# Patient Record
Sex: Female | Born: 1970
Health system: Southern US, Community
[De-identification: ages and names within clinical notes are randomized; demographics above are authoritative.]

## PROBLEM LIST (undated history)

## (undated) DIAGNOSIS — C801 Malignant (primary) neoplasm, unspecified: Secondary | ICD-10-CM

## (undated) DIAGNOSIS — T7840XA Allergy, unspecified, initial encounter: Secondary | ICD-10-CM

## (undated) HISTORY — PX: TUBAL LIGATION: SHX77

## (undated) HISTORY — DX: Allergy, unspecified, initial encounter: T78.40XA

---

## 1999-03-11 ENCOUNTER — Ambulatory Visit (HOSPITAL_COMMUNITY): Admission: RE | Admit: 1999-03-11 | Discharge: 1999-03-11 | Payer: Self-pay | Admitting: *Deleted

## 1999-03-11 ENCOUNTER — Encounter: Payer: Self-pay | Admitting: Family Medicine

## 1999-07-15 ENCOUNTER — Emergency Department (HOSPITAL_COMMUNITY): Admission: EM | Admit: 1999-07-15 | Discharge: 1999-07-15 | Payer: Self-pay | Admitting: *Deleted

## 1999-07-15 ENCOUNTER — Encounter: Payer: Self-pay | Admitting: Emergency Medicine

## 1999-10-01 ENCOUNTER — Emergency Department (HOSPITAL_COMMUNITY): Admission: EM | Admit: 1999-10-01 | Discharge: 1999-10-01 | Payer: Self-pay | Admitting: Emergency Medicine

## 2003-06-04 ENCOUNTER — Other Ambulatory Visit: Admission: RE | Admit: 2003-06-04 | Discharge: 2003-06-04 | Payer: Self-pay | Admitting: Obstetrics and Gynecology

## 2005-08-25 ENCOUNTER — Other Ambulatory Visit: Admission: RE | Admit: 2005-08-25 | Discharge: 2005-08-25 | Payer: Self-pay | Admitting: Obstetrics and Gynecology

## 2005-09-16 ENCOUNTER — Ambulatory Visit (HOSPITAL_COMMUNITY): Admission: RE | Admit: 2005-09-16 | Discharge: 2005-09-16 | Payer: Self-pay | Admitting: Obstetrics and Gynecology

## 2005-09-16 HISTORY — PX: TUBAL LIGATION: SHX77

## 2006-05-04 ENCOUNTER — Emergency Department (HOSPITAL_COMMUNITY): Admission: EM | Admit: 2006-05-04 | Discharge: 2006-05-04 | Payer: Self-pay | Admitting: Emergency Medicine

## 2006-05-05 ENCOUNTER — Emergency Department (HOSPITAL_COMMUNITY): Admission: EM | Admit: 2006-05-05 | Discharge: 2006-05-05 | Payer: Self-pay | Admitting: Emergency Medicine

## 2007-02-15 ENCOUNTER — Ambulatory Visit (HOSPITAL_COMMUNITY): Admission: RE | Admit: 2007-02-15 | Discharge: 2007-02-17 | Payer: Self-pay | Admitting: General Surgery

## 2007-02-15 HISTORY — PX: INCISION AND DRAINAGE ABSCESS: SHX5864

## 2008-08-23 ENCOUNTER — Emergency Department (HOSPITAL_COMMUNITY): Admission: EM | Admit: 2008-08-23 | Discharge: 2008-08-23 | Payer: Self-pay | Admitting: Emergency Medicine

## 2009-04-22 ENCOUNTER — Emergency Department (HOSPITAL_COMMUNITY): Admission: EM | Admit: 2009-04-22 | Discharge: 2009-04-22 | Payer: Self-pay | Admitting: Emergency Medicine

## 2009-10-30 ENCOUNTER — Emergency Department (HOSPITAL_COMMUNITY): Admission: EM | Admit: 2009-10-30 | Discharge: 2009-10-30 | Payer: Self-pay | Admitting: Family Medicine

## 2010-01-07 ENCOUNTER — Emergency Department (HOSPITAL_COMMUNITY): Admission: EM | Admit: 2010-01-07 | Discharge: 2010-01-07 | Payer: Self-pay | Admitting: Family Medicine

## 2010-02-18 ENCOUNTER — Emergency Department (HOSPITAL_COMMUNITY): Admission: EM | Admit: 2010-02-18 | Discharge: 2010-02-18 | Payer: Self-pay | Admitting: Emergency Medicine

## 2010-04-08 ENCOUNTER — Emergency Department (HOSPITAL_COMMUNITY): Admission: EM | Admit: 2010-04-08 | Discharge: 2010-04-08 | Payer: Self-pay | Admitting: Family Medicine

## 2010-12-13 LAB — POCT RAPID STREP A (OFFICE): Streptococcus, Group A Screen (Direct): POSITIVE — AB

## 2010-12-15 LAB — POCT RAPID STREP A (OFFICE): Streptococcus, Group A Screen (Direct): POSITIVE — AB

## 2011-02-08 NOTE — Op Note (Signed)
[name], Linda Wilson              ACCOUNT NO.:  [id number]   MEDICAL RECORD NO.:  [id number]          PATIENT TYPE:  AMB   LOCATION:  SDS                          FACILITY:  [facility]   PHYSICIAN:  Ollen Gross. Vernell Morgans, [name] DATE OF BIRTH:  1971/01/15   DATE OF PROCEDURE:  02/15/2007  DATE OF DISCHARGE:                               OPERATIVE REPORT   PREOPERATIVE DIAGNOSIS:  Pubic abscess.   POSTOPERATIVE DIAGNOSIS:  Pubic abscess.   PROCEDURE:  Incision and drainage of pubic abscess.   SURGEON:  Ollen Gross. Vernell Morgans, [name]   ANESTHESIA:  General via LMA.   PROCEDURE:  After informed consent was obtained, the patient was brought  to the operating room, placed in the supine position on the operating  room table.  After induction of general anesthesia, the patient's pubic  area was prepped with Betadine and draped in the usual sterile manner.  The patient already had an opening where attempts were made to drain  this in the clinic.  This opening was probed with a Kelly clamp and the  cavity extended medially and laterally for several centimeters.  The  cavity was completely opened sharply with a 15 blade knife and Bovie  electrocautery.  Hemostasis was achieved using the Bovie electrocautery.  The wound was then irrigated with saline.  No other pockets were able to  be appreciated.  The wound was then packed with moistened Kerlix gauze  and sterile dressings were applied.  The patient tolerated the procedure  well.  At the end of the case all needle, sponge and instrument counts  were correct.  The patient was then awakened and taken to the recovery  room in stable condition.      Ollen Gross. Vernell Morgans, [name]  Electronically Signed     PST/MEDQ  D:  02/15/2007  T:  02/15/2007  Job:  [id number]

## 2011-02-11 NOTE — H&P (Signed)
Linda Wilson, Linda Wilson              ACCOUNT NO.:  [id number]   MEDICAL RECORD NO.:  [id number]          PATIENT TYPE:  AMB   LOCATION:  SDC                           FACILITY:  [facility]   PHYSICIAN:  [name], M.D.DATE OF BIRTH:  01-13-1971   DATE OF ADMISSION:  DATE OF DISCHARGE:                                HISTORY & PHYSICAL   DATE OF ADMISSION:  September 16, 2005   CHIEF COMPLAINT:  Desires surgical sterility.   HISTORY OF PRESENT ILLNESS:  This is a 40 year old female gravida 5 para 1-0-  4-1 whom I saw for an annual exam on November 30. She was having regular  light menses on Loestrin 24 oral contraceptives. At that time her exam was  normal. She expressed the desire for permanent sterility. All options were  discussed with her and she wishes to proceed with laparoscopic tubal  fulguration.   PAST OBSTETRICAL HISTORY:  Elective termination x4 and one cesarean section  at term, 8 pounds 15 ounces, for malposition.   PAST MEDICAL HISTORY:  She denies.   SURGICAL HISTORY:  Cesarean section.   ALLERGIES:  None.   CURRENT MEDICATIONS:  Loestrin 24.   GYNECOLOGICAL HISTORY:  No history of abnormal Pap smears. 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   SOCIAL HISTORY:  She is not married and denies significant alcohol, tobacco,  or drug use.   FAMILY HISTORY:  Maternal grandmother with breast cancer.   PHYSICAL EXAMINATION:  VITAL SIGNS:  She is 5 feet 8 inches, 242 pounds.  Blood pressure is 140/84, pulse is 88.  GENERAL:  This is a slightly obese female in no acute distress.  NECK:  Supple without lymphadenopathy or thyromegaly.  LUNGS:  Clear to auscultation.  HEART:  Regular rate and rhythm without murmur.  ABDOMEN:  Soft, nontender, nondistended, without palpable masses and she  does have a well-healed transverse scar.  EXTREMITIES:  Have no edema and are nontender.  PELVIC:  External genitalia has no lesions. On speculum  exam, the cervix and  vagina are normal. Pap smear was performed which has returned normal. On  bimanual exam she has an anteverted uterus which is possibly slightly  enlarged and nontender and no adnexal masses.   ASSESSMENT:  Desires surgical sterility. All nonsurgical and surgical  options of contraception have been discussed with the patient. She  understands that this is a permanent procedure with a failure rate of  approximately 1:150. She understands risks of surgery.   The plan is to admit the patient on the day of surgery for laparoscopy with  bilateral tubal fulguration.      [name], [name]  Electronically Signed     TDM/MEDQ  D:  09/15/2005  T:  09/15/2005  Job:  [id number]

## 2011-02-11 NOTE — Op Note (Signed)
Linda Wilson, UMBAUGH              ACCOUNT NO.:  [id number]   MEDICAL RECORD NO.:  [id number]          PATIENT TYPE:  AMB   LOCATION:  SDC                           FACILITY:  [facility]   PHYSICIAN:  [name], M.D.DATE OF BIRTH:  December 04, 1970   DATE OF PROCEDURE:  09/16/2005  DATE OF DISCHARGE:                                 OPERATIVE REPORT   PREOPERATIVE DIAGNOSIS:  Desires surgical sterility.   POSTOPERATIVE DIAGNOSIS:  Desires surgical sterility.   PROCEDURES:  Laparoscopic bilateral tubal fulguration.   SURGEON:  [name], [name]   ANESTHESIA:  General endotracheal tube.   ESTIMATED BLOOD LOSS:  Minimal.   COMPLICATIONS:  None.   FINDINGS:  Normal anatomy.   PROCEDURE IN DETAIL:  The patient was taken to the operating room and placed  in the dorsal supine position. General anesthesia was induced and she was  placed in mobile stirrups. Abdomen was then prepped and draped in the usual  sterile fashion, bladder drained with a Red Rubber catheter, Hulka tenaculum  applied to the cervix for uterine manipulation. Infraumbilical skin was then  infiltrated with 0.25% Marcaine and a 1.5 cm horizontal incision was made.  The Veress needle was inserted into the perineal cavity and placement  confirmed by the water drop test and an opening pressure 6 mmHg. CO2 gas was  insufflated to a pressure of 12 mmHg and the Veress needle was removed. The  size 11 disposable trocar was then introduced and placement confirmed by  laparoscope. Inspection revealed normal bowel and omentum, normal liver  edge, normal uterus, tubes, and ovaries with a couple of small fibroids on  the surface of the uterus. Tubes and ovaries were also normal. Both  fallopian tubes were identified and traced to their fimbriated ends. Three  segments of the middle portion of each tube were fulgurated with bipolar  cautery until the ampere meter read zero in all spots. This was done with  good hemostasis and  good results and bowel was carefully avoided. The  laparoscope was then removed and all gas allowed to deflate from the  abdomen. The umbilical trocar was then removed. Skin incision was closed  with interrupted subcuticular sutures of 4-0 Vicryl followed by Dermabond.  The Hulka tenaculum was then removed from the cervix. The patient was  awakened in the operating room, tolerated the procedure well, and was taken  to recovery in stable condition. Counts were correct.      [name], [name]  Electronically Signed    TDM/MEDQ  D:  09/16/2005  T:  09/17/2005  Job:  [id number]

## 2012-07-19 ENCOUNTER — Other Ambulatory Visit: Payer: Self-pay | Admitting: Family Medicine

## 2012-07-19 DIAGNOSIS — Z1231 Encounter for screening mammogram for malignant neoplasm of breast: Secondary | ICD-10-CM

## 2012-07-31 ENCOUNTER — Ambulatory Visit
Admission: RE | Admit: 2012-07-31 | Discharge: 2012-07-31 | Disposition: A | Discharge status: Home / Self Care | Payer: 59 | Source: Ambulatory Visit | Attending: Family Medicine | Admitting: Family Medicine

## 2012-07-31 DIAGNOSIS — Z1231 Encounter for screening mammogram for malignant neoplasm of breast: Secondary | ICD-10-CM

## 2012-08-02 ENCOUNTER — Other Ambulatory Visit: Payer: Self-pay | Admitting: Family Medicine

## 2012-08-02 DIAGNOSIS — R928 Other abnormal and inconclusive findings on diagnostic imaging of breast: Secondary | ICD-10-CM

## 2012-08-09 ENCOUNTER — Ambulatory Visit
Admission: RE | Admit: 2012-08-09 | Discharge: 2012-08-09 | Disposition: A | Discharge status: Home / Self Care | Payer: 59 | Source: Ambulatory Visit | Attending: Family Medicine | Admitting: Family Medicine

## 2012-08-09 DIAGNOSIS — R928 Other abnormal and inconclusive findings on diagnostic imaging of breast: Secondary | ICD-10-CM

## 2013-03-07 ENCOUNTER — Emergency Department (HOSPITAL_COMMUNITY)
Admission: EM | Admit: 2013-03-07 | Discharge: 2013-03-07 | Disposition: A | Discharge status: Home / Self Care | Payer: 59 | Source: Home / Self Care | Attending: Emergency Medicine | Admitting: Emergency Medicine

## 2013-03-07 ENCOUNTER — Emergency Department (INDEPENDENT_AMBULATORY_CARE_PROVIDER_SITE_OTHER): Payer: 59

## 2013-03-07 ENCOUNTER — Encounter (HOSPITAL_COMMUNITY): Payer: Self-pay | Admitting: Emergency Medicine

## 2013-03-07 DIAGNOSIS — M79609 Pain in unspecified limb: Secondary | ICD-10-CM

## 2013-03-07 DIAGNOSIS — M79672 Pain in left foot: Secondary | ICD-10-CM

## 2013-03-07 LAB — URIC ACID: Uric Acid, Serum: 4.9 mg/dL (ref 2.4–7.0)

## 2013-03-07 LAB — D-DIMER, QUANTITATIVE: D-Dimer, Quant: 0.55 ug/mL-FEU — ABNORMAL HIGH (ref 0.00–0.48)

## 2013-03-07 MED ORDER — DICLOFENAC SODIUM 75 MG PO TBEC: 75.0000 mg | DELAYED_RELEASE_TABLET | Freq: Two times a day (BID) | ORAL | Status: DC

## 2013-03-07 NOTE — ED Notes (Signed)
Waiting lovenox injection

## 2013-03-07 NOTE — ED Provider Notes (Signed)
Chief Complaint:   Chief Complaint  Patient presents with  . Foot Pain    swelling and tenderness anterior side of foot since thursday denies injury.     History of Present Illness:   Linda Wilson is a 42 year old female who has a one-week history of pain in the left foot over the dorsum of the foot. She denies any injury but thinks she may have stubbed her big toe before this began. She denies any pain in the toe itself. It looks a little bit swollen and the pain radiates into the ankle. She can feel a knot in her lower pretibial surface since yesterday. The foot hurts worse when she walks, and feels better if she gets off her feet. She denies any numbness or tingling. There's no pain in the calf or thigh. She has no shortness of breath or chest pain. She denies any history of DVT or pulmonary embolism.  Review of Systems:  Other than noted above, the patient denies any of the following symptoms: Systemic:  No fevers, chills, sweats, or aches.  No fatigue or tiredness. Musculoskeletal:  No joint pain, arthritis, bursitis, swelling, back pain, or neck pain. Neurological:  No muscular weakness, paresthesias, headache, or trouble with speech or coordination.  No dizziness.  PMFSH:  Past medical history, family history, social history, meds, and allergies were reviewed.    Physical Exam:   Vital signs:  BP 157/70  Pulse 84  Temp(Src) 98.3 F (36.8 C) (Oral)  Resp 16  SpO2 100%  LMP 02/16/2013 Gen:  Alert and oriented times 3.  In no distress. Musculoskeletal: There is pain to palpation of the dorsum of foot. No swelling, redness, erythema, or heat. The ankle has a full range of motion with no pain.  There is no calf tenderness and Homans sign is negative. I can feel a small subcutaneous bump in the pretibial surface, just above the ankle. There is no erythema in this area. No palpable cord or distended veins. Otherwise, all joints had a full a ROM with no swelling, bruising or deformity.   No edema, pulses full. Extremities were warm and pink.  Capillary refill was brisk.  Skin:  Clear, warm and dry.  No rash. Neuro:  Alert and oriented times 3.  Muscle strength was normal.  Sensation was intact to light touch.   Results for orders placed during the hospital encounter of 03/07/13  URIC ACID      Result Value Range   Uric Acid, Serum 4.9  2.4 - 7.0 mg/dL  D-DIMER, QUANTITATIVE      Result Value Range   D-Dimer, Quant 0.55 (*) 0.00 - 0.48 ug/mL-FEU   Radiology:  Dg Foot Complete Left  03/07/2013   *RADIOLOGY REPORT*  Clinical Data: Left foot pain.  LEFT FOOT - COMPLETE 3+ VIEW  Comparison: None.  Findings: No acute fracture, dislocation or bony destruction is identified.  No focal bony lesions are seen.  There are some degenerative changes seen involving the tarsal bones along the dorsum of the foot at the level of the navicular and cuneiforms. No soft tissue abnormalities are seen.  IMPRESSION: No acute findings.  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   Original Report Authenticated By: Irish Lack, [name]   I reviewed the images independently and personally and concur with the radiologist's findings.  Course in Urgent Care Center:   Since her d-dimer was mildly elevated, she was given Lovenox 1 mg per kilogram and will be scheduled for tomorrow for a  venous duplex. She's to come here afterwards to get her report.  Assessment:  The encounter diagnosis was Foot pain, left.  I think this is most likely due to the arthritic changes seen on the x-ray of the foot. I don't think she has deep venous thrombophlebitis. If her venous duplex comes back negative, she was given a prescription for Voltaren to take and I told her to elevate the foot, apply ice, rest, and followup with her primary care doctor next week.  Plan:   1.  The following meds were prescribed:   Discharge Medication List as of 03/07/2013  7:06 PM    START taking these medications   Details  diclofenac (VOLTAREN)  75 MG EC tablet Take 1 tablet (75 mg total) by mouth 2 (two) times daily., Starting 03/07/2013, Until Discontinued, Normal       2.  The patient was instructed in symptomatic care, including rest and activity, elevation, application of ice and compression.  Appropriate handouts were given. 3.  The patient was told to return if becoming worse in any way, if no better in 3 or 4 days, and given some red flag symptoms such as shortness of breath or chest pain that would indicate earlier return.   4.  The patient was told to follow up with her primary care physician next week.    Reuben Likes, MD 03/07/13 2120

## 2013-03-07 NOTE — ED Notes (Signed)
Pt c/o anterior swelling and tenderness of the left foot. Denies injury.  Pt has used heat and ice along with elevation with no relief in symptoms.

## 2013-03-08 ENCOUNTER — Ambulatory Visit (HOSPITAL_COMMUNITY)
Admission: RE | Admit: 2013-03-08 | Discharge: 2013-03-08 | Disposition: A | Discharge status: Home / Self Care | Payer: 59 | Source: Ambulatory Visit | Attending: Emergency Medicine | Admitting: Emergency Medicine

## 2013-03-08 ENCOUNTER — Encounter: Payer: Self-pay | Admitting: General Practice

## 2013-03-08 ENCOUNTER — Ambulatory Visit (INDEPENDENT_AMBULATORY_CARE_PROVIDER_SITE_OTHER): Payer: 59 | Admitting: General Practice

## 2013-03-08 VITALS — BP 148/86 | HR 76 | Temp 96.9°F | Ht 69.0 in | Wt 285.0 lb

## 2013-03-08 DIAGNOSIS — M7989 Other specified soft tissue disorders: Secondary | ICD-10-CM | POA: Insufficient documentation

## 2013-03-08 DIAGNOSIS — R600 Localized edema: Secondary | ICD-10-CM

## 2013-03-08 DIAGNOSIS — R609 Edema, unspecified: Secondary | ICD-10-CM

## 2013-03-08 DIAGNOSIS — M79609 Pain in unspecified limb: Secondary | ICD-10-CM

## 2013-03-08 NOTE — Progress Notes (Signed)
VASCULAR LAB PRELIMINARY  PRELIMINARY  PRELIMINARY  PRELIMINARY  Left lower extremity venous duplex completed.    Preliminary report:  Left:  No evidence of DVT, superficial thrombosis, or Baker's cyst.  There is localized interstitial fluid at the area of swelling on the shin.   Sharena Dibenedetto, RVT 03/08/2013, 8:42 AM

## 2013-03-08 NOTE — Progress Notes (Signed)
  Subjective:    Patient ID: Linda Wilson, female    DOB: 1970/11/15, 42 y.o.   MRN: [id number]  HPI Patient presents today with left lower leg swelling. Reports onset was a few days ago. Went to urgent care lastnight and doppler was suggested. Doppler study performed this am (negative). Left foot xray, negative.  Denies known trauma to this area. Reports hitting her leg in same area one year ago and had leg swelling. She reports pain if she applies pressure, but none otherwise. Reports taking ibuprofen OTC twice.     Review of Systems  Constitutional: Negative for fever and chills.  Respiratory: Negative for chest tightness, shortness of breath and wheezing.   Cardiovascular: Positive for leg swelling. Negative for chest pain and palpitations.  Genitourinary: Negative for difficulty urinating and pelvic pain.  Musculoskeletal: Negative for myalgias.  Skin: Negative for rash.  Neurological: Negative for dizziness, weakness and headaches.       Objective:   Physical Exam  Constitutional: She is oriented to person, place, and time. She appears well-developed and well-nourished.  Cardiovascular: Normal rate, regular rhythm and normal heart sounds.   Pulses:      Dorsalis pedis pulses are 2+ on the right side, and 2+ on the left side.  2 + pitting edema noted to left foot, capillary refill less than 3 seconds  Pulmonary/Chest: Effort normal and breath sounds normal. No respiratory distress. She exhibits no tenderness.  Musculoskeletal: She exhibits edema and tenderness.  Tenderness noted to top of left foot and lower leg upon palpation,   Neurological: She is alert and oriented to person, place, and time.  Skin: Skin is warm and dry.  Psychiatric: She has a normal mood and affect.          Assessment & Plan:  1. Leg edema, left -referral to evaluate lymphedema -elevate left extremity while sitting -RTO if symptoms worsen -call 911 is shortness of breath develops -Patient  verbalized understanding -Coralie Keens, FNP-C

## 2013-03-11 ENCOUNTER — Other Ambulatory Visit: Payer: Self-pay | Admitting: General Practice

## 2013-03-11 DIAGNOSIS — R600 Localized edema: Secondary | ICD-10-CM

## 2013-04-01 ENCOUNTER — Ambulatory Visit: Payer: 59 | Admitting: General Practice

## 2014-06-13 ENCOUNTER — Telehealth: Payer: Self-pay | Admitting: Family Medicine

## 2014-06-13 NOTE — Telephone Encounter (Signed)
appt given for in the am with bill

## 2014-06-14 ENCOUNTER — Ambulatory Visit (INDEPENDENT_AMBULATORY_CARE_PROVIDER_SITE_OTHER): Payer: 59 | Admitting: General Practice

## 2014-06-14 VITALS — BP 129/72 | HR 113 | Temp 97.4°F | Ht 69.0 in | Wt 282.0 lb

## 2014-06-14 DIAGNOSIS — L0501 Pilonidal cyst with abscess: Secondary | ICD-10-CM

## 2014-06-14 MED ORDER — SULFAMETHOXAZOLE-TMP DS 800-160 MG PO TABS
1.0000 | ORAL_TABLET | Freq: Two times a day (BID) | ORAL | Status: DC
Start: 2014-06-14 — End: 2014-08-11

## 2014-06-14 NOTE — Patient Instructions (Addendum)
Pilonidal Cyst A pilonidal cyst occurs when hairs get trapped (ingrown) beneath the skin in the crease between the buttocks over your sacrum (the bone under that crease). Pilonidal cysts are most common in young men with a lot of body hair. When the cyst is ruptured (breaks) or leaking, fluid from the cyst may cause burning and itching. If the cyst becomes infected, it causes a painful swelling filled with pus (abscess). The pus and trapped hairs need to be removed (often by lancing) so that the infection can heal. However, recurrence is common and an operation may be needed to remove the cyst. HOME CARE INSTRUCTIONS   If the cyst was NOT INFECTED:  Keep the area clean and dry. Bathe or shower daily. Wash the area well with a germ-killing soap. Warm tub baths may help prevent infection and help with drainage. Dry the area well with a towel.  Avoid tight clothing to keep area as moisture free as possible.  Keep area between buttocks as free of hair as possible. A depilatory may be used.  If the cyst WAS INFECTED and needed to be drained:  Your caregiver packed the wound with gauze to keep the wound open. This allows the wound to heal from the inside outwards and continue draining.  Return for a wound check in 1 day or as suggested.  If you take tub baths or showers, repack the wound with gauze following them. Sponge baths (at the sink) are a good alternative.  If an antibiotic was ordered to fight the infection, take as directed.  Only take over-the-counter or prescription medicines for pain, discomfort, or fever as directed by your caregiver.  After the drain is removed, use sitz baths for 20 minutes 4 times per day. Clean the wound gently with mild unscented soap, pat dry, and then apply a dry dressing. SEEK MEDICAL CARE IF:   You have increased pain, swelling, redness, drainage, or bleeding from the area.  You have a fever.  You have muscles aches, dizziness, or a general ill  feeling. Document Released: 09/09/2000 Document Revised: 12/05/2011 Document Reviewed: 11/07/2008 Exit[employer] Patient Information 2015 [employer]. This information is not intended to replace advice given to you by your health care provider. Make sure you discuss any questions you have with your health care provider.  

## 2014-06-14 NOTE — Progress Notes (Signed)
   Subjective:    Patient ID: Linda Wilson, female    DOB: 1971-08-14, 43 y.o.   MRN: [id number]  HPI Patient presents today with complaints of raised sore, area on mid upper buttocks. Denies drainage. Warm to touch. Onset Wednesday and gradually worsened. Associated symptoms fever, maximum 102.1 for past 2 days. Ibuprofen taken daily and fever resolved. No improvements with pain, rated at 7 on 1-10 scale. Denies similar symptoms in the past.  Reports last menstrual cycle began 2 weeks ago and was normal.    Review of Systems  Constitutional: Positive for fever and chills.  Respiratory: Negative for chest tightness and shortness of breath.   Cardiovascular: Negative for chest pain and palpitations.  Skin:       Raised, tender area to mid upper buttocks.       Objective:   Physical Exam  Constitutional: She is oriented to person, place, and time. She appears well-developed and well-nourished.  Cardiovascular: Regular rhythm and normal heart sounds.  Tachycardia present.   Pulmonary/Chest: Effort normal and breath sounds normal.  Neurological: She is alert and oriented to person, place, and time.  Skin: Skin is warm.  Mild erythematous, tender area noted to left upper buttocks. Warm to touch. Area measures 1/2 inch x 1/2 inch. Negative drainage, but firm with whitish colored pin point head.   Psychiatric: She has a normal mood and affect.          Assessment & Plan:  1. Pilonidal abscess  - sulfamethoxazole-trimethoprim (BACTRIM DS) 800-160 MG per tablet; Take 1 tablet by mouth 2 (two) times daily.  Dispense: 20 tablet; Refill: 0 -Discussed and provided patient education on pilonidal abscess -RTO on Monday 9/21 for follow up, may seek emergency treatment if symptoms worsen prior to follow up -Patient verbalized understanding Erby Pian, FNP-C

## 2014-06-16 ENCOUNTER — Ambulatory Visit (INDEPENDENT_AMBULATORY_CARE_PROVIDER_SITE_OTHER): Payer: 59 | Admitting: Nurse Practitioner

## 2014-06-16 ENCOUNTER — Encounter: Payer: Self-pay | Admitting: Nurse Practitioner

## 2014-06-16 VITALS — BP 154/97 | HR 116 | Temp 99.7°F | Ht 69.0 in | Wt 283.2 lb

## 2014-06-16 DIAGNOSIS — L0501 Pilonidal cyst with abscess: Secondary | ICD-10-CM

## 2014-06-16 MED ORDER — CEFTRIAXONE SODIUM 1 G IJ SOLR
1.0000 g | INTRAMUSCULAR | Status: AC
  Administered 2014-06-16: 1 g via INTRAMUSCULAR

## 2014-06-16 MED ORDER — HYDROCODONE-ACETAMINOPHEN 5-325 MG PO TABS: 1.0000 | ORAL_TABLET | Freq: Four times a day (QID) | ORAL | Status: DC | PRN

## 2014-06-16 NOTE — Progress Notes (Signed)
   Subjective:    Patient ID: Linda Wilson, female    DOB: [birth year], 43 y.o.   MRN: [id number]  HPI Patient is here today for an abscess follow up. She was seen on Saturday and was prescribe antibiotic. She stated that she believed the abscess ruptured last night. She is still taking her antibiotic medication. She reported pain and discomfort.     Review of Systems  Constitutional: Negative.   HENT: Negative.   Eyes: Negative.   Respiratory: Negative.   Cardiovascular: Negative.   Gastrointestinal: Negative.   Endocrine: Negative.   Genitourinary: Negative.   Musculoskeletal: Negative.   Skin:       pilonidal abscess.   Allergic/Immunologic: Negative.   Hematological: Negative.   Psychiatric/Behavioral: Negative.        Objective:   Physical Exam  Constitutional: She is oriented to person, place, and time. She appears well-developed and well-nourished.  HENT:  Head: Normocephalic.  Eyes: Pupils are equal, round, and reactive to light.  Neck: Normal range of motion.  Cardiovascular: Normal rate.   Pulmonary/Chest: Effort normal.  Abdominal: Soft.  Musculoskeletal: Normal range of motion.  Neurological: She is alert and oriented to person, place, and time.  Skin: Skin is warm and dry.  Psychiatric: She has a normal mood and affect.    BP 154/97  Pulse 116  Temp(Src) 99.7 F (37.6 C) (Oral)  Ht 5\' 9"  (1.753 m)  Wt 283 lb 3.2 oz (128.459 kg)  BMI 41.80 kg/m2       Assessment & Plan:   1. Pilonidal cyst with abscess  Meds ordered this encounter  Medications  . HYDROcodone-acetaminophen (NORCO/VICODIN) 5-325 MG per tablet    Sig: Take 1 tablet by mouth every 6 (six) hours as needed for moderate pain.    Dispense:  30 tablet    Refill:  0    Order Specific Question:  Supervising Provider    Answer:  Chipper Herb [[id number]]  . cefTRIAXone (ROCEPHIN) injection 1 g    Sig:     Order Specific Question:  Antibiotic Indication:    Answer:  Other Indication  (list below)    Order Specific Question:  Other Indication:    Answer:  pilonidal abscess    Orders Placed This Encounter  Procedures  . Anaerobic and Aerobic Culture  Continue Bactrim as rx Labs pending  Continue all meds Follow up  In office PRN if symptoms persist or worsen.    Mary-Margaret Hassell Done, FNP

## 2014-06-16 NOTE — Patient Instructions (Signed)
Pilonidal Cyst A pilonidal cyst occurs when hairs get trapped (ingrown) beneath the skin in the crease between the buttocks over your sacrum (the bone under that crease). Pilonidal cysts are most common in young men with a lot of body hair. When the cyst is ruptured (breaks) or leaking, fluid from the cyst may cause burning and itching. If the cyst becomes infected, it causes a painful swelling filled with pus (abscess). The pus and trapped hairs need to be removed (often by lancing) so that the infection can heal. However, recurrence is common and an operation may be needed to remove the cyst. HOME CARE INSTRUCTIONS   If the cyst was NOT INFECTED:  Keep the area clean and dry. Bathe or shower daily. Wash the area well with a germ-killing soap. Warm tub baths may help prevent infection and help with drainage. Dry the area well with a towel.  Avoid tight clothing to keep area as moisture free as possible.  Keep area between buttocks as free of hair as possible. A depilatory may be used.  If the cyst WAS INFECTED and needed to be drained:  Your caregiver packed the wound with gauze to keep the wound open. This allows the wound to heal from the inside outwards and continue draining.  Return for a wound check in 1 day or as suggested.  If you take tub baths or showers, repack the wound with gauze following them. Sponge baths (at the sink) are a good alternative.  If an antibiotic was ordered to fight the infection, take as directed.  Only take over-the-counter or prescription medicines for pain, discomfort, or fever as directed by your caregiver.  After the drain is removed, use sitz baths for 20 minutes 4 times per day. Clean the wound gently with mild unscented soap, pat dry, and then apply a dry dressing. SEEK MEDICAL CARE IF:   You have increased pain, swelling, redness, drainage, or bleeding from the area.  You have a fever.  You have muscles aches, dizziness, or a general ill  feeling. Document Released: 09/09/2000 Document Revised: 12/05/2011 Document Reviewed: 11/07/2008 Exit[employer] Patient Information 2015 [employer]. This information is not intended to replace advice given to you by your health care provider. Make sure you discuss any questions you have with your health care provider.  

## 2014-06-18 ENCOUNTER — Telehealth: Payer: Self-pay | Admitting: Nurse Practitioner

## 2014-06-18 NOTE — Telephone Encounter (Signed)
Culture results aren't back yet. These usually take several days to result.  She should continue to use warm soaks. Work note authorized by Chevis Pretty, FNP. Patient will f/u as needed.

## 2014-06-20 LAB — ANAEROBIC AND AEROBIC CULTURE

## 2014-06-23 ENCOUNTER — Telehealth: Payer: Self-pay | Admitting: Family Medicine

## 2014-06-23 NOTE — Telephone Encounter (Signed)
Message copied by Waverly Ferrari on Mon Jun 23, 2014 12:44 PM ------      Message from: Chevis Pretty      Created: Fri Jun 20, 2014  3:35 PM       Treated with bactrim and rocephin- should get better ------

## 2014-08-11 ENCOUNTER — Encounter: Payer: Self-pay | Admitting: Family Medicine

## 2014-08-11 ENCOUNTER — Ambulatory Visit (INDEPENDENT_AMBULATORY_CARE_PROVIDER_SITE_OTHER): Payer: 59 | Admitting: Family Medicine

## 2014-08-11 VITALS — BP 185/96 | HR 89 | Temp 98.0°F | Ht 69.0 in | Wt 287.0 lb

## 2014-08-11 DIAGNOSIS — J206 Acute bronchitis due to rhinovirus: Secondary | ICD-10-CM

## 2014-08-11 MED ORDER — LEVALBUTEROL HCL 1.25 MG/3ML IN NEBU
1.2500 mg | INHALATION_SOLUTION | Freq: Once | RESPIRATORY_TRACT | Status: AC
  Administered 2014-08-11: 1.25 mg via RESPIRATORY_TRACT

## 2014-08-11 MED ORDER — AZITHROMYCIN 250 MG PO TABS: ORAL_TABLET | ORAL | Status: DC

## 2014-08-11 MED ORDER — METHYLPREDNISOLONE (PAK) 4 MG PO TABS: ORAL_TABLET | ORAL | Status: DC

## 2014-08-11 MED ORDER — METHYLPREDNISOLONE ACETATE 80 MG/ML IJ SUSP
80.0000 mg | Freq: Once | INTRAMUSCULAR | Status: AC
  Administered 2014-08-11: 80 mg via INTRAMUSCULAR

## 2014-08-11 MED ORDER — HYDROCODONE-HOMATROPINE 5-1.5 MG/5ML PO SYRP: 5.0000 mL | ORAL_SOLUTION | Freq: Three times a day (TID) | ORAL | Status: DC | PRN

## 2014-08-11 NOTE — Addendum Note (Signed)
Addended by: Marin Olp on: 08/11/2014 04:17 PM   Modules accepted: Orders, SmartSet

## 2014-08-11 NOTE — Addendum Note (Signed)
Addended by: Lysbeth Penner on: 08/11/2014 12:50 PM   Modules accepted: Orders, SmartSet

## 2014-08-11 NOTE — Progress Notes (Signed)
   Subjective:    Patient ID: Linda Wilson, female    DOB: [birth year], 43 y.o.   MRN: [id number]  HPI Patient is here with c/o uri sx's and cough for over a week.  She is congested and having difficulty sleeping due to cough.  Review of Systems  Constitutional: Negative for fever.  HENT: Negative for ear pain.   Eyes: Negative for discharge.  Respiratory: Negative for cough.   Cardiovascular: Negative for chest pain.  Gastrointestinal: Negative for abdominal distention.  Endocrine: Negative for polyuria.  Genitourinary: Negative for difficulty urinating.  Musculoskeletal: Negative for gait problem and neck pain.  Skin: Negative for color change and rash.  Neurological: Negative for speech difficulty and headaches.  Psychiatric/Behavioral: Negative for agitation.       Objective:    BP 185/96 mmHg  Pulse 89  Temp(Src) 98 F (36.7 C) (Oral)  Ht 5\' 9"  (1.753 m)  Wt 287 lb (130.182 kg)  BMI 42.36 kg/m2  LMP 07/20/2014   Physical Exam  Constitutional: She is oriented to person, place, and time. She appears well-developed and well-nourished.  HENT:  Head: Normocephalic and atraumatic.  Mouth/Throat: Oropharynx is clear and moist.  Eyes: Pupils are equal, round, and reactive to light.  Neck: Normal range of motion. Neck supple.  Cardiovascular: Normal rate and regular rhythm.   No murmur heard. Pulmonary/Chest: Effort normal and breath sounds normal.  Abdominal: Soft. Bowel sounds are normal. There is no tenderness.  Neurological: She is alert and oriented to person, place, and time.  Skin: Skin is warm and dry.  Psychiatric: She has a normal mood and affect.          Assessment & Plan:     ICD-9-CM ICD-10-CM   1. Acute bronchitis due to Rhinovirus 466.0 J20.6 azithromycin (ZITHROMAX) 250 MG tablet   079.3  HYDROcodone-homatropine (HYCODAN) 5-1.5 MG/5ML syrup     methylPREDNISolone acetate (DEPO-MEDROL) injection 80 mg     Return if symptoms worsen or fail  to improve.  Lysbeth Penner FNP

## 2014-11-18 ENCOUNTER — Emergency Department (HOSPITAL_COMMUNITY)
Admission: EM | Admit: 2014-11-18 | Discharge: 2014-11-18 | Disposition: A | Discharge status: Home / Self Care | Payer: 59 | Source: Home / Self Care

## 2014-11-18 ENCOUNTER — Encounter (HOSPITAL_COMMUNITY): Payer: Self-pay | Admitting: Family Medicine

## 2014-11-18 DIAGNOSIS — S46811A Strain of other muscles, fascia and tendons at shoulder and upper arm level, right arm, initial encounter: Secondary | ICD-10-CM

## 2014-11-18 DIAGNOSIS — I10 Essential (primary) hypertension: Secondary | ICD-10-CM

## 2014-11-18 MED ORDER — METHOCARBAMOL 500 MG PO TABS: 500.0000 mg | ORAL_TABLET | Freq: Four times a day (QID) | ORAL | Status: DC | PRN

## 2014-11-18 MED ORDER — DICLOFENAC SODIUM 75 MG PO TBEC: 75.0000 mg | DELAYED_RELEASE_TABLET | Freq: Two times a day (BID) | ORAL | Status: DC

## 2014-11-18 NOTE — Discharge Instructions (Signed)
You have likely strained or developed a spasm of your trapezius muscle This will take time to resolve Please stay active and let pain be your guide. Please use the Voltaren and Robaxin for relief. Robaxin may make you tired. Please seek immediate medical attention if your symptoms get significantly worse.
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
## 2014-11-21 ENCOUNTER — Ambulatory Visit (INDEPENDENT_AMBULATORY_CARE_PROVIDER_SITE_OTHER): Payer: 59 | Admitting: Physician Assistant

## 2014-11-21 ENCOUNTER — Encounter: Payer: Self-pay | Admitting: Physician Assistant

## 2014-11-21 DIAGNOSIS — J309 Allergic rhinitis, unspecified: Secondary | ICD-10-CM

## 2014-11-21 DIAGNOSIS — M25511 Pain in right shoulder: Secondary | ICD-10-CM

## 2014-11-21 MED ORDER — DICLOFENAC EPOLAMINE 1.3 % TD PTCH: 1.0000 | MEDICATED_PATCH | Freq: Two times a day (BID) | TRANSDERMAL | Status: DC

## 2014-11-21 MED ORDER — FLUTICASONE PROPIONATE 50 MCG/ACT NA SUSP: 2.0000 | Freq: Every day | NASAL | Status: DC
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
## 2014-11-21 NOTE — Patient Instructions (Addendum)
RICE: Routine Care for Injuries Rest, Ice, Compression, and Elevation (RICE) are often used to care for injuries. HOME CARE  Rest your injury.  Put ice on the injury.  Put ice in a plastic bag.  Place a towel between your skin and the bag.  Leave the ice on for 15-20 minutes, 03-04 times a day. Do this for as long as told by your doctor.  Apply pressure (compression) with an elastic bandage. Remove and reapply the bandage every 3 to 4 hours. Do not wrap the bandage too tight. Wrap the bandage looser if the fingers or toes are puffy (swollen), blue, cold, painful, or lose feeling (numb).  Raise (elevate) your injury. Raise your injury above the heart if you can. GET HELP RIGHT AWAY IF:  You have lasting pain or puffiness.  Your injury is red, weak, or loses feeling.  Your problems get worse, not better, after several days. MAKE SURE YOU:  Understand these instructions.  Will watch your condition.  Will get help right away if you are not doing well or get worse. Document Released: 02/29/2008 Document Revised: 12/05/2011 Document Reviewed: 02/11/2011 [facility] Patient Information 2015 [place], [place]. This information is not intended to replace advice given to you by your health care provider. Make sure you discuss any questions you have with your health care provider. Muscle Pain Muscle pain (myalgia) may be caused by many things, including:  Overuse or muscle strain, especially if you are not in shape. This is the most common cause of muscle pain.  Injury.  Bruises.  Viruses, such as the flu.  Infectious diseases.  Fibromyalgia, which is a chronic condition that causes muscle tenderness, fatigue, and headache.  Autoimmune diseases, including lupus.  Certain drugs, including ACE inhibitors and statins. Muscle pain may be mild or severe. In most cases, the pain lasts only a short time and goes away without treatment. To diagnose the cause of your muscle pain, your  health care provider will take your medical history. This means he or she will ask you when your muscle pain began and what has been happening. If you have not had muscle pain for very long, your health care provider may want to wait before doing much testing. If your muscle pain has lasted a long time, your health care provider may want to run tests right away. If your health care provider thinks your muscle pain may be caused by illness, you may need to have additional tests to rule out certain conditions.  Treatment for muscle pain depends on the cause. Home care is often enough to relieve muscle pain. Your health care provider may also prescribe anti-inflammatory medicine. HOME CARE INSTRUCTIONS Watch your condition for any changes. The following actions may help to lessen any discomfort you are feeling:  Only take over-the-counter or prescription medicines as directed by your health care provider.  Apply ice to the sore muscle:  Put ice in a plastic bag.  Place a towel between your skin and the bag.  Leave the ice on for 15-20 minutes, 3-4 times a day.  You may alternate applying hot and cold packs to the muscle as directed by your health care provider.  If overuse is causing your muscle pain, slow down your activities until the pain goes away.  Remember that it is normal to feel some muscle pain after starting a workout program. Muscles that have not been used often will be sore at first.  Do regular, gentle exercises if you are not usually  active.  Warm up before exercising to lower your risk of muscle pain.  Do not continue working out if the pain is very bad. Bad pain could mean you have injured a muscle. SEEK MEDICAL CARE IF:  Your muscle pain gets worse, and medicines do not help.  You have muscle pain that lasts longer than 3 days.  You have a rash or fever along with muscle pain.  You have muscle pain after a tick bite.  You have muscle pain while working out, even  though you are in good physical condition.  You have redness, soreness, or swelling along with muscle pain.  You have muscle pain after starting a new medicine or changing the dose of a medicine. SEEK IMMEDIATE MEDICAL CARE IF:  You have trouble breathing.  You have trouble swallowing.  You have muscle pain along with a stiff neck, fever, and vomiting.  You have severe muscle weakness or cannot move part of your body. MAKE SURE YOU:   Understand these instructions.  Will watch your condition.  Will get help right away if you are not doing well or get worse. Document Released: 08/04/2006 Document Revised: 09/17/2013 Document Reviewed: 07/09/2013 [facility] Patient Information 2015 [place], [place]. This information is not intended to replace advice given to you by your health care provider. Make sure you discuss any questions you have with your health care provider.

## 2014-11-24 ENCOUNTER — Other Ambulatory Visit: Payer: Self-pay | Admitting: Nurse Practitioner

## 2014-11-24 ENCOUNTER — Emergency Department (HOSPITAL_COMMUNITY)
Admission: EM | Admit: 2014-11-24 | Discharge: 2014-11-24 | Disposition: A | Discharge status: Home / Self Care | Payer: 59 | Source: Home / Self Care | Attending: Family Medicine | Admitting: Family Medicine

## 2014-11-24 ENCOUNTER — Encounter (HOSPITAL_COMMUNITY): Payer: Self-pay

## 2014-11-24 DIAGNOSIS — S161XXD Strain of muscle, fascia and tendon at neck level, subsequent encounter: Secondary | ICD-10-CM

## 2014-11-24 MED ORDER — PREDNISONE 10 MG PO TABS: ORAL_TABLET | ORAL | Status: DC

## 2014-11-24 MED ORDER — HYDROCODONE-ACETAMINOPHEN 5-325 MG PO TABS: 2.0000 | ORAL_TABLET | ORAL | Status: DC | PRN

## 2014-11-24 NOTE — ED Provider Notes (Signed)
CSN: [id number]     Arrival date & time 11/24/14  0935 History   First MD Initiated Contact with Patient 11/24/14 1042     Chief Complaint  Patient presents with  . Neck Pain   (Consider location/radiation/quality/duration/timing/severity/associated sxs/prior Treatment) Patient is a 44 y.o. female presenting with neck pain. The history is provided by the patient. No language interpreter was used.  Neck Pain Pain location:  Generalized neck Quality:  Aching and cramping Pain radiates to:  R shoulder and R scapula Pain severity:  Moderate Pain is:  Same all the time Onset quality:  Gradual Duration:  6 days Timing:  Constant Progression:  Worsening Chronicity:  New Context: recent injury   Relieved by:  Nothing Worsened by:  Nothing tried Ineffective treatments:  NSAIDs Associated symptoms: tingling   Associated symptoms: no bladder incontinence   Risk factors: no hx of head and neck radiation   Pt reports she has had pain since injury on the 23rd. Pt reports no relief with voltaren or robaxin.   History reviewed. No pertinent past medical history. Past Surgical History  Procedure Laterality Date  . Cesarean section    . Tubal ligation     Family History  Problem Relation Age of Onset  . Heart disease Father     MI   History  Substance Use Topics  . Smoking status: Never Smoker   . Smokeless tobacco: Not on file  . Alcohol Use: No   OB History    No data available     Review of Systems  Genitourinary: Negative for bladder incontinence.  Musculoskeletal: Positive for neck pain.  Neurological: Positive for tingling.  All other systems reviewed and are negative.   Allergies  Review of patient's allergies indicates no known allergies.  Home Medications   Prior to Admission medications   Medication Sig Start Date End Date Taking? Authorizing Provider  diclofenac (FLECTOR) 1.3 % PT[facility] Place 1 patch onto the skin 2 (two) times daily. 11/21/14   Marline Backbone, PA-C   diclofenac (VOLTAREN) 75 MG EC tablet Take 1 tablet (75 mg total) by mouth 2 (two) times daily. 11/18/14   Waldemar Dickens, MD  fluticasone (FLONASE) 50 MCG/ACT nasal spray Place 2 sprays into both nostrils daily. 11/21/14   Marline Backbone, PA-C  methocarbamol (ROBAXIN) 500 MG tablet Take 1-2 tablets (500-1,000 mg total) by mouth every 6 (six) hours as needed for muscle spasms. 11/18/14   Waldemar Dickens, MD   BP 166/80 mmHg  Pulse 89  Temp(Src) 98.2 F (36.8 C) (Oral)  Resp 14  SpO2 98%  LMP 11/09/2014 Physical Exam  Constitutional: She is oriented to person, place, and time. She appears well-developed and well-nourished.  HENT:  Head: Normocephalic and atraumatic.  Right Ear: External ear normal.  Mouth/Throat: Oropharynx is clear and moist.  Eyes: Conjunctivae and EOM are normal. Pupils are equal, round, and reactive to light.  Neck: Normal range of motion.  Cardiovascular: Normal rate and normal heart sounds.   Pulmonary/Chest: Effort normal.  Abdominal: Soft. She exhibits no distension.  Musculoskeletal:  Tender lower cervical spine,  Trapezius and anterior shoulder below clavicle  From  nv and ns intact  Neurological: She is alert and oriented to person, place, and time.  Skin: Skin is warm.  Psychiatric: She has a normal mood and affect.  Nursing note and vitals reviewed.   ED Course  Procedures (including critical care time) Labs Review Labs Reviewed - No data to display  Imaging Review  No results found.   MDM  Pt also had a trigger point injection by her MD with no relief.   1. Cervical strain, acute, subsequent encounter    Prednisone taper Hydrocodone Schedule to see Dr. Rush Farmer Orthopaedist for evaluation    Fransico Meadow, PA-C 11/24/14 1113

## 2014-11-24 NOTE — Discharge Instructions (Signed)

## 2014-11-24 NOTE — ED Notes (Signed)
C/o continued pain in right side of neck, shoulder from 2-23 visit

## 2015-10-08 ENCOUNTER — Other Ambulatory Visit: Payer: Self-pay | Admitting: Physician Assistant

## 2015-10-09 ENCOUNTER — Other Ambulatory Visit: Payer: Self-pay | Admitting: Physician Assistant

## 2015-10-12 DIAGNOSIS — D485 Neoplasm of uncertain behavior of skin: Secondary | ICD-10-CM | POA: Diagnosis not present

## 2015-10-12 DIAGNOSIS — C44519 Basal cell carcinoma of skin of other part of trunk: Secondary | ICD-10-CM | POA: Diagnosis not present

## 2015-10-12 DIAGNOSIS — L309 Dermatitis, unspecified: Secondary | ICD-10-CM | POA: Diagnosis not present

## 2015-10-12 MED FILL — FLUOCINONIDE 0.05% CREAM: 0.05 | 14 days supply | Qty: 90 | Fill #0

## 2015-10-14 ENCOUNTER — Other Ambulatory Visit: Payer: Self-pay | Admitting: Physician Assistant

## 2015-10-15 MED FILL — FLUTICASONE PROP 50 MCG SPR: 50 | 30 days supply | Qty: 16 | Fill #0

## 2015-10-15 NOTE — Telephone Encounter (Signed)
Last seen 11/21/14  Tiffany

## 2015-10-26 ENCOUNTER — Encounter: Payer: Self-pay | Admitting: Family

## 2015-10-26 ENCOUNTER — Ambulatory Visit (INDEPENDENT_AMBULATORY_CARE_PROVIDER_SITE_OTHER): Payer: 59 | Admitting: Family

## 2015-10-26 VITALS — BP 139/83 | HR 85 | Temp 98.5°F | Ht 69.0 in | Wt 276.0 lb

## 2015-10-26 DIAGNOSIS — J029 Acute pharyngitis, unspecified: Secondary | ICD-10-CM

## 2015-10-26 DIAGNOSIS — J069 Acute upper respiratory infection, unspecified: Secondary | ICD-10-CM | POA: Diagnosis not present

## 2015-10-26 LAB — POCT RAPID STREP A (OFFICE): Rapid Strep A Screen: NEGATIVE

## 2015-10-26 LAB — POCT INFLUENZA A/B
Influenza A, POC: NEGATIVE
Influenza B, POC: NEGATIVE

## 2015-10-26 MED ORDER — PREDNISONE 10 MG (21) PO TBPK: 10.0000 mg | ORAL_TABLET | Freq: Every day | ORAL | Status: DC

## 2015-10-26 MED FILL — predniSONE 10 MG (21) TBPK: 10 | 6 days supply | Qty: 21 | Fill #0

## 2015-10-26 NOTE — Patient Instructions (Addendum)
Upper Respiratory Infection, Adult Most upper respiratory infections (URIs) are a viral infection of the air passages leading to the lungs. A URI affects the nose, throat, and upper air passages. The most common type of URI is nasopharyngitis and is typically referred to as "the common cold." URIs run their course and usually go away on their own. Most of the time, a URI does not require medical attention, but sometimes a bacterial infection in the upper airways can follow a viral infection. This is called a secondary infection. Sinus and middle ear infections are common types of secondary upper respiratory infections. Bacterial pneumonia can also complicate a URI. A URI can worsen asthma and chronic obstructive pulmonary disease (COPD). Sometimes, these complications can require emergency medical care and may be life threatening.  CAUSES Almost all URIs are caused by viruses. A virus is a type of germ and can spread from one person to another.  RISKS FACTORS You may be at risk for a URI if:   You smoke.   You have chronic heart or lung disease.  You have a weakened defense (immune) system.   You are very young or very old.   You have nasal allergies or asthma.  You work in crowded or poorly ventilated areas.  You work in health care facilities or schools. SIGNS AND SYMPTOMS  Symptoms typically develop 2-3 days after you come in contact with a cold virus. Most viral URIs last 7-10 days. However, viral URIs from the influenza virus (flu virus) can last 14-18 days and are typically more severe. Symptoms may include:   Runny or stuffy (congested) nose.   Sneezing.   Cough.   Sore throat.   Headache.   Fatigue.   Fever.   Loss of appetite.   Pain in your forehead, behind your eyes, and over your cheekbones (sinus pain).  Muscle aches.  DIAGNOSIS  Your health care provider may diagnose a URI by:  Physical exam.  Tests to check that your symptoms are not due to  another condition such as:  Strep throat.  Sinusitis.  Pneumonia.  Asthma. TREATMENT  A URI goes away on its own with time. It cannot be cured with medicines, but medicines may be prescribed or recommended to relieve symptoms. Medicines may help:  Reduce your fever.  Reduce your cough.  Relieve nasal congestion. HOME CARE INSTRUCTIONS   Take medicines only as directed by your health care provider.   Gargle warm saltwater or take cough drops to comfort your throat as directed by your health care provider.  Use a warm mist humidifier or inhale steam from a shower to increase air moisture. This may make it easier to breathe.  Drink enough fluid to keep your urine clear or pale yellow.   Eat soups and other clear broths and maintain good nutrition.   Rest as needed.   Return to work when your temperature has returned to normal or as your health care provider advises. You may need to stay home longer to avoid infecting others. You can also use a face mask and careful hand washing to prevent spread of the virus.  Increase the usage of your inhaler if you have asthma.   Do not use any tobacco products, including cigarettes, chewing tobacco, or electronic cigarettes. If you need help quitting, ask your health care provider. PREVENTION  The best way to protect yourself from getting a cold is to practice good hygiene.   Avoid oral or hand contact with people with cold   symptoms.   Wash your hands often if contact occurs.  There is no clear evidence that vitamin C, vitamin E, echinacea, or exercise reduces the chance of developing a cold. However, it is always recommended to get plenty of rest, exercise, and practice good nutrition.  SEEK MEDICAL CARE IF:   You are getting worse rather than better.   Your symptoms are not controlled by medicine.   You have chills.  You have worsening shortness of breath.  You have brown or red mucus.  You have yellow or brown nasal  discharge.  You have pain in your face, especially when you bend forward.  You have a fever.  You have swollen neck glands.  You have pain while swallowing.  You have white areas in the back of your throat. SEEK IMMEDIATE MEDICAL CARE IF:   You have severe or persistent:  Headache.  Ear pain.  Sinus pain.  Chest pain.  You have chronic lung disease and any of the following:  Wheezing.  Prolonged cough.  Coughing up blood.  A change in your usual mucus.  You have a stiff neck.  You have changes in your:  Vision.  Hearing.  Thinking.  Mood. MAKE SURE YOU:   Understand these instructions.  Will watch your condition.  Will get help right away if you are not doing well or get worse.   This information is not intended to replace advice given to you by your health care provider. Make sure you discuss any questions you have with your health care provider.   Document Released: 03/08/2001 Document Revised: 01/27/2015 Document Reviewed: 12/18/2013 [employer] Interactive Patient Education 2016 [employer]  - Take meds as prescribed - Use a cool mist humidifier  -Use saline nose sprays frequently -Saline irrigations of the nose can be very helpful if done frequently.  * 4X daily for 1 week*  * Use of a nettie pot can be helpful with this. Follow directions with this* -Force fluids -For any cough or congestion  Use plain Mucinex- regular strength or max strength is fine   * Children- consult with Pharmacist for dosing -For fever or aces or pains- take tylenol or ibuprofen appropriate for age and weight.  * for fevers greater than 101 orally you may alternate ibuprofen and tylenol every  3 hours. -Throat lozenges if help -New toothbrush in 3 days   Samy Ryner, FNP  

## 2015-10-26 NOTE — Progress Notes (Signed)
Subjective:    Patient ID: Linda Wilson, female    DOB: 1971/05/07, 45 y.o.   MRN: [id number]  Sore Throat  This is a new problem. The current episode started in the past 7 days. The problem has been gradually worsening. Neither side of throat is experiencing more pain than the other. There has been no fever. The pain is at a severity of 5/10. The pain is moderate. Associated symptoms include coughing, diarrhea, ear pain, a hoarse voice, swollen glands and trouble swallowing. Pertinent negatives include no abdominal pain, congestion, drooling, ear discharge, headaches, plugged ear sensation, neck pain, shortness of breath or vomiting. She has had no exposure to strep or mono. She has tried acetaminophen for the symptoms. The treatment provided no relief.  Cough This is a new problem. The current episode started in the past 7 days. The problem has been gradually worsening. The problem occurs every few hours. The cough is non-productive. Associated symptoms include chills, ear pain, myalgias, nasal congestion, postnasal drip, rhinorrhea and a sore throat. Pertinent negatives include no chest pain, ear congestion, fever, headaches, hemoptysis, rash, shortness of breath, sweats or wheezing. Exacerbated by: smoke. Risk factors for lung disease include animal exposure (cat). She has tried OTC cough suppressant for the symptoms. The treatment provided no relief. There is no history of asthma, bronchiectasis, bronchitis, COPD, emphysema, environmental allergies or pneumonia.      Review of Systems  Constitutional: Positive for chills. Negative for fever.  HENT: Positive for ear pain, hoarse voice, postnasal drip, rhinorrhea, sore throat and trouble swallowing. Negative for congestion, drooling and ear discharge.   Eyes: Negative.   Respiratory: Positive for cough. Negative for hemoptysis, shortness of breath and wheezing.   Cardiovascular: Negative.  Negative for chest pain and palpitations.    Gastrointestinal: Positive for diarrhea. Negative for vomiting and abdominal pain.  Endocrine: Negative.   Genitourinary: Negative.   Musculoskeletal: Positive for myalgias. Negative for neck pain.  Skin: Negative for rash.  Allergic/Immunologic: Negative for environmental allergies.  Neurological: Negative.  Negative for headaches.  Hematological: Negative.   Psychiatric/Behavioral: Negative.   All other systems reviewed and are negative.      Objective:   Physical Exam  Constitutional: She is oriented to person, place, and time. She appears well-developed and well-nourished. No distress.  HENT:  Head: Normocephalic and atraumatic.  Right Ear: External ear normal.  Mouth/Throat: Oropharynx is clear and moist.  Eyes: Pupils are equal, round, and reactive to light.  Neck: Normal range of motion. Neck supple. No thyromegaly present.  Cardiovascular: Normal rate, regular rhythm, normal heart sounds and intact distal pulses.   No murmur heard. Pulmonary/Chest: Effort normal and breath sounds normal. No respiratory distress. She has no wheezes.  Abdominal: Soft. Bowel sounds are normal. She exhibits no distension. There is no tenderness.  Musculoskeletal: Normal range of motion. She exhibits no edema or tenderness.  Neurological: She is alert and oriented to person, place, and time. She has normal reflexes. No cranial nerve deficit.  Skin: Skin is warm and dry.  Psychiatric: She has a normal mood and affect. Her behavior is normal. Judgment and thought content normal.  Vitals reviewed.   BP 139/83 mmHg  Pulse 85  Temp(Src) 98.5 F (36.9 C) (Oral)  Ht 5\' 9"  (1.753 m)  Wt 276 lb (125.193 kg)  BMI 40.74 kg/m2       Assessment & Plan:  1. Sore throat - POCT rapid strep A - POCT Influenza A/B  2. Acute  upper respiratory infection -- Take meds as prescribed - Use a cool mist humidifier  -Use saline nose sprays frequently -Saline irrigations of the nose can be very helpful  if done frequently.  * 4X daily for 1 week*  * Use of a nettie pot can be helpful with this. Follow directions with this* -Force fluids -For any cough or congestion  Use plain Mucinex- regular strength or max strength is fine   * Children- consult with Pharmacist for dosing -For fever or aces or pains- take tylenol or ibuprofen appropriate for age and weight.  * for fevers greater than 101 orally you may alternate ibuprofen and tylenol every  3 hours. -Throat lozenges if help -New toothbrush in 3 days - predniSONE (STERAPRED UNI-PAK 21 TAB) 10 MG (21) TBPK tablet; Take 1 tablet (10 mg total) by mouth daily. As directed x 6 days  Dispense: 21 tablet; Refill: 0  Evelina Dun, FNP

## 2015-10-27 DIAGNOSIS — H5203 Hypermetropia, bilateral: Secondary | ICD-10-CM | POA: Diagnosis not present

## 2015-10-28 ENCOUNTER — Telehealth: Payer: Self-pay | Admitting: Family

## 2015-10-28 NOTE — Telephone Encounter (Signed)
Advised patient of dr dettinger's message. She says ok

## 2015-10-28 NOTE — Telephone Encounter (Signed)
Dr Dettinger - would you recommend anything different - got flonase and prednisone

## 2015-10-28 NOTE — Telephone Encounter (Signed)
Know I wouldn't do anything different at this point, I would just say it takes at least a few days for Flonase and prednisone to fully kick in. Continue doing them as she was just seen yesterday and the start of those yesterday, she needs to give them a little more time.

## 2015-11-13 ENCOUNTER — Other Ambulatory Visit: Payer: Self-pay | Admitting: Nurse Practitioner

## 2015-11-20 DIAGNOSIS — L905 Scar conditions and fibrosis of skin: Secondary | ICD-10-CM | POA: Diagnosis not present

## 2015-11-20 DIAGNOSIS — C44519 Basal cell carcinoma of skin of other part of trunk: Secondary | ICD-10-CM | POA: Diagnosis not present

## 2015-11-25 MED FILL — FLUTICASONE PROP 50 MCG SPR: 50 | 30 days supply | Qty: 16 | Fill #0

## 2016-01-11 MED FILL — FLUTICASONE PROP 50 MCG SPR: 50 | 30 days supply | Qty: 16 | Fill #1

## 2016-02-09 MED FILL — FLUTICASONE PROP 50 MCG SPR: 50 | 30 days supply | Qty: 16 | Fill #2

## 2016-03-10 MED FILL — FLUTICASONE PROP 50 MCG SPR: 50 | 30 days supply | Qty: 16 | Fill #3

## 2016-05-12 MED FILL — FLUTICASONE PROP 50 MCG SPR: 50 | 30 days supply | Qty: 16 | Fill #4

## 2016-06-23 MED FILL — FLUOCINONIDE 0.05% CREAM: 0.05 | 14 days supply | Qty: 90 | Fill #1

## 2016-07-13 ENCOUNTER — Other Ambulatory Visit: Payer: Self-pay | Admitting: Family

## 2016-08-09 MED FILL — FLUTICASONE PROP 50 MCG SPR: 50 | 30 days supply | Qty: 16 | Fill #0

## 2016-09-30 ENCOUNTER — Telehealth: Payer: 59 | Admitting: Family

## 2016-09-30 DIAGNOSIS — R05 Cough: Secondary | ICD-10-CM | POA: Diagnosis not present

## 2016-09-30 DIAGNOSIS — J029 Acute pharyngitis, unspecified: Secondary | ICD-10-CM | POA: Diagnosis not present

## 2016-09-30 DIAGNOSIS — R059 Cough, unspecified: Secondary | ICD-10-CM

## 2016-09-30 MED ORDER — BENZONATATE 100 MG PO CAPS: 100.0000 mg | ORAL_CAPSULE | Freq: Two times a day (BID) | ORAL | 0 refills | Status: DC | PRN

## 2016-09-30 NOTE — Progress Notes (Signed)
We are sorry that you are not feeling well.  Here is how we plan to help!  Based on what you have shared with me it looks like you have upper respiratory tract inflammation that has resulted in a significant cough.  Inflammation and infection in the upper respiratory tract is commonly called bronchitis and has four common causes:  Allergies, Viral Infections, Acid Reflux and Bacterial Infections.  Allergies, viruses and acid reflux are treated by controlling symptoms or eliminating the cause. An example might be a cough caused by taking certain blood pressure medications. You stop the cough by changing the medication. Another example might be a cough caused by acid reflux. Controlling the reflux helps control the cough.  Based on your presentation I believe you most likely have A cough due to allergies.  I recommend that you start the an over-the counter-allergy medication such as Claritin 10 mg or Zyrtec 10 mg daily.     In addition you may use A non-prescription cough medication called Robitussin DAC. Take 2 teaspoons every 8 hours or Delsym: take 2 teaspoons every 12 hours., A non-prescription cough medication called Mucinex DM: take 2 tablets every 12 hours. and A prescription cough medication called Tessalon Perles 100mg. You may take 1-2 capsules every 8 hours as needed for your cough.    USE OF BRONCHODILATOR ("RESCUE") INHALERS: There is a risk from using your bronchodilator too frequently.  The risk is that over-reliance on a medication which only relaxes the muscles surrounding the breathing tubes can reduce the effectiveness of medications prescribed to reduce swelling and congestion of the tubes themselves.  Although you feel brief relief from the bronchodilator inhaler, your asthma may actually be worsening with the tubes becoming more swollen and filled with mucus.  This can delay other crucial treatments, such as oral steroid medications. If you need to use a bronchodilator inhaler daily,  several times per day, you should discuss this with your provider.  There are probably better treatments that could be used to keep your asthma under control.     HOME CARE . Only take medications as instructed by your medical team. . Complete the entire course of an antibiotic. . Drink plenty of fluids and get plenty of rest. . Avoid close contacts especially the very young and the elderly . Cover your mouth if you cough or cough into your sleeve. . Always remember to wash your hands . A steam or ultrasonic humidifier can help congestion.   GET HELP RIGHT AWAY IF: . You develop worsening fever. . You become short of breath . You cough up blood. . Your symptoms persist after you have completed your treatment plan MAKE SURE YOU   Understand these instructions.  Will watch your condition.  Will get help right away if you are not doing well or get worse.  Your e-visit answers were reviewed by a board certified advanced clinical practitioner to complete your personal care plan.  Depending on the condition, your plan could have included both over the counter or prescription medications. If there is a problem please reply  once you have received a response from your provider. Your safety is important to us.  If you have drug allergies check your prescription carefully.    You can use MyChart to ask questions about today's visit, request a non-urgent call back, or ask for a work or school excuse for 24 hours related to this e-Visit. If it has been greater than 24 hours you will need to   follow up with your provider, or enter a new e-Visit to address those concerns. You will get an e-mail in the next two days asking about your experience.  I hope that your e-visit has been valuable and will speed your recovery. Thank you for using e-visits.   

## 2016-10-17 DIAGNOSIS — H5203 Hypermetropia, bilateral: Secondary | ICD-10-CM | POA: Diagnosis not present

## 2016-12-08 ENCOUNTER — Other Ambulatory Visit: Payer: Self-pay | Admitting: Family

## 2016-12-08 MED FILL — FLUTICASONE PROP 50 MCG SPR: 50 | 30 days supply | Qty: 16 | Fill #0

## 2017-01-19 DIAGNOSIS — J101 Influenza due to other identified influenza virus with other respiratory manifestations: Secondary | ICD-10-CM | POA: Diagnosis not present

## 2017-04-11 DIAGNOSIS — M25571 Pain in right ankle and joints of right foot: Secondary | ICD-10-CM | POA: Diagnosis not present

## 2017-08-24 ENCOUNTER — Telehealth: Payer: 59 | Admitting: Physician Assistant

## 2017-08-24 DIAGNOSIS — J069 Acute upper respiratory infection, unspecified: Secondary | ICD-10-CM

## 2017-08-24 MED ORDER — DOXYCYCLINE HYCLATE 100 MG PO CAPS: 100.0000 mg | ORAL_CAPSULE | Freq: Two times a day (BID) | ORAL | 0 refills | Status: DC

## 2017-08-24 MED FILL — DOXYCYCLINE HYCLATE 100 MG: 100 | 10 days supply | Qty: 20 | Fill #0

## 2017-08-24 NOTE — Progress Notes (Signed)

## 2017-09-11 ENCOUNTER — Telehealth: Payer: 59 | Admitting: Family

## 2017-09-11 DIAGNOSIS — B9689 Other specified bacterial agents as the cause of diseases classified elsewhere: Secondary | ICD-10-CM

## 2017-09-11 DIAGNOSIS — J028 Acute pharyngitis due to other specified organisms: Secondary | ICD-10-CM | POA: Diagnosis not present

## 2017-09-11 MED ORDER — AZITHROMYCIN 250 MG PO TABS: ORAL_TABLET | ORAL | 0 refills | Status: DC

## 2017-09-11 MED FILL — AZITHROMYCIN 250 MG TAB: 250 | 5 days supply | Qty: 6 | Fill #0

## 2017-09-11 NOTE — Progress Notes (Signed)
Thank you for the details you included in the comment boxes. Those details are very helpful in determining the best course of treatment for you and help [place] to provide the best care.  We are sorry that you are not feeling well.  Here is how we plan to help!  Based on your presentation I believe you most likely have A cough due to bacteria.  When patients have a fever and a productive cough with a change in color or increased sputum production, we are concerned about bacterial bronchitis.  If left untreated it can progress to pneumonia.  If your symptoms do not improve with your treatment plan it is important that you contact your provider.   I have prescribed Azithromyin 250 mg: two tablets now and then one tablet daily for 4 additonal days    In addition you may use A non-prescription cough medication called Mucinex DM: take 2 tablets every 12 hours.    From your responses in the eVisit questionnaire you describe inflammation in the upper respiratory tract which is causing a significant cough.  This is commonly called Bronchitis and has four common causes:    Allergies  Viral Infections  Acid Reflux  Bacterial Infection Allergies, viruses and acid reflux are treated by controlling symptoms or eliminating the cause. An example might be a cough caused by taking certain blood pressure medications. You stop the cough by changing the medication. Another example might be a cough caused by acid reflux. Controlling the reflux helps control the cough.  USE OF BRONCHODILATOR ("RESCUE") INHALERS: There is a risk from using your bronchodilator too frequently.  The risk is that over-reliance on a medication which only relaxes the muscles surrounding the breathing tubes can reduce the effectiveness of medications prescribed to reduce swelling and congestion of the tubes themselves.  Although you feel brief relief from the bronchodilator inhaler, your asthma may actually be worsening with the tubes becoming  more swollen and filled with mucus.  This can delay other crucial treatments, such as oral steroid medications. If you need to use a bronchodilator inhaler daily, several times per day, you should discuss this with your provider.  There are probably better treatments that could be used to keep your asthma under control.     HOME CARE . Only take medications as instructed by your medical team. . Complete the entire course of an antibiotic. . Drink plenty of fluids and get plenty of rest. . Avoid close contacts especially the very young and the elderly . Cover your mouth if you cough or cough into your sleeve. . Always remember to wash your hands . A steam or ultrasonic humidifier can help congestion.   GET HELP RIGHT AWAY IF: . You develop worsening fever. . You become short of breath . You cough up blood. . Your symptoms persist after you have completed your treatment plan MAKE SURE YOU   Understand these instructions.  Will watch your condition.  Will get help right away if you are not doing well or get worse.  Your e-visit answers were reviewed by a board certified advanced clinical practitioner to complete your personal care plan.  Depending on the condition, your plan could have included both over the counter or prescription medications. If there is a problem please reply  once you have received a response from your provider. Your safety is important to [place].  If you have drug allergies check your prescription carefully.    You can use MyChart to ask questions  about today's visit, request a non-urgent call back, or ask for a work or school excuse for 24 hours related to this e-Visit. If it has been greater than 24 hours you will need to follow up with your provider, or enter a new e-Visit to address those concerns. You will get an e-mail in the next two days asking about your experience.  I hope that your e-visit has been valuable and will speed your recovery. Thank you for using  e-visits.

## 2017-10-07 DIAGNOSIS — H5203 Hypermetropia, bilateral: Secondary | ICD-10-CM | POA: Diagnosis not present

## 2017-11-23 ENCOUNTER — Telehealth: Payer: 59 | Admitting: Family

## 2017-11-23 DIAGNOSIS — L258 Unspecified contact dermatitis due to other agents: Secondary | ICD-10-CM | POA: Diagnosis not present

## 2017-11-23 MED ORDER — PREDNISONE 10 MG PO TABS: 10.0000 mg | ORAL_TABLET | Freq: Every day | ORAL | 0 refills | Status: DC

## 2017-11-23 MED ORDER — HYDROXYZINE PAMOATE 25 MG PO CAPS: 25.0000 mg | ORAL_CAPSULE | Freq: Four times a day (QID) | ORAL | 0 refills | Status: DC | PRN

## 2017-11-23 MED FILL — HYDROXYZINE PAM 25 MG CAP: 25 | 7 days supply | Qty: 30 | Fill #0

## 2017-11-23 MED FILL — predniSONE 10 MG TABS: 10 | 5 days supply | Qty: 5 | Fill #0

## 2017-11-23 NOTE — Progress Notes (Signed)
Thank you for the details you included in the comment boxes. Those details are very helpful in determining the best course of treatment for you and help [place] to provide the best care. It is often difficult to determine the cause of a rash. Fortunately, the treatment in your situation is the same whether the rash is from an allergy or from contact dermatitis as the treatment for both is designed to clear up the rash by decreasing irritation/swelling/redness. See below.   E Visit for Rash  We are sorry that you are not feeling well. Here is how we plan to help!  Based on what you shared with me it looks like you have contact dermatitis.  Contact dermatitis is a skin rash caused by something that touches the skin and causes irritation or inflammation.  Your skin may be red, swollen, dry, cracked, and itch.  The rash should go away in a few days but can last a few weeks.  If you get a rash, it's important to figure out what caused it so the irritant can be avoided in the future. and I have prescribed Prednisone 10 mg daily for 5 days Along with Vistaril 25mg  by mouth every 6 hours as needed for severe itching.     HOME CARE:   Take cool showers and avoid direct sunlight.  Apply cool compress or wet dressings.  Take a bath in an oatmeal bath.  Sprinkle content of one Aveeno packet under running faucet with comfortably warm water.  Bathe for 15-20 minutes, 1-2 times daily.  Pat dry with a towel. Do not rub the rash.  Use hydrocortisone cream.  Take an antihistamine like Benadryl for widespread rashes that itch.  The adult dose of Benadryl is 25-50 mg by mouth 4 times daily.  Caution:  This type of medication may cause sleepiness.  Do not drink alcohol, drive, or operate dangerous machinery while taking antihistamines.  Do not take these medications if you have prostate enlargement.  Read package instructions thoroughly on all medications that you take.  GET HELP RIGHT AWAY IF:   Symptoms don't go  away after treatment.  Severe itching that persists.  If you rash spreads or swells.  If you rash begins to smell.  If it blisters and opens or develops a yellow-brown crust.  You develop a fever.  You have a sore throat.  You become short of breath.  MAKE SURE YOU:  Understand these instructions. Will watch your condition. Will get help right away if you are not doing well or get worse.  Thank you for choosing an e-visit. Your e-visit answers were reviewed by a board certified advanced clinical practitioner to complete your personal care plan. Depending upon the condition, your plan could have included both over the counter or prescription medications. Please review your pharmacy choice. Be sure that the pharmacy you have chosen is open so that you can pick up your prescription now.  If there is a problem you may message your provider in [place] to have the prescription routed to another pharmacy. Your safety is important to [place]. If you have drug allergies check your prescription carefully.  For the next 24 hours, you can use MyChart to ask questions about today's visit, request a non-urgent call back, or ask for a work or school excuse from your e-visit provider. You will get an email in the next two days asking about your experience. I hope that your e-visit has been valuable and will speed your recovery.

## 2017-12-05 ENCOUNTER — Telehealth: Payer: 59 | Admitting: Family

## 2017-12-05 DIAGNOSIS — R21 Rash and other nonspecific skin eruption: Secondary | ICD-10-CM

## 2017-12-05 NOTE — Progress Notes (Signed)
Based on what you shared with me it looks like you have a condition that should be evaluated in a face to face office visit.  NOTE: If you entered your credit card information for this eVisit, you will not be charged. You may see a "hold" on your card for the $30 but that hold will drop off and you will not have a charge processed.  If you are having a true medical emergency please call 911.  If you need an urgent face to face visit, [facility] has four urgent care centers for your convenience.  If you need care fast and have a high deductible or no insurance consider:   [email or web address] to reserve your spot online an avoid wait times  [facility] [address], [address] [place], [place] [address] 8 am to 8 pm Monday-Friday 10 am to 4 pm Saturday-Sunday *Across the street from [employer]  [facility]  [address] [place], [address] 8 am to 5 pm Monday-Friday * In the [facility] the [facility]   The following sites will take your  insurance:  . [facility]  [phone] Get Driving Directions Find a Provider at this Location  [address] , [place] [address] . 10 am to 8 pm Monday-Friday . 12 pm to 8 pm Saturday-Sunday   . [facility]  [phone] Get Driving Directions Find a Provider at this Location  [address], [address] [place], [place] [address] . 8 am to 8 pm Monday-Friday . 9 am to 6 pm Saturday . 11 am to 6 pm Sunday   . [facility] [place]  [phone] Get Driving Directions  [address] [place],  [address] . 8 am to 8 pm Monday-Friday . 8 am to 4 pm Saturday-Sunday   Your e-visit answers were reviewed by a board certified advanced clinical practitioner to complete your personal care plan.  Thank you for using e-Visits. 

## 2018-07-12 ENCOUNTER — Telehealth: Payer: 59 | Admitting: Family

## 2018-07-12 DIAGNOSIS — M546 Pain in thoracic spine: Secondary | ICD-10-CM

## 2018-07-12 MED ORDER — ETODOLAC 300 MG PO CAPS: 300.0000 mg | ORAL_CAPSULE | Freq: Two times a day (BID) | ORAL | 0 refills | Status: DC

## 2018-07-12 MED ORDER — BACLOFEN 10 MG PO TABS: 10.0000 mg | ORAL_TABLET | Freq: Three times a day (TID) | ORAL | 0 refills | Status: DC | PRN

## 2018-07-12 NOTE — Progress Notes (Signed)

## 2018-09-27 ENCOUNTER — Telehealth: Payer: 59 | Admitting: Family

## 2018-09-27 DIAGNOSIS — J111 Influenza due to unidentified influenza virus with other respiratory manifestations: Secondary | ICD-10-CM | POA: Diagnosis not present

## 2018-09-27 MED ORDER — OSELTAMIVIR PHOSPHATE 75 MG PO CAPS: 75.0000 mg | ORAL_CAPSULE | Freq: Two times a day (BID) | ORAL | 0 refills | Status: DC

## 2018-09-27 NOTE — Progress Notes (Signed)
Thank you for the details you included in the comment boxes. Those details are very helpful in determining the best course of treatment for you and help [place] to provide the best care.  As you work at [employer], I know you have a high chance of exposure. Based on your answers, I need to state that if you feel like your throat is closing and are in respiratory distress, please call 911 or go to the ED. Otherwise, follow plan as below.   E visit for Flu like symptoms   We are sorry that you are not feeling well.  Here is how we plan to help! Based on what you have shared with me it looks like you may have a respiratory virus that may be influenza.  Influenza or "the flu" is   an infection caused by a respiratory virus. The flu virus is highly contagious and persons who did not receive their yearly flu vaccination may "catch" the flu from close contact.  We have anti-viral medications to treat the viruses that cause this infection. They are not a "cure" and only shorten the course of the infection. These prescriptions are most effective when they are given within the first 2 days of "flu" symptoms. Antiviral medication are indicated if you have a high risk of complications from the flu. You should  also consider an antiviral medication if you are in close contact with someone who is at risk. These medications can help patients avoid complications from the flu  but have side effects that you should know. Possible side effects from Tamiflu or oseltamivir include nausea, vomiting, diarrhea, dizziness, headaches, eye redness, sleep problems or other respiratory symptoms. You should not take Tamiflu if you have an allergy to oseltamivir or any to the ingredients in Tamiflu.  Based upon your symptoms and potential risk factors I have prescribed Oseltamivir (Tamiflu).  It has been sent to your designated pharmacy.  You will take one 75 mg capsule orally twice a day for the next 5 days.  ANYONE WHO HAS FLU SYMPTOMS  SHOULD: . Stay home. The flu is highly contagious and going out or to work exposes others! . Be sure to drink plenty of fluids. Water is fine as well as fruit juices, sodas and electrolyte beverages. You may want to stay away from caffeine or alcohol. If you are nauseated, try taking small sips of liquids. How do you know if you are getting enough fluid? Your urine should be a pale yellow or almost colorless. . Get rest. . Taking a steamy shower or using a humidifier may help nasal congestion and ease sore throat pain. Using a saline nasal spray works much the same way. . Cough drops, hard candies and sore throat lozenges may ease your cough. . Line up a caregiver. Have someone check on you regularly.   GET HELP RIGHT AWAY IF: . You cannot keep down liquids or your medications. . You become short of breath . Your fell like you are going to pass out or loose consciousness. . Your symptoms persist after you have completed your treatment plan MAKE SURE YOU   Understand these instructions.  Will watch your condition.  Will get help right away if you are not doing well or get worse.  Your e-visit answers were reviewed by a board certified advanced clinical practitioner to complete your personal care plan.  Depending on the condition, your plan could have included both over the counter or prescription medications.  If there is a  problem please reply  once you have received a response from your provider.  Your safety is important to [place].  If you have drug allergies check your prescription carefully.    You can use MyChart to ask questions about today's visit, request a non-urgent call back, or ask for a work or school excuse for 24 hours related to this e-Visit. If it has been greater than 24 hours you will need to follow up with your provider, or enter a new e-Visit to address those concerns.  You will get an e-mail in the next two days asking about your experience.  I hope that your e-visit  has been valuable and will speed your recovery. Thank you for using e-visits.

## 2018-11-16 ENCOUNTER — Telehealth: Payer: 59 | Admitting: Family

## 2018-11-16 DIAGNOSIS — J069 Acute upper respiratory infection, unspecified: Secondary | ICD-10-CM

## 2018-11-16 MED ORDER — FLUTICASONE PROPIONATE 50 MCG/ACT NA SUSP: 2.0000 | Freq: Every day | NASAL | 0 refills | Status: DC

## 2018-11-16 MED ORDER — BENZONATATE 100 MG PO CAPS: 100.0000 mg | ORAL_CAPSULE | Freq: Two times a day (BID) | ORAL | 0 refills | Status: DC | PRN

## 2018-11-16 NOTE — Progress Notes (Signed)
We are sorry you are not feeling well.  Here is how we plan to help!  Based on what you have shared with me, it looks like you may have a viral upper respiratory infection or a "common cold".  Colds are caused by a large number of viruses; however, rhinovirus is the most common cause.   Symptoms of the common cold vary from person to person, with common symptoms including sore throat, cough, and malaise.  A low-grade fever of 100.4 may present, but is often uncommon.  Symptoms vary however, and are closely related to a person's age or underlying illnesses.  The most common symptoms associated with the common cold are nasal discharge or congestion, cough, sneezing, headache and pressure in the ears and face.  Cold symptoms usually persist for about 3 to 10 days, but can last up to 2 weeks.  It is important to know that colds do not cause serious illness or complications in most cases.    The common cold is transmitted from person to person, with the most common method of transmission being a person's hands.  The virus is able to live on the skin and can infect other persons for up to 2 hours after direct contact.  Also, colds are transmitted when someone coughs or sneezes; thus, it is important to cover the mouth to reduce this risk.  To keep the spread of the common cold at [place], good hand hygiene is very important.  This is an infection that is most likely caused by a virus. There are no specific treatments for the common cold other than to help you with the symptoms until the infection runs its course.    For nasal congestion, you may use an oral decongestants such as Mucinex D or if you have glaucoma or high blood pressure use plain Mucinex.  Saline nasal spray or nasal drops can help and can safely be used as often as needed for congestion.  For your congestion, I have prescribed Fluticasone nasal spray one spray in each nostril twice a day  If you do not have a history of heart disease, hypertension,  diabetes or thyroid disease, prostate/bladder issues or glaucoma, you may also use Sudafed to treat nasal congestion.  It is highly recommended that you consult with a pharmacist or your primary care physician to ensure this medication is safe for you to take.     If you have a cough, you may use cough suppressants such as Delsym and Robitussin.  If you have glaucoma or high blood pressure, you can also use Coricidin HBP.   For cough I have prescribed for you A prescription cough medication called Tessalon Perles 100 mg. You may take 1-2 capsules every 8 hours as needed for cough  If you have a sore or scratchy throat, use a saltwater gargle-  to  teaspoon of salt dissolved in a 4-ounce to 8-ounce glass of warm water.  Gargle the solution for approximately 15-30 seconds and then spit.  It is important not to swallow the solution.  You can also use throat lozenges/cough drops and Chloraseptic spray to help with throat pain or discomfort.  Warm or cold liquids can also be helpful in relieving throat pain.  For headache, pain or general discomfort, you can use Ibuprofen or Tylenol as directed.   Some authorities believe that zinc sprays or the use of Echinacea may shorten the course of your symptoms.   HOME CARE . Only take medications as instructed by your  medical team. . Be sure to drink plenty of fluids. Water is fine as well as fruit juices, sodas and electrolyte beverages. You may want to stay away from caffeine or alcohol. If you are nauseated, try taking small sips of liquids. How do you know if you are getting enough fluid? Your urine should be a pale yellow or almost colorless. . Get rest. . Taking a steamy shower or using a humidifier may help nasal congestion and ease sore throat pain. You can place a towel over your head and breathe in the steam from hot water coming from a faucet. . Using a saline nasal spray works much the same way. . Cough drops, hard candies and sore throat lozenges  may ease your cough. . Avoid close contacts especially the very young and the elderly . Cover your mouth if you cough or sneeze . Always remember to wash your hands.   GET HELP RIGHT AWAY IF: . You develop worsening fever. . If your symptoms do not improve within 10 days . You develop yellow or green discharge from your nose over 3 days. . You have coughing fits . You develop a severe head ache or visual changes. . You develop shortness of breath, difficulty breathing or start having chest pain . Your symptoms persist after you have completed your treatment plan  MAKE SURE YOU   Understand these instructions.  Will watch your condition.  Will get help right away if you are not doing well or get worse.  Your e-visit answers were reviewed by a board certified advanced clinical practitioner to complete your personal care plan. Depending upon the condition, your plan could have included both over the counter or prescription medications. Please review your pharmacy choice. If there is a problem, you may call our nursing hot line at and have the prescription routed to another pharmacy. Your safety is important to [place]. If you have drug allergies check your prescription carefully.   You can use MyChart to ask questions about today's visit, request a non-urgent call back, or ask for a work or school excuse for 24 hours related to this e-Visit. If it has been greater than 24 hours you will need to follow up with your provider, or enter a new e-Visit to address those concerns. You will get an e-mail in the next two days asking about your experience.  I hope that your e-visit has been valuable and will speed your recovery. Thank you for using e-visits.

## 2018-12-14 DIAGNOSIS — H5203 Hypermetropia, bilateral: Secondary | ICD-10-CM | POA: Diagnosis not present

## 2018-12-17 ENCOUNTER — Telehealth: Payer: 59 | Admitting: Family

## 2018-12-17 DIAGNOSIS — B9789 Other viral agents as the cause of diseases classified elsewhere: Secondary | ICD-10-CM | POA: Diagnosis not present

## 2018-12-17 DIAGNOSIS — J019 Acute sinusitis, unspecified: Secondary | ICD-10-CM | POA: Diagnosis not present

## 2018-12-17 MED ORDER — PREDNISONE 10 MG (21) PO TBPK
ORAL_TABLET | ORAL | 0 refills | Status: DC
Start: 2018-12-17 — End: 2020-03-26

## 2018-12-17 NOTE — Progress Notes (Signed)
We are sorry that you are not feeling well.  Here is how we plan to help!  Based on what you have shared with me it looks like you have sinusitis.  Sinusitis is inflammation and infection in the sinus cavities of the head.  Based on your presentation I believe you most likely have Acute Viral Sinusitis.This is an infection most likely caused by a virus. There is not specific treatment for viral sinusitis other than to help you with the symptoms until the infection runs its course.  You may use an oral decongestant such as Mucinex D or if you have glaucoma or high blood pressure use plain Mucinex. Saline nasal spray help and can safely be used as often as needed for congestion, I have prescribed: Prednisone dosepak as directed. You may pick this up from the pharmacy.  Some authorities believe that zinc sprays or the use of Echinacea may shorten the course of your symptoms.  Sinus infections are not as easily transmitted as other respiratory infection, however we still recommend that you avoid close contact with loved ones, especially the very young and elderly.  Remember to wash your hands thoroughly throughout the day as this is the number one way to prevent the spread of infection!  Home Care:  Only take medications as instructed by your medical team.  Do not take these medications with alcohol.  A steam or ultrasonic humidifier can help congestion.  You can place a towel over your head and breathe in the steam from hot water coming from a faucet.  Avoid close contacts especially the very young and the elderly.  Cover your mouth when you cough or sneeze.  Always remember to wash your hands.  Get Help Right Away If:  You develop worsening fever or sinus pain.  You develop a severe head ache or visual changes.  Your symptoms persist after you have completed your treatment plan.  Make sure you  Understand these instructions.  Will watch your condition.  Will get help right away if  you are not doing well or get worse.  Your e-visit answers were reviewed by a board certified advanced clinical practitioner to complete your personal care plan.  Depending on the condition, your plan could have included both over the counter or prescription medications.  If there is a problem please reply  once you have received a response from your provider.  Your safety is important to [place].  If you have drug allergies check your prescription carefully.    You can use MyChart to ask questions about today's visit, request a non-urgent call back, or ask for a work or school excuse for 24 hours related to this e-Visit. If it has been greater than 24 hours you will need to follow up with your provider, or enter a new e-Visit to address those concerns.  You will get an e-mail in the next two days asking about your experience.  I hope that your e-visit has been valuable and will speed your recovery. Thank you for using e-visits.

## 2019-01-29 ENCOUNTER — Telehealth: Payer: 59 | Admitting: Physician Assistant

## 2019-01-29 DIAGNOSIS — R21 Rash and other nonspecific skin eruption: Secondary | ICD-10-CM | POA: Diagnosis not present

## 2019-01-29 NOTE — Progress Notes (Signed)
Based on what you shared with me, I feel your condition warrants further evaluation and I recommend that you be seen for a face to face office visit.     NOTE: If you entered your credit card information for this eVisit, you will not be charged. You may see a "hold" on your card for the $35 but that hold will drop off and you will not have a charge processed.  If you are having a true medical emergency please call 911.  If you need an urgent face to face visit, [place] has four urgent care centers for your convenience.    PLEASE NOTE: THE [facility] LOCATIONS AND URGENT CARE CLINICS DO NOT HAVE THE TESTING FOR CORONAVIRUS COVID19 AVAILABLE.  IF YOU FEEL YOU NEED THIS TEST YOU MUST GO TO A TRIAGE LOCATION AT [place] ?  [email or web address] to reserve your spot online an avoid wait times  [facility] [address], [address] [place], [place] [address] Modified hours of operation: Monday-Friday, 12 PM to 6 PM  Saturday & Sunday 10 AM to 4 PM *Across the street from [place] (New Address!) [address], [place], [place] [address] *Just off [address] across the road from [place] hours of operation: Monday-Friday, 12 PM to 6 PM  Closed Saturday & Sunday  [facility]'s modified hours of operation will be in effect from May 1 until May 31   The following sites will take your insurance:  [facility] [place] a Provider at this Location  [place], [place] [address] 10 am to 8 pm Monday-Friday 12 pm to 8 pm [facility] [place] a Provider at this Location  [place] [address], [place], [place] [address] 8 am to 8 pm Monday-Friday 9 am to 6 pm Saturday 11 am to 6 pm Sunday   [facility]  at [facility]  [phone] Get Driving Directions  [address] [place], [place] [address] 8 am to 8 pm Monday-Friday 8 am to 4 pm Saturday-Sunday   Your e-visit answers were reviewed by a board certified advanced clinical practitioner to complete your personal care plan.  Thank you for using e-Visits.  ===View-only below this line===   ----- Message -----    From: Linda Wilson    Sent: 01/29/2019 12:57 PM EDT      To: E-Visit Mailing List Subject: E-Visit Submission: Cellulitis  E-Visit Submission: Cellulitis --------------------------------  Question: Please describe how your skin condition appears.  You may choose more than one. Answer:   Swelling (raised region)  Question: What areas are affected? Answer:   Lower leg  Question: Did you previously have; Answer:   None of the above  Question: Do you have a history of; (choose all that apply) Answer:   None of the above  Question: Do you have a fever? Answer:   No, I do not have a fever  Question: Do you have chills? Answer:   No  Question: Do you have swollen glands? Answer:   No  Question: Does the affected area itch? Answer:   No  Question: Is the area painful? Answer:   No  Question: How long have you been having these symptoms? Answer:   2 days  Question: Have you had a tick bite in the last 2  weeks? Answer:   No  Question: Have you tried any over-the-counter medications? Answer:   No, I have not tried any medications  Question: Please list your medication allergies that you may have ? (If 'none' , please list as 'none') Answer:   none  Question: Please list any additional comments  Answer:     Question: Are you pregnant? Answer:   I am confident that I am not pregnant  Question: Are you breastfeeding? Answer:   No

## 2019-02-02 NOTE — Progress Notes (Signed)
Greater than 5 minutes, yet less than 10 minutes of time have been spent researching, coordinating, and implementing care for this patient today.  Thank you for the details you included in the comment boxes. Those details are very helpful in determining the best course of treatment for you and help us to provide the best care.  

## 2019-04-05 ENCOUNTER — Telehealth: Payer: 59 | Admitting: Nurse Practitioner

## 2019-04-05 DIAGNOSIS — M545 Low back pain, unspecified: Secondary | ICD-10-CM

## 2019-04-05 MED ORDER — NAPROXEN 500 MG PO TABS: 500.0000 mg | ORAL_TABLET | Freq: Two times a day (BID) | ORAL | 0 refills | Status: DC

## 2019-04-05 MED ORDER — CYCLOBENZAPRINE HCL 10 MG PO TABS: 10.0000 mg | ORAL_TABLET | Freq: Three times a day (TID) | ORAL | 0 refills | Status: DC | PRN

## 2019-04-05 NOTE — Progress Notes (Signed)

## 2019-04-23 ENCOUNTER — Telehealth: Payer: 59 | Admitting: Family

## 2019-04-23 DIAGNOSIS — L03115 Cellulitis of right lower limb: Secondary | ICD-10-CM

## 2019-04-23 NOTE — Progress Notes (Signed)
Based on what you shared with me, I feel your condition warrants further evaluation and I recommend that you be seen for a face to face office visit.  NOTE: If you entered your credit card information for this eVisit, you will not be charged. You may see a "hold" on your card for the $35 but that hold will drop off and you will not have a charge processed.  If you are having a true medical emergency please call 911.     For an urgent face to face visit, [place] has five urgent care centers for your convenience:   This sounds like an abscess. It may need to be opened.   [email or web address] to reserve your spot online an avoid wait times  [facility] (New Address!) [address], [place], [place] [address] *Just off [address], across the road from [place] hours of operation: Monday-Friday, 12 PM to 6 PM  Closed Saturday & Sunday   The following sites will take your insurance:  . [facility]    [phone]                  Get Driving Directions  [address], [place] [address] . 10 am to 8 pm Monday-Friday . 12 pm to 8 pm Saturday-Sunday   . [facility] Urgent Care at [place]                  Get Driving Directions  [address], [place], [place] [address] . 8 am to 8 pm Monday-Friday . 9 am to 6 pm Saturday . 11 am to 6 pm Sunday   . [facility] Urgent Care at [place]                  Get Driving Directions   [address] [place], [place] [address] . 8 am to 8 pm Monday-Friday . 8 am to 4 pm Saturday-Sunday    . [facility] Urgent Care at [place]                    Get Driving Directions  [phone]  [address] [place], [place] [address]  . Monday-Friday, 12 PM to 6 PM    Your e-visit answers were reviewed by a board certified advanced clinical practitioner to complete  your personal care plan.  Thank you for using e-Visits.

## 2019-08-01 DIAGNOSIS — H6121 Impacted cerumen, right ear: Secondary | ICD-10-CM | POA: Diagnosis not present

## 2019-08-29 ENCOUNTER — Telehealth: Payer: Self-pay | Admitting: *Deleted

## 2019-08-29 NOTE — Telephone Encounter (Signed)
Linda Wilson with [facility] calling to request demographic information for patient due to positive COVID results.Pt's address and phone number provided.

## 2019-10-14 ENCOUNTER — Telehealth: Payer: 59 | Admitting: Physician Assistant

## 2019-10-14 DIAGNOSIS — R05 Cough: Secondary | ICD-10-CM | POA: Diagnosis not present

## 2019-10-14 DIAGNOSIS — R0602 Shortness of breath: Secondary | ICD-10-CM

## 2019-10-14 DIAGNOSIS — U071 COVID-19: Secondary | ICD-10-CM | POA: Diagnosis not present

## 2019-10-14 DIAGNOSIS — J069 Acute upper respiratory infection, unspecified: Secondary | ICD-10-CM | POA: Diagnosis not present

## 2019-10-14 DIAGNOSIS — R059 Cough, unspecified: Secondary | ICD-10-CM

## 2019-10-14 MED ORDER — PREDNISONE 10 MG PO TABS: ORAL_TABLET | ORAL | 0 refills | Status: DC

## 2019-10-14 MED ORDER — PSEUDOEPHEDRINE-CODEINE-GG 30-10-100 MG/5ML PO SOLN: 10.0000 mL | Freq: Every evening | ORAL | 0 refills | Status: DC | PRN

## 2019-10-14 MED ORDER — ALBUTEROL SULFATE HFA 108 (90 BASE) MCG/ACT IN AERS
2.0000 | INHALATION_SPRAY | RESPIRATORY_TRACT | 0 refills | Status: DC | PRN
Start: 2019-10-14 — End: 2020-03-26

## 2019-10-14 MED ORDER — BENZONATATE 100 MG PO CAPS: 100.0000 mg | ORAL_CAPSULE | Freq: Three times a day (TID) | ORAL | 0 refills | Status: DC | PRN

## 2019-10-14 MED FILL — ALBUTEROL SULFATE HFA 108 (: 108 (90 BAS | 16 days supply | Qty: 18 | Fill #0

## 2019-10-14 MED FILL — predniSONE 10 MG (21) TBPK: 10 | 6 days supply | Qty: 21 | Fill #0

## 2019-10-14 MED FILL — BENZONATATE 100 MG CAPS: 100 | 6 days supply | Qty: 40 | Fill #0

## 2019-10-14 NOTE — Progress Notes (Signed)
We are sorry that you are not feeling well.  Here is how we plan to help!  Based on your presentation I believe you most likely have A cough due to a virus.  This is called viral bronchitis and is best treated by rest, plenty of fluids and control of the cough.  You may use Ibuprofen or Tylenol as directed to help your symptoms.     In addition you may use A non-prescription cough medication called Robitussin DAC. Take 2 teaspoons every 8 hours or Delsym: take 2 teaspoons every 12 hours. and A prescription cough medication called Tessalon Perles 100mg . You may take 1-2 capsules every 8 hours as needed for your cough.  Inhaler called Albuterol for shortness of breath/wheezing. Take 2 puffs every 4-6 hours as needed.   Prednisone 10 mg daily for 6 days (see taper instructions below)  Directions for 6 day taper: Day 1: 2 tablets before breakfast, 1 after both lunch & dinner and 2 at bedtime Day 2: 1 tab before breakfast, 1 after both lunch & dinner and 2 at bedtime Day 3: 1 tab at each meal & 1 at bedtime Day 4: 1 tab at breakfast, 1 at lunch, 1 at bedtime Day 5: 1 tab at breakfast & 1 tab at bedtime Day 6: 1 tab at breakfast   From your responses in the eVisit questionnaire you describe inflammation in the upper respiratory tract which is causing a significant cough.  This is commonly called Bronchitis and has four common causes:    Allergies  Viral Infections  Acid Reflux  Bacterial Infection Allergies, viruses and acid reflux are treated by controlling symptoms or eliminating the cause. An example might be a cough caused by taking certain blood pressure medications. You stop the cough by changing the medication. Another example might be a cough caused by acid reflux. Controlling the reflux helps control the cough.  USE OF BRONCHODILATOR ("RESCUE") INHALERS: There is a risk from using your bronchodilator too frequently.  The risk is that over-reliance on a medication which only relaxes  the muscles surrounding the breathing tubes can reduce the effectiveness of medications prescribed to reduce swelling and congestion of the tubes themselves.  Although you feel brief relief from the bronchodilator inhaler, your asthma may actually be worsening with the tubes becoming more swollen and filled with mucus.  This can delay other crucial treatments, such as oral steroid medications. If you need to use a bronchodilator inhaler daily, several times per day, you should discuss this with your provider.  There are probably better treatments that could be used to keep your asthma under control.     HOME CARE . Only take medications as instructed by your medical team. . Complete the entire course of an antibiotic. . Drink plenty of fluids and get plenty of rest. . Avoid close contacts especially the very young and the elderly . Cover your mouth if you cough or cough into your sleeve. . Always remember to wash your hands . A steam or ultrasonic humidifier can help congestion.   GET HELP RIGHT AWAY IF: . You develop worsening fever. . You become short of breath . You cough up blood. . Your symptoms persist after you have completed your treatment plan MAKE SURE YOU   Understand these instructions.  Will watch your condition.  Will get help right away if you are not doing well or get worse.  Your e-visit answers were reviewed by a board certified advanced clinical practitioner to complete  your personal care plan.  Depending on the condition, your plan could have included both over the counter or prescription medications. If there is a problem please reply  once you have received a response from your provider. Your safety is important to [place].  If you have drug allergies check your prescription carefully.    You can use MyChart to ask questions about today's visit, request a non-urgent call back, or ask for a work or school excuse for 24 hours related to this e-Visit. If it has been greater  than 24 hours you will need to follow up with your provider, or enter a new e-Visit to address those concerns. You will get an e-mail in the next two days asking about your experience.  I hope that your e-visit has been valuable and will speed your recovery. Thank you for using e-visits.  Greater than 5 minutes, yet less than 10 minutes of time have been spent researching, coordinating and implementing care for this patient today.

## 2019-10-21 ENCOUNTER — Telehealth: Payer: 59 | Admitting: Nurse Practitioner

## 2019-10-21 DIAGNOSIS — R059 Cough, unspecified: Secondary | ICD-10-CM

## 2019-10-21 DIAGNOSIS — R05 Cough: Secondary | ICD-10-CM

## 2019-10-21 NOTE — Progress Notes (Signed)
Based on what you shared with me it looks like you have cough,that should be evaluated in a face to face office visit. According to your chart you were given a z pak in  [place]. Then you did an evisit on 10/14/19 and were given tessalon perles , tamiflu and prednisone. There is nothing else I can give you in an evisit. If you are not getting better then you need see your PCP at this point. Sorry I could not do any more for you.    NOTE: If you entered your credit card information for this eVisit, you will not be charged. You may see a "hold" on your card for the $35 but that hold will drop off and you will not have a charge processed.  If you are having a true medical emergency please call 911.     For an urgent face to face visit, [place] has four urgent care centers for your convenience:   . [facility]    [phone]                  Get Driving Directions  [id number] [place], [place] [address] . 10 am to 8 pm Monday-Friday . 12 pm to 8 pm Saturday-Sunday   . [facility] Health Urgent Care at [place]                  Get Driving Directions  [phone] [place], [place], [place] [address] . 8 am to 8 pm Monday-Friday . 9 am to 6 pm Saturday . 11 am to 6 pm Sunday   . [facility] Health Urgent Care at [place]                  Get Driving Directions   [address] [place], [place] [address] . 8 am to 8 pm Monday-Friday . 8 am to 4 pm Saturday-Sunday    . [facility] Urgent Care at                     Get Driving Directions  [id number]  [address] [place],  [address]  . Monday-Friday, 12 PM to 6 PM    Your e-visit answers were reviewed by a board certified advanced clinical practitioner to complete your personal care plan.  Thank you for using e-Visits.

## 2020-03-19 ENCOUNTER — Telehealth: Payer: 59 | Admitting: Physician Assistant

## 2020-03-19 DIAGNOSIS — Z23 Encounter for immunization: Secondary | ICD-10-CM | POA: Diagnosis not present

## 2020-03-19 DIAGNOSIS — H6011 Cellulitis of right external ear: Secondary | ICD-10-CM | POA: Diagnosis not present

## 2020-03-19 DIAGNOSIS — Z5189 Encounter for other specified aftercare: Secondary | ICD-10-CM

## 2020-03-19 DIAGNOSIS — R030 Elevated blood-pressure reading, without diagnosis of hypertension: Secondary | ICD-10-CM | POA: Diagnosis not present

## 2020-03-19 NOTE — Progress Notes (Signed)
E visit for Simple Cut/Laceration  We are sorry that you have had an injury. Here is how we plan to help!  Please go immediately to your family doctor, or an urgent care to obtain an in-person exam to evaluated your wound. I am concerned about the swelling on your ear and about your low grade fever and I feel that this complaint would be better evaluated by a provider who can see and assess the wound for any complicating features.   HOME CARE: . Clean the cut or scrape - Wash it well with soap and water. * avoid using hydrogen peroxide which may cause tissue damage, or impede wound healing.  . Stop the bleeding - If your cut or scrape is bleeding, press a clean cloth or bandage firmly on the area for 20 minutes. You can also help slow the bleeding by holding the cut above the level of your heart.   . Put a thin layer of Bacitracin antibiotic ointment on the cut or scrape. (this can be purchased at any local pharmacy- ask your pharmacist if you need assistance)   . Cover the cut or scrape with a bandage or gauze. Keep the bandage clean and dry. Change the bandage 1 to 2 times every day until your cut or scrape heals.   . Watch for signs that your cut or scrape is infected (redness, drainage, pain, warmth, swelling or fever)  Over the next 48 hours your wound should start to improve with less pain, less swelling and less redness. If you should develop increasing pain, swelling, redness, fever, pus from the wound you should be seen immediately to make sure this is not becoming infected.   WOUND CARE: Please keep a layer of antibiotic ointment (bacitracin preferred) on this wound at least twice a day for the next seven days and keep a sterile dressing over top of it. You may gently clean the wound with warm soap and water between dressing changes.  We strongly recommend that you have a medical provider reevaluate your wound within 2 to 3 days in person to make sure that it is healing  appropriately.  Thank you for choosing an e-visit. Your e-visit answers were reviewed by a board certified advanced clinical practitioner to complete your personal care plan. Depending upon the condition, your plan could have included both over the counter or prescription medications. Please review your pharmacy choice. Make sure the pharmacy is open so you can pick up prescription now. If there is a problem, you may contact your provider through [employer] and have the prescription routed to another pharmacy. Your safety is important to [place]. If you have drug allergies check your prescription carefully.  For the next 24 hours you can use MyChart to ask questions about today's visit, request a non-urgent call back, or ask for a work or school excuse.  You will get an email with a link to our survey asking about your experience. I hope that your e-visit has been valuable and will speed your recovery  Approximately 5 minutes was spent documenting and reviewing patient's chart.

## 2020-03-26 ENCOUNTER — Encounter: Payer: Self-pay | Admitting: Family

## 2020-03-26 ENCOUNTER — Ambulatory Visit (INDEPENDENT_AMBULATORY_CARE_PROVIDER_SITE_OTHER): Payer: 59 | Admitting: Family

## 2020-03-26 ENCOUNTER — Other Ambulatory Visit: Payer: Self-pay

## 2020-03-26 VITALS — BP 176/91 | HR 98 | Temp 93.0°F | Ht 69.0 in | Wt 294.6 lb

## 2020-03-26 DIAGNOSIS — H6011 Cellulitis of right external ear: Secondary | ICD-10-CM | POA: Diagnosis not present

## 2020-03-26 DIAGNOSIS — L853 Xerosis cutis: Secondary | ICD-10-CM | POA: Diagnosis not present

## 2020-03-26 DIAGNOSIS — L089 Local infection of the skin and subcutaneous tissue, unspecified: Secondary | ICD-10-CM | POA: Diagnosis not present

## 2020-03-26 MED ORDER — MUPIROCIN 2 % EX OINT: 1.0000 "application " | TOPICAL_OINTMENT | Freq: Two times a day (BID) | CUTANEOUS | 0 refills | Status: DC

## 2020-03-26 NOTE — Patient Instructions (Signed)

## 2020-03-26 NOTE — Progress Notes (Signed)
   Subjective:    Patient ID: Linda Wilson, female    DOB: [birth year], 49 y.o.   MRN: [id number]  Chief Complaint  Patient presents with  . Laceration    right ear     HPI  Pt presents to the office today with cut in her right ear that she noticed 03/18/20 and went to the Urgent Care on 03/19/20. She was started on Keflex 500 mg QID  and Motrin. She reports since starting the antibiotic her swelling and tenderness has improved. However, she still has a slight tenderness.   Denies any fever, recent discharge.   Review of Systems  Skin: Positive for rash.  All other systems reviewed and are negative.      Objective:   Physical Exam Vitals reviewed.  Constitutional:      General: She is not in acute distress.    Appearance: She is well-developed.  HENT:     Head: Normocephalic and atraumatic.     Right Ear: Tympanic membrane normal.     Left Ear: Tympanic membrane normal.  Eyes:     Pupils: Pupils are equal, round, and reactive to light.  Neck:     Thyroid: No thyromegaly.  Cardiovascular:     Rate and Rhythm: Normal rate and regular rhythm.     Heart sounds: Normal heart sounds. No murmur heard.   Pulmonary:     Effort: Pulmonary effort is normal. No respiratory distress.     Breath sounds: Normal breath sounds. No wheezing.  Abdominal:     General: Bowel sounds are normal. There is no distension.     Palpations: Abdomen is soft.     Tenderness: There is no abdominal tenderness.  Musculoskeletal:        General: No tenderness. Normal range of motion.     Cervical back: Normal range of motion and neck supple.  Skin:    General: Skin is warm and dry.     Findings: Erythema present.     Comments: Area dry  Neurological:     Mental Status: She is alert and oriented to person, place, and time.     Cranial Nerves: No cranial nerve deficit.     Deep Tendon Reflexes: Reflexes are normal and symmetric.  Psychiatric:        Behavior: Behavior normal.        Thought  Content: Thought content normal.        Judgment: Judgment normal.      BP (!) 176/91   Pulse 98   Temp (!) 93 F (33.9 C) (Temporal)   Ht 5\' 9"  (1.753 m)   Wt 294 lb 9.6 oz (133.6 kg)   SpO2 100%   BMI 43.50 kg/m       Assessment & Plan:  Linda Wilson comes in today with chief complaint of Laceration (right ear )   Diagnosis and orders addressed:  1. Skin infection Area greatly improved Continue Keflex  Report any increase s/s of infection - mupirocin ointment (BACTROBAN) 2 %; Apply 1 application topically 2 (two) times daily.  Dispense: 22 g; Refill: 0 - CBC with Differential/Platelet  2. Dry skin - CBC with Differential/Platelet - TSH  3. Cellulitis of right ear  Evelina Dun, FNP

## 2020-03-27 LAB — CBC WITH DIFFERENTIAL/PLATELET
Basophils Absolute: 0 10*3/uL (ref 0.0–0.2)
Basos: 0 %
EOS (ABSOLUTE): 0.4 10*3/uL (ref 0.0–0.4)
Eos: 4 %
Hematocrit: 32.7 % — ABNORMAL LOW (ref 34.0–46.6)
Hemoglobin: 9.3 g/dL — ABNORMAL LOW (ref 11.1–15.9)
Immature Grans (Abs): 0.1 10*3/uL (ref 0.0–0.1)
Immature Granulocytes: 1 %
Lymphocytes Absolute: 2.9 10*3/uL (ref 0.7–3.1)
Lymphs: 31 %
MCH: 18.3 pg — ABNORMAL LOW (ref 26.6–33.0)
MCHC: 28.4 g/dL — ABNORMAL LOW (ref 31.5–35.7)
MCV: 65 fL — ABNORMAL LOW (ref 79–97)
Monocytes Absolute: 0.9 10*3/uL (ref 0.1–0.9)
Monocytes: 10 %
Neutrophils Absolute: 4.9 10*3/uL (ref 1.4–7.0)
Neutrophils: 54 %
Platelets: 336 10*3/uL (ref 150–450)
RBC: 5.07 x10E6/uL (ref 3.77–5.28)
RDW: 19 % — ABNORMAL HIGH (ref 11.7–15.4)
WBC: 9.2 10*3/uL (ref 3.4–10.8)

## 2020-03-27 LAB — TSH: TSH: 2.45 u[IU]/mL (ref 0.450–4.500)

## 2020-03-31 ENCOUNTER — Other Ambulatory Visit: Payer: Self-pay | Admitting: Family

## 2020-03-31 DIAGNOSIS — D619 Aplastic anemia, unspecified: Secondary | ICD-10-CM

## 2020-08-15 DIAGNOSIS — H5203 Hypermetropia, bilateral: Secondary | ICD-10-CM | POA: Diagnosis not present

## 2020-08-28 ENCOUNTER — Telehealth: Payer: 59 | Admitting: Family

## 2020-08-28 DIAGNOSIS — L239 Allergic contact dermatitis, unspecified cause: Secondary | ICD-10-CM

## 2020-08-28 MED ORDER — PREDNISONE 10 MG (21) PO TBPK: ORAL_TABLET | ORAL | 0 refills | Status: DC

## 2020-08-28 NOTE — Progress Notes (Signed)
E Visit for Rash  We are sorry that you are not feeling well. Here is how we plan to help!   Based on what you shared with me you may have a virus or an allergic reaction.  Avoid contact with pregnant women until a diagnosis is made.  Most viral rashes are contagious (especially if a fever is present).  You can return to work or school after the rash is gone or when your doctor says it is safe to return with the rash.    Prednisone 10 mg daily for 6 days (see taper instructions below). I have sent this to the pharmacy for you  Directions for 6 day taper: Day 1: 2 tablets before breakfast, 1 after both lunch & dinner and 2 at bedtime Day 2: 1 tab before breakfast, 1 after both lunch & dinner and 2 at bedtime Day 3: 1 tab at each meal & 1 at bedtime Day 4: 1 tab at breakfast, 1 at lunch, 1 at bedtime Day 5: 1 tab at breakfast & 1 tab at bedtime Day 6: 1 tab at breakfast     HOME CARE:   Take cool showers and avoid direct sunlight.  Apply cool compress or wet dressings.  Take a bath in an oatmeal bath.  Sprinkle content of one Aveeno packet under running faucet with comfortably warm water.  Bathe for 15-20 minutes, 1-2 times daily.  Pat dry with a towel. Do not rub the rash.  Use hydrocortisone cream.  Take an antihistamine like Benadryl for widespread rashes that itch.  The adult dose of Benadryl is 25-50 mg by mouth 4 times daily.  Caution:  This type of medication may cause sleepiness.  Do not drink alcohol, drive, or operate dangerous machinery while taking antihistamines.  Do not take these medications if you have prostate enlargement.  Read package instructions thoroughly on all medications that you take.  GET HELP RIGHT AWAY IF:   Symptoms don't go away after treatment.  Severe itching that persists.  If you rash spreads or swells.  If you rash begins to smell.  If it blisters and opens or develops a yellow-brown crust.  You develop a fever.  You have a sore  throat.  You become short of breath.  MAKE SURE YOU:  Understand these instructions. Will watch your condition. Will get help right away if you are not doing well or get worse.  Thank you for choosing an e-visit. Your e-visit answers were reviewed by a board certified advanced clinical practitioner to complete your personal care plan. Depending upon the condition, your plan could have included both over the counter or prescription medications. Please review your pharmacy choice. Be sure that the pharmacy you have chosen is open so that you can pick up your prescription now.  If there is a problem you may message your provider in [place] to have the prescription routed to another pharmacy. Your safety is important to [place]. If you have drug allergies check your prescription carefully.  For the next 24 hours, you can use MyChart to ask questions about today's visit, request a non-urgent call back, or ask for a work or school excuse from your e-visit provider. You will get an email in the next two days asking about your experience. I hope that your e-visit has been valuable and will speed your recovery.   Greater than 5 minutes, yet less than 10 minutes of time have been spent researching, coordinating, and implementing care for this patient today.  Thank you for the details you included in the comment boxes. Those details are very helpful in determining the best course of treatment for you and help [place] to provide the best care.

## 2020-09-09 ENCOUNTER — Telehealth: Payer: 59 | Admitting: Nurse Practitioner

## 2020-09-09 ENCOUNTER — Other Ambulatory Visit: Payer: Self-pay | Admitting: Nurse Practitioner

## 2020-09-09 DIAGNOSIS — L509 Urticaria, unspecified: Secondary | ICD-10-CM | POA: Diagnosis not present

## 2020-09-09 MED ORDER — PREDNISONE 10 MG PO TABS
ORAL_TABLET | ORAL | 0 refills | Status: DC
Start: 2020-09-09 — End: 2020-10-05

## 2020-09-09 MED FILL — predniSONE 10 MG TABS: 10 | 14 days supply | Qty: 37 | Fill #0

## 2020-09-09 NOTE — Progress Notes (Signed)
E Visit for Rash  We are sorry that you are not feeling well. Here is how we plan to help!    Based on what you shared with me you may have a virus or an allergic reaction.  Avoid contact with pregnant women until a diagnosis is made.  Most viral rashes are contagious (especially if a fever is present).  You can return to work or school after the rash is gone or when your doctor says it is safe to return with the rash.  I am prescribing a two week course of steroids (37 tablets of 10 mg prednisone).  Days 1-4 take 4 tablets (40 mg) daily  Days 5-8 take 3 tablets (30 mg) daily, Days 9-11 take 2 tablets (20 mg) daily, Days 12-14 take 1 tablet (10 mg) daily.   Since this is your second evisit for this. If this does not work you will need a face to face visit in the future.  HOME CARE:   Take cool showers and avoid direct sunlight.  Apply cool compress or wet dressings.  Take a bath in an oatmeal bath.  Sprinkle content of one Aveeno packet under running faucet with comfortably warm water.  Bathe for 15-20 minutes, 1-2 times daily.  Pat dry with a towel. Do not rub the rash.  Use hydrocortisone cream.  Take an antihistamine like Benadryl for widespread rashes that itch.  The adult dose of Benadryl is 25-50 mg by mouth 4 times daily.  Caution:  This type of medication may cause sleepiness.  Do not drink alcohol, drive, or operate dangerous machinery while taking antihistamines.  Do not take these medications if you have prostate enlargement.  Read package instructions thoroughly on all medications that you take.  GET HELP RIGHT AWAY IF:   Symptoms don't go away after treatment.  Severe itching that persists.  If you rash spreads or swells.  If you rash begins to smell.  If it blisters and opens or develops a yellow-brown crust.  You develop a fever.  You have a sore throat.  You become short of breath.  MAKE SURE YOU:  Understand these instructions. Will watch your  condition. Will get help right away if you are not doing well or get worse.  Thank you for choosing an e-visit. Your e-visit answers were reviewed by a board certified advanced clinical practitioner to complete your personal care plan. Depending upon the condition, your plan could have included both over the counter or prescription medications. Please review your pharmacy choice. Be sure that the pharmacy you have chosen is open so that you can pick up your prescription now.  If there is a problem you may message your provider in [place] to have the prescription routed to another pharmacy. Your safety is important to [place]. If you have drug allergies check your prescription carefully.  For the next 24 hours, you can use MyChart to ask questions about today's visit, request a non-urgent call back, or ask for a work or school excuse from your e-visit provider. You will get an email in the next two days asking about your experience. I hope that your e-visit has been valuable and will speed your recovery.   5-10 minutes spent reviewing and documenting in chart.

## 2020-10-05 ENCOUNTER — Telehealth: Payer: 59 | Admitting: Family

## 2020-10-05 DIAGNOSIS — R059 Cough, unspecified: Secondary | ICD-10-CM | POA: Diagnosis not present

## 2020-10-05 MED ORDER — PREDNISONE 10 MG (21) PO TBPK
ORAL_TABLET | ORAL | 0 refills | Status: DC
Start: 2020-10-05 — End: 2020-10-22

## 2020-10-05 MED ORDER — BENZONATATE 100 MG PO CAPS
100.0000 mg | ORAL_CAPSULE | Freq: Three times a day (TID) | ORAL | 0 refills | Status: DC | PRN
Start: 2020-10-05 — End: 2021-04-09

## 2020-10-05 NOTE — Progress Notes (Signed)

## 2020-10-22 ENCOUNTER — Ambulatory Visit (HOSPITAL_COMMUNITY)
Admission: EM | Admit: 2020-10-22 | Discharge: 2020-10-22 | Disposition: A | Discharge status: Home / Self Care | Payer: 59 | Attending: Family Medicine | Admitting: Family Medicine

## 2020-10-22 ENCOUNTER — Other Ambulatory Visit: Payer: Self-pay

## 2020-10-22 ENCOUNTER — Encounter (HOSPITAL_COMMUNITY): Payer: Self-pay

## 2020-10-22 DIAGNOSIS — M25562 Pain in left knee: Secondary | ICD-10-CM | POA: Diagnosis not present

## 2020-10-22 MED ORDER — CYCLOBENZAPRINE HCL 10 MG PO TABS
10.0000 mg | ORAL_TABLET | Freq: Three times a day (TID) | ORAL | 0 refills | Status: DC | PRN
Start: 2020-10-22 — End: 2021-12-22

## 2020-10-22 MED ORDER — PREDNISONE 10 MG (21) PO TBPK
ORAL_TABLET | ORAL | 0 refills | Status: DC
Start: 2020-10-22 — End: 2021-04-09

## 2020-10-22 NOTE — ED Triage Notes (Signed)
Pt presents with knee pain for the past 2 weeks. Pt states she was getting out of her sons lifted truck and did something to her knee has used ice and otc pain medications with some relief but is still in pain.

## 2020-10-22 NOTE — ED Provider Notes (Signed)
[place]    CSN: [id number] Arrival date & time: 10/22/20  1621      History   Chief Complaint Chief Complaint  Patient presents with  . Knee Injury    HPI Linda Wilson is a 50 y.o. female.   Here today with about 2 weeks of constant left knee pain that is worse with movement and weight bearing. States she was stepping out of her son's lifted truck and landed on some ice and her leg slid from under her. She did not fall or hit anything on it. The pain is anterior and extending upward b/l, and sore into lower thigh. Denies discoloration, swelling, numbness, tingling, hx of knee injuries. Taking OTC pain relievers and using heat and ice without relief.      History reviewed. No pertinent past medical history.  There are no problems to display for this patient.   Past Surgical History:  Procedure Laterality Date  . CESAREAN SECTION    . TUBAL LIGATION      OB History   No obstetric history on file.      Home Medications    Prior to Admission medications   Medication Sig Start Date End Date Taking? Authorizing Provider  cyclobenzaprine (FLEXERIL) 10 MG tablet Take 1 tablet (10 mg total) by mouth 3 (three) times daily as needed for muscle spasms. DO NOT DRINK ALCOHOL OR DRIVE WHILE TAKING THIS MEDICATION 10/22/20  Yes Volney American, PA-C  benzonatate (TESSALON PERLES) 100 MG capsule Take 1 capsule (100 mg total) by mouth 3 (three) times daily as needed. 10/05/20   Evelina Dun A, FNP  cephALEXin (KEFLEX) 500 MG capsule Take 500 mg by mouth 4 (four) times daily. 03/19/20   [provider]  mupirocin ointment (BACTROBAN) 2 % Apply 1 application topically 2 (two) times daily. 03/26/20   Sharion Balloon, FNP  predniSONE (STERAPRED UNI-PAK 21 TAB) 10 MG (21) TBPK tablet Use as directed 10/22/20   Volney American, PA-C    Family History Family History  Problem Relation Age of Onset  . Healthy Mother   . Heart disease Father         MI  . Diabetes Father   . Stroke Father     Social History Social History   Tobacco Use  . Smoking status: Never Smoker  . Smokeless tobacco: Never Used  Substance Use Topics  . Alcohol use: No  . Drug use: No     Allergies   Patient has no known allergies.   Review of Systems Review of Systems PER HPI    Physical Exam Triage Vital Signs ED Triage Vitals  Enc Vitals Group     BP 10/22/20 1702 (!) 164/84     Pulse Rate 10/22/20 1702 92     Resp 10/22/20 1702 16     Temp 10/22/20 1702 97.8 F (36.6 C)     Temp Source 10/22/20 1702 Oral     SpO2 10/22/20 1702 97 %     Weight 10/22/20 1659 289 lb (131.1 kg)     Height 10/22/20 1659 5\' 9"  (1.753 m)     Head Circumference --      Peak Flow --      Pain Score 10/22/20 1659 6     Pain Loc --      Pain Edu? --      Excl. in [place]? --    No data found.  Updated Vital Signs BP (!) 164/84 (BP Location:  Right Arm)   Pulse 92   Temp 97.8 F (36.6 C) (Oral)   Resp 16   Ht 5\' 9"  (1.753 m)   Wt 289 lb (131.1 kg)   LMP 11/09/2014   SpO2 97%   BMI 42.68 kg/m   Visual Acuity Right Eye Distance:   Left Eye Distance:   Bilateral Distance:    Right Eye Near:   Left Eye Near:    Bilateral Near:     Physical Exam Vitals and nursing note reviewed.  Constitutional:      Appearance: Normal appearance. She is not ill-appearing.  HENT:     Head: Atraumatic.  Eyes:     Extraocular Movements: Extraocular movements intact.     Conjunctiva/sclera: Conjunctivae normal.  Cardiovascular:     Rate and Rhythm: Normal rate and regular rhythm.     Heart sounds: Normal heart sounds.  Pulmonary:     Effort: Pulmonary effort is normal.     Breath sounds: Normal breath sounds.  Musculoskeletal:        General: Tenderness (anterior left knee ttp extending to b/l joint lines and up into insertion into distal quadricep) and signs of injury present. No swelling or deformity. Normal range of motion.     Cervical back: Normal range  of motion and neck supple.     Comments: No joint instability on drawer test and neg mcmurrays Ambulating with a limp ROM intact but painful   Skin:    General: Skin is warm and dry.     Findings: No bruising or erythema.  Neurological:     Mental Status: She is alert and oriented to person, place, and time.     Comments: Left LE neurovascularly intact  Psychiatric:        Mood and Affect: Mood normal.        Thought Content: Thought content normal.        Judgment: Judgment normal.      UC Treatments / Results  Labs (all labs ordered are listed, but only abnormal results are displayed) Labs Reviewed - No data to display  EKG   Radiology No results found.  Procedures Procedures (including critical care time)  Medications Ordered in UC Medications - No data to display  Initial Impression / Assessment and Plan / UC Course  I have reviewed the triage vital signs and the nursing notes.  Pertinent labs & imaging results that were available during my care of the patient were reviewed by me and considered in my medical decision making (see chart for details).     No evidence of bony injury and no direct trauma, patient agreeable to forgoing x-ray imaging today. Suspect soft tissue strain and will treat with prednisone, flexeril, RICE and have f/u with Sports Medicine if not resolving for soft tissue imaging consideration and further mgmt.   Final Clinical Impressions(s) / UC Diagnoses   Final diagnoses:  Acute pain of left knee   Discharge Instructions   None    ED Prescriptions    Medication Sig Dispense Auth. Provider   cyclobenzaprine (FLEXERIL) 10 MG tablet Take 1 tablet (10 mg total) by mouth 3 (three) times daily as needed for muscle spasms. DO NOT DRINK ALCOHOL OR DRIVE WHILE TAKING THIS MEDICATION 30 tablet Volney American, PA-C   predniSONE (STERAPRED UNI-PAK 21 TAB) 10 MG (21) TBPK tablet Use as directed 21 tablet Volney American, [place]      PDMP not reviewed this encounter.   Volney American,  PA-C 10/22/20 1803

## 2020-11-09 ENCOUNTER — Other Ambulatory Visit (HOSPITAL_COMMUNITY): Payer: Self-pay | Admitting: Orthopedic Surgery

## 2020-11-09 DIAGNOSIS — M25562 Pain in left knee: Secondary | ICD-10-CM | POA: Diagnosis not present

## 2020-11-09 MED FILL — CYCLOBENZAPRINE HCL 10 MG T: 10 | 10 days supply | Qty: 30 | Fill #0

## 2020-11-09 MED FILL — MELOXICAM 15 MG TABLET: 15 | 30 days supply | Qty: 30 | Fill #0

## 2020-11-17 DIAGNOSIS — M25562 Pain in left knee: Secondary | ICD-10-CM | POA: Diagnosis not present

## 2020-11-23 DIAGNOSIS — M25562 Pain in left knee: Secondary | ICD-10-CM | POA: Diagnosis not present

## 2020-12-03 ENCOUNTER — Other Ambulatory Visit (HOSPITAL_COMMUNITY): Payer: Self-pay | Admitting: Physician Assistant

## 2020-12-03 MED FILL — CYCLOBENZAPRINE HCL 10 MG T: 10 | 10 days supply | Qty: 30 | Fill #0

## 2020-12-03 MED FILL — MELOXICAM 15 MG TABLET: 15 | 30 days supply | Qty: 30 | Fill #1

## 2020-12-07 DIAGNOSIS — M25562 Pain in left knee: Secondary | ICD-10-CM | POA: Diagnosis not present

## 2020-12-08 ENCOUNTER — Telehealth: Payer: Self-pay

## 2021-01-26 ENCOUNTER — Other Ambulatory Visit: Payer: Self-pay | Admitting: Physician Assistant

## 2021-01-26 ENCOUNTER — Other Ambulatory Visit: Payer: Self-pay | Admitting: Orthopedic Surgery

## 2021-01-27 ENCOUNTER — Other Ambulatory Visit: Payer: Self-pay | Admitting: Physician Assistant

## 2021-01-27 ENCOUNTER — Other Ambulatory Visit (HOSPITAL_COMMUNITY): Payer: Self-pay

## 2021-01-27 ENCOUNTER — Other Ambulatory Visit: Payer: Self-pay | Admitting: Orthopedic Surgery

## 2021-01-28 ENCOUNTER — Other Ambulatory Visit (HOSPITAL_COMMUNITY): Payer: Self-pay

## 2021-01-29 ENCOUNTER — Other Ambulatory Visit (HOSPITAL_COMMUNITY): Payer: Self-pay

## 2021-01-29 MED ORDER — CYCLOBENZAPRINE HCL 10 MG PO TABS
10.0000 mg | ORAL_TABLET | Freq: Three times a day (TID) | ORAL | 0 refills | Status: DC | PRN
  Filled 2021-01-29: qty 30, 10d supply, fill #0

## 2021-01-29 MED ORDER — MELOXICAM 15 MG PO TABS
15.0000 mg | ORAL_TABLET | Freq: Every day | ORAL | 1 refills | Status: DC
  Filled 2021-01-29: qty 30, 30d supply, fill #0
  Filled 2021-07-01: qty 30, 30d supply, fill #1

## 2021-02-10 ENCOUNTER — Telehealth: Payer: Self-pay | Admitting: Family

## 2021-02-16 NOTE — Telephone Encounter (Signed)
Left message for pt to schedule surgical clearance appt. Form is in drawer up front

## 2021-04-01 ENCOUNTER — Telehealth: Payer: Self-pay | Admitting: Family

## 2021-04-01 NOTE — Telephone Encounter (Signed)
FYI: Called and left message for patient to schedule appt to see provider for surgery clearance.  Received surgery clearance form and put it in record release box at front.

## 2021-04-09 ENCOUNTER — Telehealth: Payer: 59 | Admitting: Physician Assistant

## 2021-04-09 ENCOUNTER — Other Ambulatory Visit (HOSPITAL_COMMUNITY): Payer: Self-pay

## 2021-04-09 DIAGNOSIS — L2082 Flexural eczema: Secondary | ICD-10-CM | POA: Diagnosis not present

## 2021-04-09 MED ORDER — TRIAMCINOLONE ACETONIDE 0.1 % EX CREA
1.0000 "application " | TOPICAL_CREAM | Freq: Two times a day (BID) | CUTANEOUS | 0 refills | Status: DC
  Filled 2021-04-09: qty 30, 15d supply, fill #0

## 2021-04-09 NOTE — Progress Notes (Signed)

## 2021-05-03 ENCOUNTER — Telehealth: Payer: Self-pay | Admitting: Family

## 2021-05-11 ENCOUNTER — Other Ambulatory Visit (HOSPITAL_COMMUNITY): Payer: Self-pay

## 2021-05-11 ENCOUNTER — Telehealth: Payer: 59 | Admitting: Family Medicine

## 2021-05-11 DIAGNOSIS — J019 Acute sinusitis, unspecified: Secondary | ICD-10-CM | POA: Diagnosis not present

## 2021-05-11 MED ORDER — AMOXICILLIN-POT CLAVULANATE 875-125 MG PO TABS
1.0000 | ORAL_TABLET | Freq: Two times a day (BID) | ORAL | 0 refills | Status: AC
Start: 2021-05-11 — End: 2021-05-18
  Filled 2021-05-11: qty 14, 7d supply, fill #0

## 2021-05-11 NOTE — Progress Notes (Signed)
E-Visit for Sinus Problems  We are sorry that you are not feeling well.  Here is how we plan to help!  Based on what you have shared with me it looks like you have sinusitis.  Sinusitis is inflammation and infection in the sinus cavities of the head.  Based on your presentation I believe you most likely have Acute Bacterial Sinusitis.  This is an infection caused by bacteria and is treated with antibiotics. I have prescribed Augmentin '875mg'$ /'125mg'$  one tablet twice daily with food, for 7 days. You may use an oral decongestant such as Mucinex D or if you have glaucoma or high blood pressure use plain Mucinex. Saline nasal spray help and can safely be used as often as needed for congestion.  If you develop worsening sinus pain, fever or notice severe headache and vision changes, or if symptoms are not better after completion of antibiotic, please schedule an appointment with a health care provider.    Sinus infections are not as easily transmitted as other respiratory infection, however we still recommend that you avoid close contact with loved ones, especially the very young and elderly.  Remember to wash your hands thoroughly throughout the day as this is the number one way to prevent the spread of infection!  Home Care: Only take medications as instructed by your medical team. Complete the entire course of an antibiotic. Do not take these medications with alcohol. A steam or ultrasonic humidifier can help congestion.  You can place a towel over your head and breathe in the steam from hot water coming from a faucet. Avoid close contacts especially the very young and the elderly. Cover your mouth when you cough or sneeze. Always remember to wash your hands.  Get Help Right Away If: You develop worsening fever or sinus pain. You develop a severe head ache or visual changes. Your symptoms persist after you have completed your treatment plan.  Make sure you Understand these instructions. Will watch  your condition. Will get help right away if you are not doing well or get worse.  Thank you for choosing an e-visit.  Your e-visit answers were reviewed by a board certified advanced clinical practitioner to complete your personal care plan. Depending upon the condition, your plan could have included both over the counter or prescription medications.  Please review your pharmacy choice. Make sure the pharmacy is open so you can pick up prescription now. If there is a problem, you may contact your provider through [employer] and have the prescription routed to another pharmacy.  Your safety is important to [place]. If you have drug allergies check your prescription carefully.   For the next 24 hours you can use MyChart to ask questions about today's visit, request a non-urgent call back, or ask for a work or school excuse. You will get an email in the next two days asking about your experience. I hope that your e-visit has been valuable and will speed your recovery.  I provided 7 minutes of non face-to-face time during this encounter for chart review, medication and order placement, as well as and documentation.

## 2021-06-07 ENCOUNTER — Other Ambulatory Visit: Payer: Self-pay

## 2021-07-01 ENCOUNTER — Other Ambulatory Visit: Payer: Self-pay

## 2021-07-01 ENCOUNTER — Other Ambulatory Visit (HOSPITAL_COMMUNITY): Payer: Self-pay

## 2021-07-25 ENCOUNTER — Telehealth: Payer: 59 | Admitting: Emergency Medicine

## 2021-07-25 DIAGNOSIS — J069 Acute upper respiratory infection, unspecified: Secondary | ICD-10-CM

## 2021-07-25 MED ORDER — IPRATROPIUM BROMIDE 0.03 % NA SOLN: 2.0000 | Freq: Two times a day (BID) | NASAL | 0 refills | Status: DC | PRN

## 2021-07-25 MED ORDER — BENZONATATE 100 MG PO CAPS: 100.0000 mg | ORAL_CAPSULE | Freq: Two times a day (BID) | ORAL | 0 refills | Status: DC | PRN

## 2021-07-25 NOTE — Progress Notes (Signed)

## 2021-07-25 NOTE — Progress Notes (Signed)
I have spent 5 minutes in review of e-visit questionnaire, review and updating patient chart, medical decision making and response to patient.   Shaya Reddick, PA-C    

## 2021-07-28 ENCOUNTER — Telehealth: Payer: 59 | Admitting: Physician Assistant

## 2021-07-28 DIAGNOSIS — K0889 Other specified disorders of teeth and supporting structures: Secondary | ICD-10-CM

## 2021-07-28 MED ORDER — AMOXICILLIN 500 MG PO CAPS: 500.0000 mg | ORAL_CAPSULE | Freq: Three times a day (TID) | ORAL | 0 refills | Status: AC

## 2021-07-28 NOTE — Progress Notes (Signed)

## 2021-08-13 DIAGNOSIS — M25561 Pain in right knee: Secondary | ICD-10-CM | POA: Insufficient documentation

## 2021-08-25 ENCOUNTER — Other Ambulatory Visit (HOSPITAL_COMMUNITY): Payer: Self-pay

## 2021-08-25 MED ORDER — METHOCARBAMOL 500 MG PO TABS
500.0000 mg | ORAL_TABLET | Freq: Four times a day (QID) | ORAL | 0 refills | Status: DC | PRN
  Filled 2021-08-25: qty 60, 15d supply, fill #0

## 2021-09-03 ENCOUNTER — Other Ambulatory Visit (HOSPITAL_COMMUNITY): Payer: Self-pay

## 2021-09-03 ENCOUNTER — Telehealth: Payer: 59 | Admitting: Physician Assistant

## 2021-09-03 DIAGNOSIS — L2082 Flexural eczema: Secondary | ICD-10-CM | POA: Diagnosis not present

## 2021-09-03 MED ORDER — TRIAMCINOLONE ACETONIDE 0.1 % EX CREA
1.0000 "application " | TOPICAL_CREAM | Freq: Two times a day (BID) | CUTANEOUS | 0 refills | Status: DC
  Filled 2021-09-03: qty 30, 15d supply, fill #0

## 2021-09-03 NOTE — Progress Notes (Signed)

## 2021-10-21 ENCOUNTER — Other Ambulatory Visit (HOSPITAL_COMMUNITY): Payer: Self-pay

## 2021-10-21 DIAGNOSIS — M25562 Pain in left knee: Secondary | ICD-10-CM | POA: Insufficient documentation

## 2021-10-21 MED ORDER — PREDNISONE 5 MG PO TABS
ORAL_TABLET | ORAL | 0 refills | Status: DC
  Filled 2021-10-21: qty 21, 6d supply, fill #0

## 2021-10-21 MED ORDER — METHOCARBAMOL 500 MG PO TABS
500.0000 mg | ORAL_TABLET | Freq: Four times a day (QID) | ORAL | 0 refills | Status: DC | PRN
  Filled 2021-10-21: qty 24, 6d supply, fill #0

## 2021-11-11 DIAGNOSIS — M25561 Pain in right knee: Secondary | ICD-10-CM | POA: Diagnosis not present

## 2021-11-11 DIAGNOSIS — M25562 Pain in left knee: Secondary | ICD-10-CM | POA: Diagnosis not present

## 2021-11-13 DIAGNOSIS — H5203 Hypermetropia, bilateral: Secondary | ICD-10-CM | POA: Diagnosis not present

## 2021-11-22 DIAGNOSIS — M25562 Pain in left knee: Secondary | ICD-10-CM | POA: Diagnosis not present

## 2021-12-14 ENCOUNTER — Telehealth: Payer: 59 | Admitting: Physician Assistant

## 2021-12-14 DIAGNOSIS — J069 Acute upper respiratory infection, unspecified: Secondary | ICD-10-CM

## 2021-12-14 MED ORDER — FLUTICASONE PROPIONATE 50 MCG/ACT NA SUSP: 2.0000 | Freq: Every day | NASAL | 0 refills | Status: DC

## 2021-12-14 MED ORDER — BENZONATATE 100 MG PO CAPS: 100.0000 mg | ORAL_CAPSULE | Freq: Three times a day (TID) | ORAL | 0 refills | Status: DC | PRN

## 2021-12-14 NOTE — Progress Notes (Signed)
E-Visit for Upper Respiratory Infection  ? ?We are sorry you are not feeling well.  Here is how we plan to help! ? ?Based on what you have shared with me, it looks like you may have a viral upper respiratory infection.  Upper respiratory infections are caused by a large number of viruses; however, rhinovirus is the most common cause.  ? ?I highly recommend retested for COVID at least 24 hours after most recent test giving known exposure and concern for an initial false-negative test.  ? ?Symptoms vary from person to person, with common symptoms including sore throat, cough, fatigue or lack of energy and feeling of general discomfort.  A low-grade fever of up to 100.4 may present, but is often uncommon.  Symptoms vary however, and are closely related to a person's age or underlying illnesses.  The most common symptoms associated with an upper respiratory infection are nasal discharge or congestion, cough, sneezing, headache and pressure in the ears and face.  These symptoms usually persist for about 3 to 10 days, but can last up to 2 weeks.  It is important to know that upper respiratory infections do not cause serious illness or complications in most cases.   ? ?Upper respiratory infections can be transmitted from person to person, with the most common method of transmission being a person's hands.  The virus is able to live on the skin and can infect other persons for up to 2 hours after direct contact.  Also, these can be transmitted when someone coughs or sneezes; thus, it is important to cover the mouth to reduce this risk.  To keep the spread of the illness at [place], good hand hygiene is very important. ? ?This is an infection that is most likely caused by a virus. There are no specific treatments other than to help you with the symptoms until the infection runs its course.  We are sorry you are not feeling well.  Here is how we plan to help! ? ? ?For nasal congestion, you may use an oral decongestants such as  Mucinex D or if you have glaucoma or high blood pressure use plain Mucinex.  Saline nasal spray or nasal drops can help and can safely be used as often as needed for congestion.  For your congestion, I have prescribed Fluticasone nasal spray one spray in each nostril twice a day ? ?If you do not have a history of heart disease, hypertension, diabetes or thyroid disease, prostate/bladder issues or glaucoma, you may also use Sudafed to treat nasal congestion.  It is highly recommended that you consult with a pharmacist or your primary care physician to ensure this medication is safe for you to take.    ? ?If you have a cough, you may use cough suppressants such as Delsym and Robitussin.  If you have glaucoma or high blood pressure, you can also use Coricidin HBP.   ?For cough I have prescribed for you A prescription cough medication called Tessalon Perles 100 mg. You may take 1-2 capsules every 8 hours as needed for cough ? ?If you have a sore or scratchy throat, use a saltwater gargle- ? to ? teaspoon of salt dissolved in a 4-ounce to 8-ounce glass of warm water.  Gargle the solution for approximately 15-30 seconds and then spit.  It is important not to swallow the solution.  You can also use throat lozenges/cough drops and Chloraseptic spray to help with throat pain or discomfort.  Warm or cold liquids can also  be helpful in relieving throat pain. ? ?For headache, pain or general discomfort, you can use Ibuprofen or Tylenol as directed.   ?Some authorities believe that zinc sprays or the use of Echinacea may shorten the course of your symptoms. ? ? ?HOME CARE ?Only take medications as instructed by your medical team. ?Be sure to drink plenty of fluids. Water is fine as well as fruit juices, sodas and electrolyte beverages. You may want to stay away from caffeine or alcohol. If you are nauseated, try taking small sips of liquids. How do you know if you are getting enough fluid? Your urine should be a pale yellow or  almost colorless. ?Get rest. ?Taking a steamy shower or using a humidifier may help nasal congestion and ease sore throat pain. You can place a towel over your head and breathe in the steam from hot water coming from a faucet. ?Using a saline nasal spray works much the same way. ?Cough drops, hard candies and sore throat lozenges may ease your cough. ?Avoid close contacts especially the very young and the elderly ?Cover your mouth if you cough or sneeze ?Always remember to wash your hands.  ? ?GET HELP RIGHT AWAY IF: ?You develop worsening fever. ?If your symptoms do not improve within 10 days ?You develop yellow or green discharge from your nose over 3 days. ?You have coughing fits ?You develop a severe head ache or visual changes. ?You develop shortness of breath, difficulty breathing or start having chest pain ?Your symptoms persist after you have completed your treatment plan ? ?MAKE SURE YOU  ?Understand these instructions. ?Will watch your condition. ?Will get help right away if you are not doing well or get worse. ? ?Thank you for choosing an e-visit. ? ?Your e-visit answers were reviewed by a board certified advanced clinical practitioner to complete your personal care plan. Depending upon the condition, your plan could have included both over the counter or prescription medications. ? ?Please review your pharmacy choice. Make sure the pharmacy is open so you can pick up prescription now. If there is a problem, you may contact your provider through [employer] and have the prescription routed to another pharmacy.  Your safety is important to [place]. If you have drug allergies check your prescription carefully.  ? ?For the next 24 hours you can use MyChart to ask questions about today's visit, request a non-urgent call back, or ask for a work or school excuse. ?You will get an email in the next two days asking about your experience. I hope that your e-visit has been valuable and will speed your  recovery. ? ? ? ? ?

## 2021-12-14 NOTE — Progress Notes (Signed)
I have spent 5 minutes in review of e-visit questionnaire, review and updating patient chart, medical decision making and response to patient.   Willard Madrigal Cody Rei Contee, PA-C    

## 2021-12-15 DIAGNOSIS — J0140 Acute pansinusitis, unspecified: Secondary | ICD-10-CM | POA: Diagnosis not present

## 2021-12-15 DIAGNOSIS — Z20822 Contact with and (suspected) exposure to covid-19: Secondary | ICD-10-CM | POA: Diagnosis not present

## 2021-12-15 DIAGNOSIS — R051 Acute cough: Secondary | ICD-10-CM | POA: Diagnosis not present

## 2021-12-15 DIAGNOSIS — Z03818 Encounter for observation for suspected exposure to other biological agents ruled out: Secondary | ICD-10-CM | POA: Diagnosis not present

## 2021-12-15 DIAGNOSIS — R519 Headache, unspecified: Secondary | ICD-10-CM | POA: Diagnosis not present

## 2021-12-15 DIAGNOSIS — H66001 Acute suppurative otitis media without spontaneous rupture of ear drum, right ear: Secondary | ICD-10-CM | POA: Diagnosis not present

## 2021-12-20 ENCOUNTER — Telehealth: Payer: 59 | Admitting: Physician Assistant

## 2021-12-20 DIAGNOSIS — Z20822 Contact with and (suspected) exposure to covid-19: Secondary | ICD-10-CM

## 2021-12-20 DIAGNOSIS — R059 Cough, unspecified: Secondary | ICD-10-CM

## 2021-12-20 NOTE — Progress Notes (Signed)
Based on what you shared with me, I feel your condition warrants further evaluation and I recommend that you be seen in a face to face visit. ? ?I recommend a face to face visit so that you can have a provider listen to your lungs and potentially get a chest xray given the duration of your symptoms. I also recommend repeat covid testing. ?  ?NOTE: There will be NO CHARGE for this eVisit ?  ?If you are having a true medical emergency please call 911.   ?  ? For an urgent face to face visit, [facility] has six urgent care centers for your convenience:  ?  ? [facility] ?Get Driving Directions ?[phone] ?[address] ?[place], [place] [address] ?  ? [facility]) ?Get Driving Directions ?[phone] ?[address] ?[place], [place] [address] ? ?[facility] [place] [facility]) ?Get Driving Directions ?[place]  [id number] ? ?[facility] ?Get Driving Directions ?[phone] ?[address] ?[place], [place] [address] ?  ?[facility] ?Get Driving Directions  ?[phone] ?[address] ?[address] ?[place], [place] [address] ?  ?[facility] ?Get Driving Directions ?[phone] ?[address] ?[place], [place] [address] ? ?Your MyChart E-visit questionnaire answers were reviewed by a board certified advanced clinical practitioner to complete your personal care plan based on your specific symptoms.  Thank you for using e-Visits. ?  ?Approximately 5 minutes was spent documenting and reviewing patient's chart. ? ?

## 2021-12-22 ENCOUNTER — Ambulatory Visit (INDEPENDENT_AMBULATORY_CARE_PROVIDER_SITE_OTHER): Payer: 59 | Admitting: Nurse Practitioner

## 2021-12-22 ENCOUNTER — Other Ambulatory Visit: Payer: Self-pay | Admitting: *Deleted

## 2021-12-22 ENCOUNTER — Encounter: Payer: Self-pay | Admitting: Nurse Practitioner

## 2021-12-22 ENCOUNTER — Ambulatory Visit (INDEPENDENT_AMBULATORY_CARE_PROVIDER_SITE_OTHER): Payer: 59

## 2021-12-22 VITALS — BP 154/80 | HR 104 | Temp 98.2°F | Ht 69.0 in | Wt 299.0 lb

## 2021-12-22 DIAGNOSIS — R062 Wheezing: Secondary | ICD-10-CM | POA: Diagnosis not present

## 2021-12-22 DIAGNOSIS — J069 Acute upper respiratory infection, unspecified: Secondary | ICD-10-CM

## 2021-12-22 DIAGNOSIS — R051 Acute cough: Secondary | ICD-10-CM

## 2021-12-22 MED ORDER — BENZONATATE 100 MG PO CAPS: 200.0000 mg | ORAL_CAPSULE | Freq: Three times a day (TID) | ORAL | 0 refills | Status: DC | PRN

## 2021-12-22 MED ORDER — ALBUTEROL SULFATE HFA 108 (90 BASE) MCG/ACT IN AERS: 2.0000 | INHALATION_SPRAY | Freq: Four times a day (QID) | RESPIRATORY_TRACT | 2 refills | Status: DC | PRN

## 2021-12-22 MED ORDER — DM-GUAIFENESIN ER 30-600 MG PO TB12: 1.0000 | ORAL_TABLET | Freq: Two times a day (BID) | ORAL | 0 refills | Status: DC

## 2021-12-22 MED ORDER — PREDNISONE 10 MG (21) PO TBPK: ORAL_TABLET | ORAL | 0 refills | Status: DC

## 2021-12-22 MED ORDER — BENZONATATE 200 MG PO CAPS: 200.0000 mg | ORAL_CAPSULE | Freq: Three times a day (TID) | ORAL | 0 refills | Status: DC | PRN

## 2021-12-22 NOTE — Patient Instructions (Signed)
Sinusitis, Adult ?Sinusitis is soreness and swelling (inflammation) of your sinuses. Sinuses are hollow spaces in the bones around your face. They are located: ?Around your eyes. ?In the middle of your forehead. ?Behind your nose. ?In your cheekbones. ?Your sinuses and nasal passages are lined with a fluid called mucus. Mucus drains out of your sinuses. Swelling can trap mucus in your sinuses. This lets germs (bacteria, virus, or fungus) grow, which leads to infection. Most of the time, this condition is caused by a virus. ?What are the causes? ?This condition is caused by: ?Allergies. ?Asthma. ?Germs. ?Things that block your nose or sinuses. ?Growths in the nose (nasal polyps). ?Chemicals or irritants in the air. ?Fungus (rare). ?What increases the risk? ?You are more likely to develop this condition if: ?You have a weak body defense system (immune system). ?You do a lot of swimming or diving. ?You use nasal sprays too much. ?You smoke. ?What are the signs or symptoms? ?The main symptoms of this condition are pain and a feeling of pressure around the sinuses. Other symptoms include: ?Stuffy nose (congestion). ?Runny nose (drainage). ?Swelling and warmth in the sinuses. ?Headache. ?Toothache. ?A cough that may get worse at night. ?Mucus that collects in the throat or the back of the nose (postnasal drip). ?Being unable to smell and taste. ?Being very tired (fatigue). ?A fever. ?Sore throat. ?Bad breath. ?How is this diagnosed? ?This condition is diagnosed based on: ?Your symptoms. ?Your medical history. ?A physical exam. ?Tests to find out if your condition is short-term (acute) or long-term (chronic). Your doctor may: ?Check your nose for growths (polyps). ?Check your sinuses using a tool that has a light (endoscope). ?Check for allergies or germs. ?Do imaging tests, such as an MRI or CT scan. ?How is this treated? ?Treatment for this condition depends on the cause and whether it is short-term or long-term. ?If  caused by a virus, your symptoms should go away on their own within 10 days. You may be given medicines to relieve symptoms. They include: ?Medicines that shrink swollen tissue in the nose. ?Medicines that treat allergies (antihistamines). ?A spray that treats swelling of the nostrils.  ?Rinses that help get rid of thick mucus in your nose (nasal saline washes). ?If caused by bacteria, your doctor may wait to see if you will get better without treatment. You may be given antibiotic medicine if you have: ?A very bad infection. ?A weak body defense system. ?If caused by growths in the nose, you may need to have surgery. ?Follow these instructions at home: ?Medicines ?Take, use, or apply over-the-counter and prescription medicines only as told by your doctor. These may include nasal sprays. ?If you were prescribed an antibiotic medicine, take it as told by your doctor. Do not stop taking the antibiotic even if you start to feel better. ?Hydrate and humidify ? ?Drink enough water to keep your pee (urine) pale yellow. ?Use a cool mist humidifier to keep the humidity level in your home above 50%. ?Breathe in steam for 10-15 minutes, 3-4 times a day, or as told by your doctor. You can do this in the bathroom while a hot shower is running. ?Try not to spend time in cool or dry air. ?Rest ?Rest as much as you can. ?Sleep with your head raised (elevated). ?Make sure you get enough sleep each night. ?General instructions ? ?Put a warm, moist washcloth on your face 3-4 times a day, or as often as told by your doctor. This will help with discomfort. ?  Wash your hands often with soap and water. If there is no soap and water, use hand sanitizer. ?Do not smoke. Avoid being around people who are smoking (secondhand smoke). ?Keep all follow-up visits as told by your doctor. This is important. ?Contact a doctor if: ?You have a fever. ?Your symptoms get worse. ?Your symptoms do not get better within 10 days. ?Get help right away  if: ?You have a very bad headache. ?You cannot stop throwing up (vomiting). ?You have very bad pain or swelling around your face or eyes. ?You have trouble seeing. ?You feel confused. ?Your neck is stiff. ?You have trouble breathing. ?Summary ?Sinusitis is swelling of your sinuses. Sinuses are hollow spaces in the bones around your face. ?This condition is caused by tissues in your nose that become inflamed or swollen. This traps germs. These can lead to infection. ?If you were prescribed an antibiotic medicine, take it as told by your doctor. Do not stop taking it even if you start to feel better. ?Keep all follow-up visits as told by your doctor. This is important. ?This information is not intended to replace advice given to you by your health care provider. Make sure you discuss any questions you have with your health care provider. ?Document Revised: 02/12/2018 Document Reviewed: 02/12/2018 ?Elsevier Patient Education ? [place]. ?Cough, Adult ?A cough helps to clear your throat and lungs. A cough may be a sign of an illness or another medical condition. ?An acute cough may only last 2-3 weeks, while a chronic cough may last 8 or more weeks. ?Many things can cause a cough. They include: ?Germs (viruses or bacteria) that attack the airway. ?Breathing in things that bother (irritate) your lungs. ?Allergies. ?Asthma. ?Mucus that runs down the back of your throat (postnasal drip). ?Smoking. ?Acid backing up from the stomach into the tube that moves food from the mouth to the stomach (gastroesophageal reflux). ?Some medicines. ?Lung problems. ?Other medical conditions, such as heart failure or a blood clot in the lung (pulmonary embolism). ?Follow these instructions at home: ?Medicines ?Take over-the-counter and prescription medicines only as told by your doctor. ?Talk with your doctor before you take medicines that stop a cough (cough suppressants). ?Lifestyle ? ?Do not smoke, and try not to be around  smoke. Do not use any products that contain nicotine or tobacco, such as cigarettes, e-cigarettes, and chewing tobacco. If you need help quitting, ask your doctor. ?Drink enough fluid to keep your pee (urine) pale yellow. ?Avoid caffeine. ?Do not drink alcohol if your doctor tells you not to drink. ?General instructions ? ?Watch for any changes in your cough. Tell your doctor about them. ?Always cover your mouth when you cough. ?Stay away from things that make you cough, such as perfume, candles, campfire smoke, or cleaning products. ?If the air is dry, use a cool mist vaporizer or humidifier in your home. ?If your cough is worse at night, try using extra pillows to raise your head up higher while you sleep. ?Rest as needed. ?Keep all follow-up visits as told by your doctor. This is important. ?Contact a doctor if: ?You have new symptoms. ?You cough up pus. ?Your cough does not get better after 2-3 weeks, or your cough gets worse. ?Cough medicine does not help your cough and you are not sleeping well. ?You have pain that gets worse or pain that is not helped with medicine. ?You have a fever. ?You are losing weight and you do not know why. ?You have night sweats. ?  Get help right away if: ?You cough up blood. ?You have trouble breathing. ?Your heartbeat is very fast. ?These symptoms may be an emergency. Do not wait to see if the symptoms will go away. Get medical help right away. Call your local emergency services (911 in the [place] Do not drive yourself to the hospital. ?Summary ?A cough helps to clear your throat and lungs. Many things can cause a cough. ?Take over-the-counter and prescription medicines only as told by your doctor. ?Always cover your mouth when you cough. ?Contact a doctor if you have new symptoms or you have a cough that does not get better or gets worse. ?This information is not intended to replace advice given to you by your health care provider. Make sure you discuss any questions you have with  your health care provider. ?Document Revised: 11/01/2019 Document Reviewed: 10/01/2018 ?Elsevier Patient Education ? [place]. ? ?

## 2021-12-22 NOTE — Progress Notes (Signed)
? ?Acute Office Visit ? ?Subjective:  ? ? Patient ID: Linda Wilson, female    DOB: Nov 16, 1970, 51 y.o.   MRN: [id number] ? ?Chief Complaint  ?Patient presents with  ? URI  ?  Cough, ear pressure, no taste, no smell - went to urgent care [facility] and covid, flu and strep all NEG   ? ? ?URI  ?This is a new problem. Episode onset: in the past 3 days. Associated symptoms include congestion, coughing and wheezing. Pertinent negatives include no chest pain or sore throat.  ?Cough ?This is a new problem. The current episode started in the past 7 days. The problem has been gradually worsening. The problem occurs constantly. The cough is Productive of sputum. Associated symptoms include chills, a fever, nasal congestion and wheezing. Pertinent negatives include no chest pain, ear congestion or sore throat. She has tried OTC cough suppressant and cool air for the symptoms. The treatment provided no relief.  ?Wheezing  ?This is a new problem. The current episode started yesterday. The problem occurs constantly. The problem has been unchanged. Associated symptoms include chills, coughing and a fever. Pertinent negatives include no chest pain or sore throat. The symptoms are aggravated by URIs. She has tried nothing for the symptoms.  ? ? ?History reviewed. No pertinent past medical history. ? ?Past Surgical History:  ?Procedure Laterality Date  ? CESAREAN SECTION    ? TUBAL LIGATION    ? ? ?Family History  ?Problem Relation Age of Onset  ? Healthy Mother   ? Heart disease Father   ?     MI  ? Diabetes Father   ? Stroke Father   ? ? ?Social History  ? ?Socioeconomic History  ? Marital status: Single  ?  Spouse name: Not on file  ? Number of children: Not on file  ? Years of education: Not on file  ? Highest education level: Not on file  ?Occupational History  ? Not on file  ?Tobacco Use  ? Smoking status: Never  ? Smokeless tobacco: Never  ?Substance and Sexual Activity  ? Alcohol use: No  ? Drug use: No  ? Sexual activity: Yes   ?Other Topics Concern  ? Not on file  ?Social History Narrative  ? Not on file  ? ?Social Determinants of Health  ? ?Financial Resource Strain: Not on file  ?Food Insecurity: Not on file  ?Transportation Needs: Not on file  ?Physical Activity: Not on file  ?Stress: Not on file  ?Social Connections: Not on file  ?Intimate Partner Violence: Not on file  ? ? ?Outpatient Medications Prior to Visit  ?Medication Sig Dispense Refill  ? amoxicillin-clavulanate (AUGMENTIN) 875-125 MG tablet Take 1 tablet by mouth 2 (two) times daily.    ? fluticasone (FLONASE) 50 MCG/ACT nasal spray Place 2 sprays into both nostrils daily. 16 g 0  ? methocarbamol (ROBAXIN) 500 MG tablet Take 1 tablet (500 mg total) by mouth every 6 (six) hours as needed for spasms. 24 tablet 0  ? benzonatate (TESSALON) 100 MG capsule Take 1 capsule (100 mg total) by mouth 3 (three) times daily as needed for cough. 30 capsule 0  ? cyclobenzaprine (FLEXERIL) 10 MG tablet Take 1 tablet (10 mg total) by mouth 3 (three) times daily as needed for muscle spasms. DO NOT DRINK ALCOHOL OR DRIVE WHILE TAKING THIS MEDICATION 30 tablet 0  ? meloxicam (MOBIC) 15 MG tablet Take 1 tablet (15 mg total) by mouth daily. 30 tablet 1  ? mupirocin  ointment (BACTROBAN) 2 % Apply 1 application topically 2 (two) times daily. 22 g 0  ? triamcinolone cream (KENALOG) 0.1 % Apply 1 application topically 2 (two) times daily. 30 g 0  ? ?No facility-administered medications prior to visit.  ? ? ?No Known Allergies ? ?Review of Systems  ?Constitutional:  Positive for chills and fever.  ?HENT:  Positive for congestion. Negative for sinus pressure and sore throat.   ?Respiratory:  Positive for cough and wheezing.   ?Cardiovascular:  Negative for chest pain.  ?Gastrointestinal: Negative.   ? ?   ?Objective:  ?  ?Physical Exam ?Vitals and nursing note reviewed.  ?Constitutional:   ?   Appearance: She is obese.  ?HENT:  ?   Head: Normocephalic.  ?   Right Ear: External ear normal.  ?   Left  Ear: External ear normal.  ?   Nose: Congestion present.  ?   Mouth/Throat:  ?   Mouth: Mucous membranes are moist.  ?Eyes:  ?   Conjunctiva/sclera: Conjunctivae normal.  ?Cardiovascular:  ?   Rate and Rhythm: Normal rate and regular rhythm.  ?   Pulses: Normal pulses.  ?   Heart sounds: Normal heart sounds.  ?Pulmonary:  ?   Effort: Pulmonary effort is normal.  ?   Breath sounds: Normal breath sounds.  ?Abdominal:  ?   General: Bowel sounds are normal.  ?Musculoskeletal:     ?   General: Normal range of motion.  ?Skin: ?   General: Skin is warm.  ?   Findings: No rash.  ?Neurological:  ?   General: No focal deficit present.  ?   Mental Status: She is alert and oriented to person, place, and time.  ?Psychiatric:     ?   Mood and Affect: Mood normal.     ?   Behavior: Behavior normal.  ? ? ?BP (!) 154/80   Pulse (!) 104   Temp 98.2 ?F (36.8 ?C)   Ht '5\' 9"'$  (1.753 m)   Wt 299 lb (135.6 kg)   LMP 11/09/2014   SpO2 98%   BMI 44.15 kg/m?  ?Wt Readings from Last 3 Encounters:  ?12/22/21 299 lb (135.6 kg)  ?10/22/20 289 lb (131.1 kg)  ?03/26/20 294 lb 9.6 oz (133.6 kg)  ? ? ?Health Maintenance Due  ?Topic Date Due  ? COVID-19 Vaccine (1) Never done  ? HIV Screening  Never done  ? Hepatitis C Screening  Never done  ? TETANUS/TDAP  Never done  ? COLONOSCOPY (Pts 45-67yr Insurance coverage will need to be confirmed)  Never done  ? PAP SMEAR-Modifier  04/26/2017  ? MAMMOGRAM  12/17/2020  ? Zoster Vaccines- Shingrix (1 of 2) Never done  ? INFLUENZA VACCINE  04/26/2021  ? ? ?There are no preventive care reminders to display for this patient. ? ? ?Lab Results  ?Component Value Date  ? TSH 2.450 03/26/2020  ? ?Lab Results  ?Component Value Date  ? WBC 9.2 03/26/2020  ? HGB 9.3 (L) 03/26/2020  ? HCT 32.7 (L) 03/26/2020  ? MCV 65 (L) 03/26/2020  ? PLT 336 03/26/2020  ? ? ? ?   ?Assessment & Plan:  ?Take meds as prescribed ?- Use a cool mist humidifier  ?-Use saline nose sprays frequently ?-Force fluids ?-For fever or aches or  pains- take Tylenol or ibuprofen. ?-At home COVID-19 test negative ?-complete current antibiotic ?-increase Tessalon pearls to 200 mg tablet by mouth  ?-Muccinex DM for cough and congestion ?-Albuterol for  wheezing ?-prednisone taper ?-If symptoms do not improve, she may need to be COVID tested to rule this out ?Follow up with worsening unresolved symptoms  ? ?Problem List Items Addressed This Visit   ?None ?Visit Diagnoses   ? ? Acute cough    -  Primary  ? Relevant Medications  ? albuterol (VENTOLIN HFA) 108 (90 Base) MCG/ACT inhaler  ? benzonatate (TESSALON PERLES) 100 MG capsule  ? dextromethorphan-guaiFENesin (MUCINEX DM) 30-600 MG 12hr tablet  ? Other Relevant Orders  ? DG Chest 2 View  ? Upper respiratory tract infection, unspecified type      ? Relevant Medications  ? predniSONE (STERAPRED UNI-PAK 21 TAB) 10 MG (21) TBPK tablet  ? Other Relevant Orders  ? DG Chest 2 View  ? ?  ? ? ? ?Meds ordered this encounter  ?Medications  ? albuterol (VENTOLIN HFA) 108 (90 Base) MCG/ACT inhaler  ?  Sig: Inhale 2 puffs into the lungs every 6 (six) hours as needed for wheezing or shortness of breath.  ?  Dispense:  8 g  ?  Refill:  2  ?  Order Specific Question:   Supervising Provider  ?  AnswerClaretta Fraise [[id number]]  ? predniSONE (STERAPRED UNI-PAK 21 TAB) 10 MG (21) TBPK tablet  ?  Sig: 6 tablets day 1, 5 tablet day 2, 4 tablet day 3, 3 tablet before, 2 tablet day 5, 1 tablet day 6  ?  Dispense:  1 each  ?  Refill:  0  ?  Order Specific Question:   Supervising Provider  ?  AnswerClaretta Fraise [[id number]]  ? benzonatate (TESSALON PERLES) 100 MG capsule  ?  Sig: Take 2 capsules (200 mg total) by mouth 3 (three) times daily as needed.  ?  Dispense:  20 capsule  ?  Refill:  0  ?  Order Specific Question:   Supervising Provider  ?  AnswerClaretta Fraise [[id number]]  ? dextromethorphan-guaiFENesin (MUCINEX DM) 30-600 MG 12hr tablet  ?  Sig: Take 1 tablet by mouth 2 (two) times daily.  ?  Dispense:  30 tablet  ?  Refill:   0  ?  Order Specific Question:   Supervising Provider  ?  AnswerClaretta Fraise [[id number]]  ? ? ? ?Ivy Lynn, NP ? ?

## 2021-12-27 ENCOUNTER — Ambulatory Visit: Payer: 59 | Admitting: Podiatry

## 2022-01-10 ENCOUNTER — Other Ambulatory Visit (HOSPITAL_COMMUNITY): Payer: Self-pay

## 2022-01-10 MED ORDER — DICLOFENAC SODIUM 75 MG PO TBEC
75.0000 mg | DELAYED_RELEASE_TABLET | Freq: Two times a day (BID) | ORAL | 1 refills | Status: DC
  Filled 2022-01-10: qty 60, 30d supply, fill #0

## 2022-02-03 ENCOUNTER — Other Ambulatory Visit (HOSPITAL_COMMUNITY): Payer: Self-pay

## 2022-02-03 DIAGNOSIS — L309 Dermatitis, unspecified: Secondary | ICD-10-CM | POA: Diagnosis not present

## 2022-02-03 DIAGNOSIS — D485 Neoplasm of uncertain behavior of skin: Secondary | ICD-10-CM | POA: Diagnosis not present

## 2022-02-03 DIAGNOSIS — C44519 Basal cell carcinoma of skin of other part of trunk: Secondary | ICD-10-CM | POA: Diagnosis not present

## 2022-02-03 DIAGNOSIS — L409 Psoriasis, unspecified: Secondary | ICD-10-CM | POA: Diagnosis not present

## 2022-02-03 MED ORDER — CLOBETASOL PROPIONATE 0.05 % EX CREA
1.0000 "application " | TOPICAL_CREAM | Freq: Two times a day (BID) | CUTANEOUS | 1 refills | Status: DC
  Filled 2022-02-03: qty 45, 20d supply, fill #0
  Filled 2022-07-06: qty 45, 20d supply, fill #1

## 2022-02-03 MED ORDER — CLOBETASOL PROPIONATE 0.05 % EX CREA
1.0000 "application " | TOPICAL_CREAM | Freq: Two times a day (BID) | CUTANEOUS | 1 refills | Status: DC
  Filled 2022-02-03: qty 45, 20d supply, fill #0
  Filled 2022-03-17: qty 45, 30d supply, fill #0

## 2022-02-09 DIAGNOSIS — M25562 Pain in left knee: Secondary | ICD-10-CM | POA: Diagnosis not present

## 2022-02-09 DIAGNOSIS — M2242 Chondromalacia patellae, left knee: Secondary | ICD-10-CM | POA: Diagnosis not present

## 2022-02-09 DIAGNOSIS — S83282A Other tear of lateral meniscus, current injury, left knee, initial encounter: Secondary | ICD-10-CM | POA: Diagnosis not present

## 2022-02-09 DIAGNOSIS — M25561 Pain in right knee: Secondary | ICD-10-CM | POA: Diagnosis not present

## 2022-02-20 DIAGNOSIS — S83289A Other tear of lateral meniscus, current injury, unspecified knee, initial encounter: Secondary | ICD-10-CM | POA: Insufficient documentation

## 2022-02-20 DIAGNOSIS — M2242 Chondromalacia patellae, left knee: Secondary | ICD-10-CM | POA: Insufficient documentation

## 2022-03-03 DIAGNOSIS — C44519 Basal cell carcinoma of skin of other part of trunk: Secondary | ICD-10-CM | POA: Diagnosis not present

## 2022-03-17 ENCOUNTER — Other Ambulatory Visit (HOSPITAL_COMMUNITY): Payer: Self-pay

## 2022-06-03 ENCOUNTER — Telehealth: Payer: Self-pay | Admitting: Family

## 2022-06-03 ENCOUNTER — Telehealth: Payer: 59 | Admitting: Family

## 2022-06-03 ENCOUNTER — Encounter: Payer: Self-pay | Admitting: Family

## 2022-06-03 DIAGNOSIS — J069 Acute upper respiratory infection, unspecified: Secondary | ICD-10-CM

## 2022-06-03 MED ORDER — MOLNUPIRAVIR EUA 200MG CAPSULE: 4.0000 | ORAL_CAPSULE | Freq: Two times a day (BID) | ORAL | 0 refills | Status: AC

## 2022-06-03 MED ORDER — BENZONATATE 200 MG PO CAPS: 200.0000 mg | ORAL_CAPSULE | Freq: Three times a day (TID) | ORAL | 1 refills | Status: DC | PRN

## 2022-06-03 NOTE — Telephone Encounter (Signed)
Pt called to let [name] know that her at home COVID test has a very faint line so she is unsure if its positive or not.  Says her nurse at work says she needs to have a PCR test done.   Please advise and call patient.

## 2022-06-03 NOTE — Addendum Note (Signed)
Addended by: Evelina Dun A on: 06/03/2022 10:47 AM   Modules accepted: Orders

## 2022-06-03 NOTE — Telephone Encounter (Signed)
Lmtcb.

## 2022-06-03 NOTE — Telephone Encounter (Signed)
Pt returned missed call. Reviewed providers note with pt. Pt voiced understanding. Will pick up her medicine at the pharmacy and come by the office to be tested since work is requiring she have it done.

## 2022-06-03 NOTE — Progress Notes (Addendum)
Virtual Visit Consent   Linda Wilson, you are scheduled for a virtual visit with a [facility] provider today. Just as with appointments in the office, your consent must be obtained to participate. Your consent will be active for this visit and any virtual visit you may have with one of our providers in the next 365 days. If you have a MyChart account, a copy of this consent can be sent to you electronically.  As this is a virtual visit, video technology does not allow for your provider to perform a traditional examination. This may limit your provider's ability to fully assess your condition. If your provider identifies any concerns that need to be evaluated in person or the need to arrange testing (such as labs, EKG, etc.), we will make arrangements to do so. Although advances in technology are sophisticated, we cannot ensure that it will always work on either your end or our end. If the connection with a video visit is poor, the visit may have to be switched to a telephone visit. With either a video or telephone visit, we are not always able to ensure that we have a secure connection.  By engaging in this virtual visit, you consent to the provision of healthcare and authorize for your insurance to be billed (if applicable) for the services provided during this visit. Depending on your insurance coverage, you may receive a charge related to this service.  I need to obtain your verbal consent now. Are you willing to proceed with your visit today? Linda Wilson has provided verbal consent on 06/03/2022 for a virtual visit (video or telephone). Evelina Dun, FNP  Date: 06/03/2022 9:13 AM  Virtual Visit via Video Note   I, Evelina Dun, connected with  Linda Wilson  ([id number], May 25, 1971) on 06/03/22 at  9:40 AM EDT by a video-enabled telemedicine application and verified that I am speaking with the correct person using two identifiers.  Location: Patient: Virtual Visit Location Patient:  Other: car Provider: Virtual Visit Location Provider: Home Office   I discussed the limitations of evaluation and management by telemedicine and the availability of in person appointments. The patient expressed understanding and agreed to proceed.    History of Present Illness: Linda Wilson is a 51 y.o. who identifies as a female who was assigned female at birth, and is being seen today for cough.  HPI: Cough This is a new problem. The current episode started yesterday. The problem has been gradually worsening. The problem occurs every few minutes. The cough is Productive of sputum. Associated symptoms include chills, ear congestion, ear pain, headaches, myalgias, nasal congestion, postnasal drip, a sore throat, shortness of breath and wheezing. Pertinent negatives include no fever. She has tried rest and OTC cough suppressant for the symptoms. The treatment provided mild relief.    Problems: There are no problems to display for this patient.   Allergies: No Known Allergies Medications:  Current Outpatient Medications:    benzonatate (TESSALON) 200 MG capsule, Take 1 capsule (200 mg total) by mouth 3 (three) times daily as needed., Disp: 30 capsule, Rfl: 1   albuterol (VENTOLIN HFA) 108 (90 Base) MCG/ACT inhaler, Inhale 2 puffs into the lungs every 6 (six) hours as needed for wheezing or shortness of breath., Disp: 8 g, Rfl: 2   clobetasol cream (TEMOVATE) 9.31 %, Apply 1 application topically 2 (two) times daily., Disp: 45 g, Rfl: 1   fluticasone (FLONASE) 50 MCG/ACT nasal spray, Place 2 sprays into both nostrils  daily., Disp: 16 g, Rfl: 0  Observations/Objective: Patient is well-developed, well-nourished in no acute distress.  Head is normocephalic, atraumatic.  No labored breathing.  Speech is clear and coherent with logical content.  Patient is alert and oriented at baseline.  Constant coughing and nasal congestion  Assessment and Plan: 1. Viral URI - benzonatate (TESSALON) 200  MG capsule; Take 1 capsule (200 mg total) by mouth 3 (three) times daily as needed.  Dispense: 30 capsule; Refill: 1  PT will take COVID test to rule out and call office and let me know.  If negative will send in prednisone - Take meds as prescribed - Use a cool mist humidifier  -Use saline nose sprays frequently -Force fluids -For any cough or congestion  Use plain Mucinex- regular strength or max strength is fine -For fever or aces or pains- take tylenol or ibuprofen. -Throat lozenges if help -Follow up if symptoms worsen or do not improve    Addendum: PT's COVID test was positive. Molnupiravir Prescription sent to pharmacy.  Follow Up Instructions: I discussed the assessment and treatment plan with the patient. The patient was provided an opportunity to ask questions and all were answered. The patient agreed with the plan and demonstrated an understanding of the instructions.  A copy of instructions were sent to the patient via MyChart unless otherwise noted below.     The patient was advised to call back or seek an in-person evaluation if the symptoms worsen or if the condition fails to improve as anticipated.  Time:  I spent 12 minutes with the patient via telehealth technology discussing the above problems/concerns.    Evelina Dun, FNP

## 2022-06-03 NOTE — Telephone Encounter (Signed)
That means it is positive, I have sent in molnupiravir to her pharmacy. She can come to office have have [place] test completed if she would like to or if her work is requiring.

## 2022-06-04 LAB — NOVEL CORONAVIRUS, NAA: SARS-CoV-2, NAA: DETECTED — AB

## 2022-06-05 ENCOUNTER — Telehealth: Payer: 59 | Admitting: Physician Assistant

## 2022-06-05 DIAGNOSIS — R0781 Pleurodynia: Secondary | ICD-10-CM

## 2022-06-05 NOTE — Progress Notes (Signed)
Because of substantial pain starting after coughing this increases concern of rib injury or can be related to infection in the lower lung. As such,  I feel your condition warrants further evaluation and I recommend that you reach out to your primary care provider tomorrow morning for further assessment.    NOTE: There will be NO CHARGE for this eVisit   If you are having a true medical emergency please call 911.      For an urgent face to face visit, [place] has seven urgent care centers for your convenience:     [facility] [facility] [place] Get Driving Directions [phone] [place], [place] [address]    [facility]) Get Driving Directions [phone] [place], [place] [address]  [facility] [place] [facility]) Get Driving Directions [phone] [address] [place],  [place]  [address]  [facility] Get Driving Directions  [phone] [address] [place],  [place] [address]   [facility] Get Driving Directions [phone] [place], [place], [place] [address]   [facility] Get Driving Directions  [phone] [address] [place], [place] [address]   [facility] [place] Get Driving Directions [phone] [address] [place], [place] [address]  Your MyChart E-visit questionnaire answers were reviewed by a board certified advanced clinical practitioner to complete your personal care plan based on your specific symptoms.  Thank you for using e-Visits.

## 2022-06-10 ENCOUNTER — Telehealth: Payer: 59 | Admitting: Emergency Medicine

## 2022-06-10 DIAGNOSIS — U071 COVID-19: Secondary | ICD-10-CM | POA: Diagnosis not present

## 2022-06-10 NOTE — Progress Notes (Signed)
E-Visit  for Positive Covid Test Result  We are sorry you are not feeling well. We are here to help!  You have tested positive for COVID-19, meaning that you were infected with the novel coronavirus and could give the virus to others.  It is vitally important that you stay home so you do not spread it to others.      Please continue isolation at home, for at least 5 days since the start of your symptoms and until you have had 24 hours with no fever (without taking a fever reducer) and with improving of symptoms.  If you have no symptoms but tested positive (or all symptoms resolve after 5 days and you have no fever) you can leave your house but continue to wear a mask around others for an additional 5 days. If you have a fever,continue to stay home until you have had 24 hours of no fever. Most cases improve 5-10 days from onset but we have seen a small number of patients who have gotten worse after the 10 days.  Please be sure to watch for worsening symptoms and remain taking the proper precautions.   Go to the nearest hospital ED for assessment if fever/cough/breathlessness are severe or illness seems like a threat to life.    The following symptoms may appear 2-14 days after exposure: Fever Cough Shortness of breath or difficulty breathing Chills Repeated shaking with chills Muscle pain Headache Sore throat New loss of taste or smell Fatigue Congestion or runny nose Nausea or vomiting Diarrhea    Usually the tessalon perles are most effective for the covid cough, but when that fails, OTC Delsym is my next preference, but to be honest, no cough medicine is very effective.  I would recommend trying the Delsym though.  I would also take Zyrtec or Claritin.  This cough could last another 5-7 days, but beyond the above mentioned therapies, treatment is supportive.   You may also take acetaminophen (Tylenol) as needed for fever.  HOME CARE: Only take medications as instructed by  your medical team. Drink plenty of fluids and get plenty of rest. A steam or ultrasonic humidifier can help if you have congestion.   GET HELP RIGHT AWAY IF YOU HAVE EMERGENCY WARNING SIGNS.  Call 911 or proceed to your closest emergency facility if: You develop worsening high fever. Trouble breathing Bluish lips or face Persistent pain or pressure in the chest New confusion Inability to wake or stay awake You cough up blood. Your symptoms become more severe Inability to hold down food or fluids  This list is not all possible symptoms. Contact your medical provider for any symptoms that are severe or concerning to you.    Your e-visit answers were reviewed by a board certified advanced clinical practitioner to complete your personal care plan.  Depending on the condition, your plan could have included both over the counter or prescription medications.  If there is a problem please reply once you have received a response from your provider.  Your safety is important to [place].  If you have drug allergies check your prescription carefully.    You can use MyChart to ask questions about today's visit, request a non-urgent call back, or ask for a work or school excuse for 24 hours related to this e-Visit. If it has been greater than 24 hours you will need to follow up with your provider, or enter a new e-Visit to address those concerns. You will get an  e-mail in the next two days asking about your experience.  I hope that your e-visit has been valuable and will speed your recovery. Thank you for using e-visits.   Approximately 5 minutes was used in reviewing the patient's chart, questionnaire, prescribing medications, and documentation.

## 2022-07-06 ENCOUNTER — Other Ambulatory Visit (HOSPITAL_COMMUNITY): Payer: Self-pay

## 2022-07-07 ENCOUNTER — Other Ambulatory Visit (HOSPITAL_COMMUNITY): Payer: Self-pay

## 2022-07-07 MED ORDER — CLOBETASOL PROPIONATE 0.05 % EX CREA
TOPICAL_CREAM | Freq: Two times a day (BID) | CUTANEOUS | 1 refills | Status: DC
  Filled 2022-07-07 – 2022-08-15 (×2): qty 45, 30d supply, fill #0
  Filled 2022-10-31: qty 45, 30d supply, fill #1

## 2022-07-27 IMAGING — DX DG CHEST 2V
3 series · 3 of 3 positions shown · non-contrast
Comparison: None.

CLINICAL DATA: Wheezing

EXAM:
CHEST - 2 VIEW

[chest pa (1 of 2)]
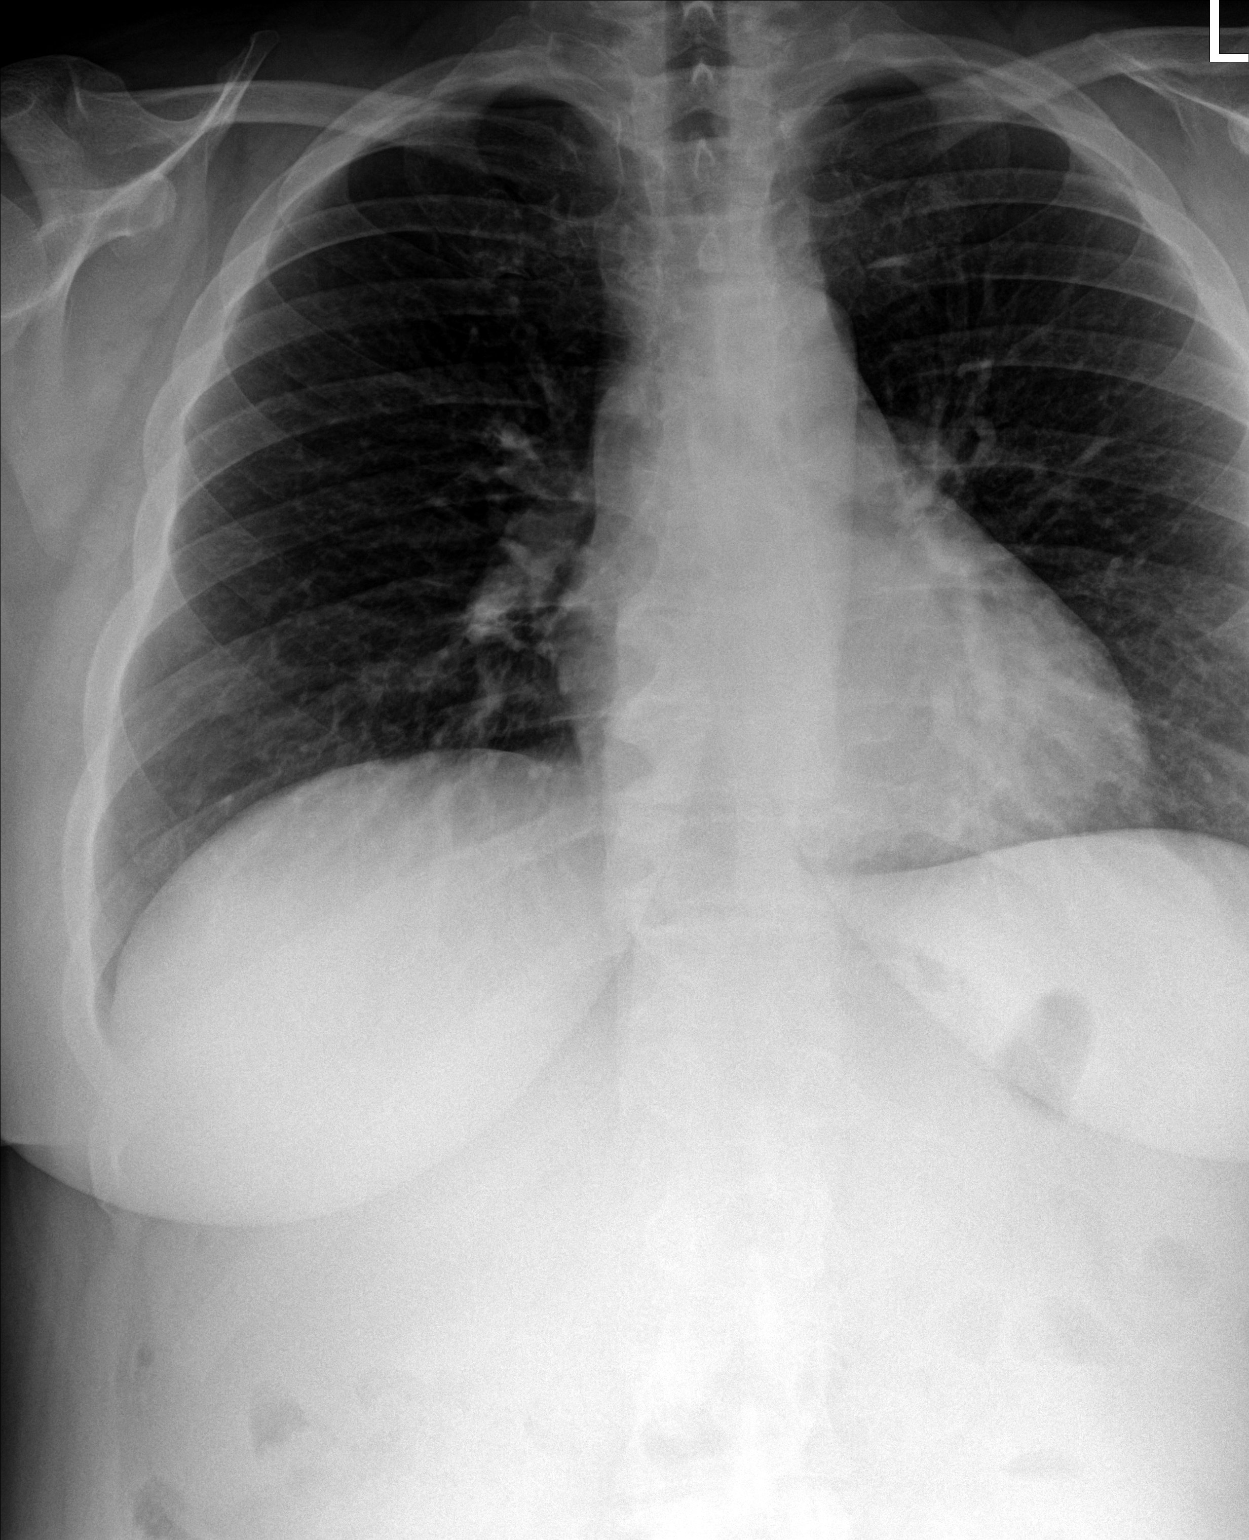

[chest lat]
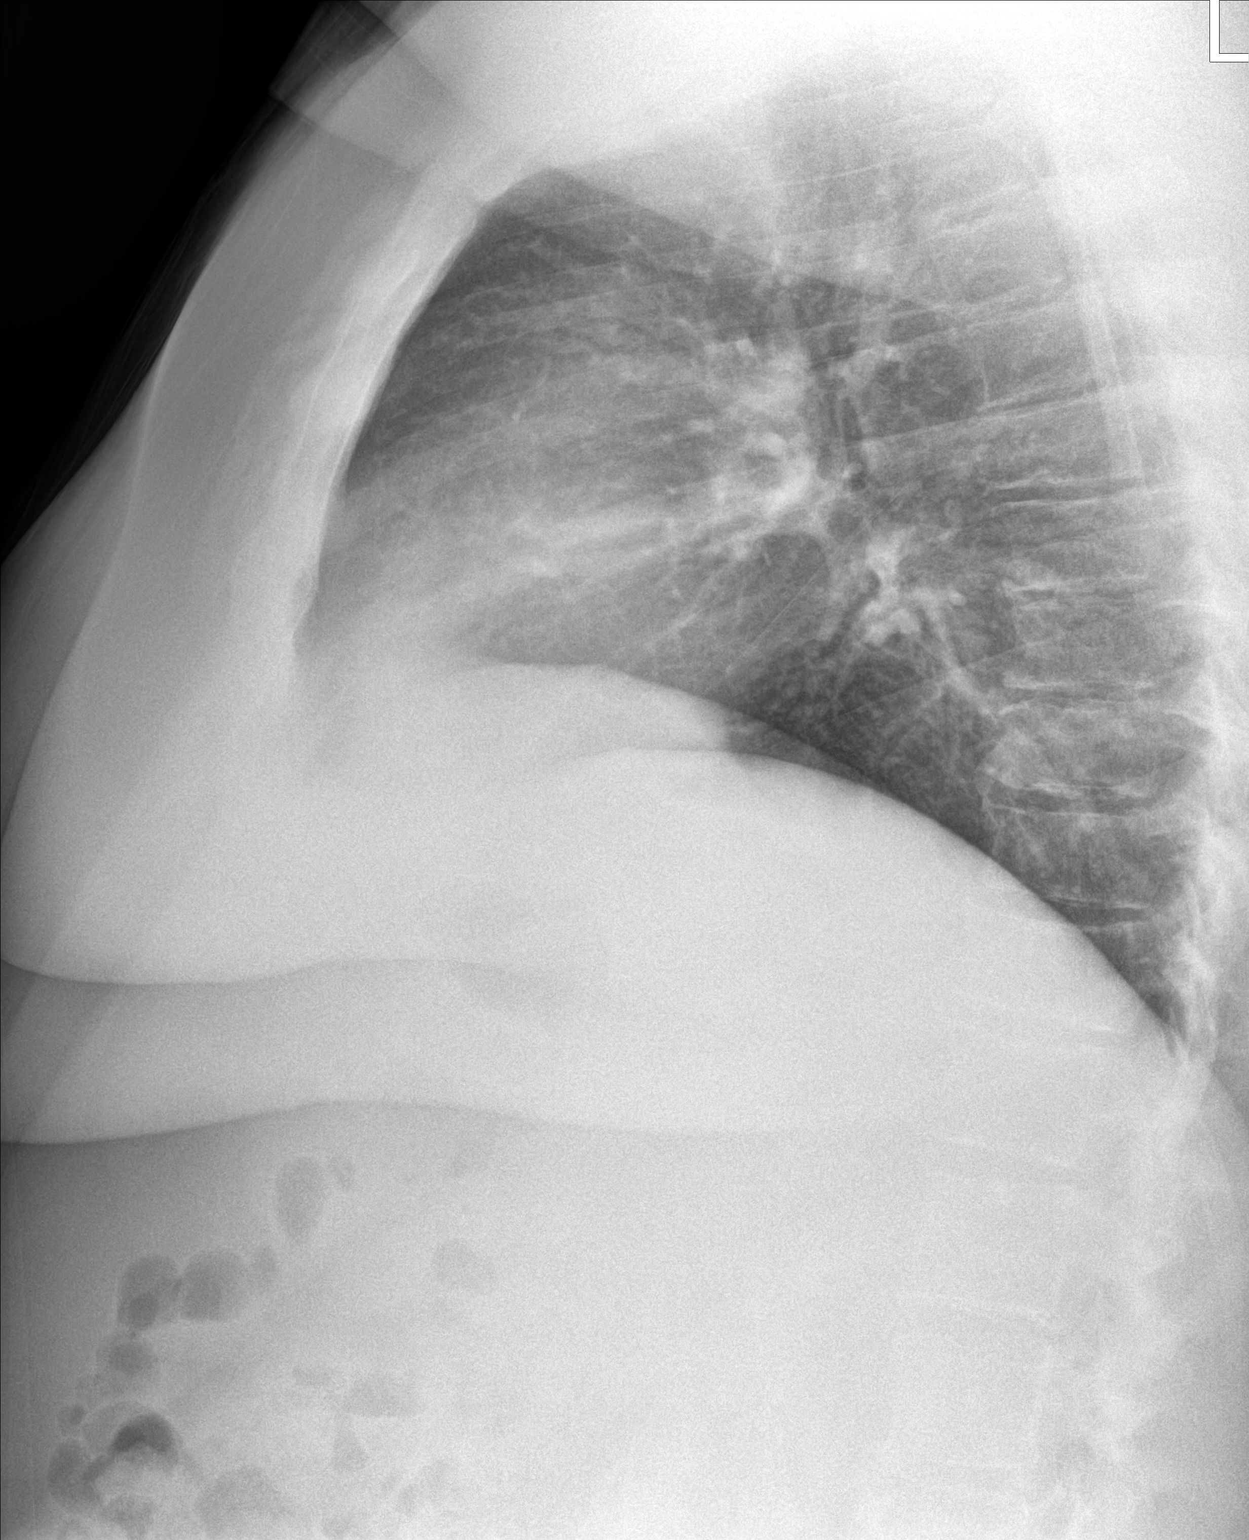

[chest pa (2 of 2)]
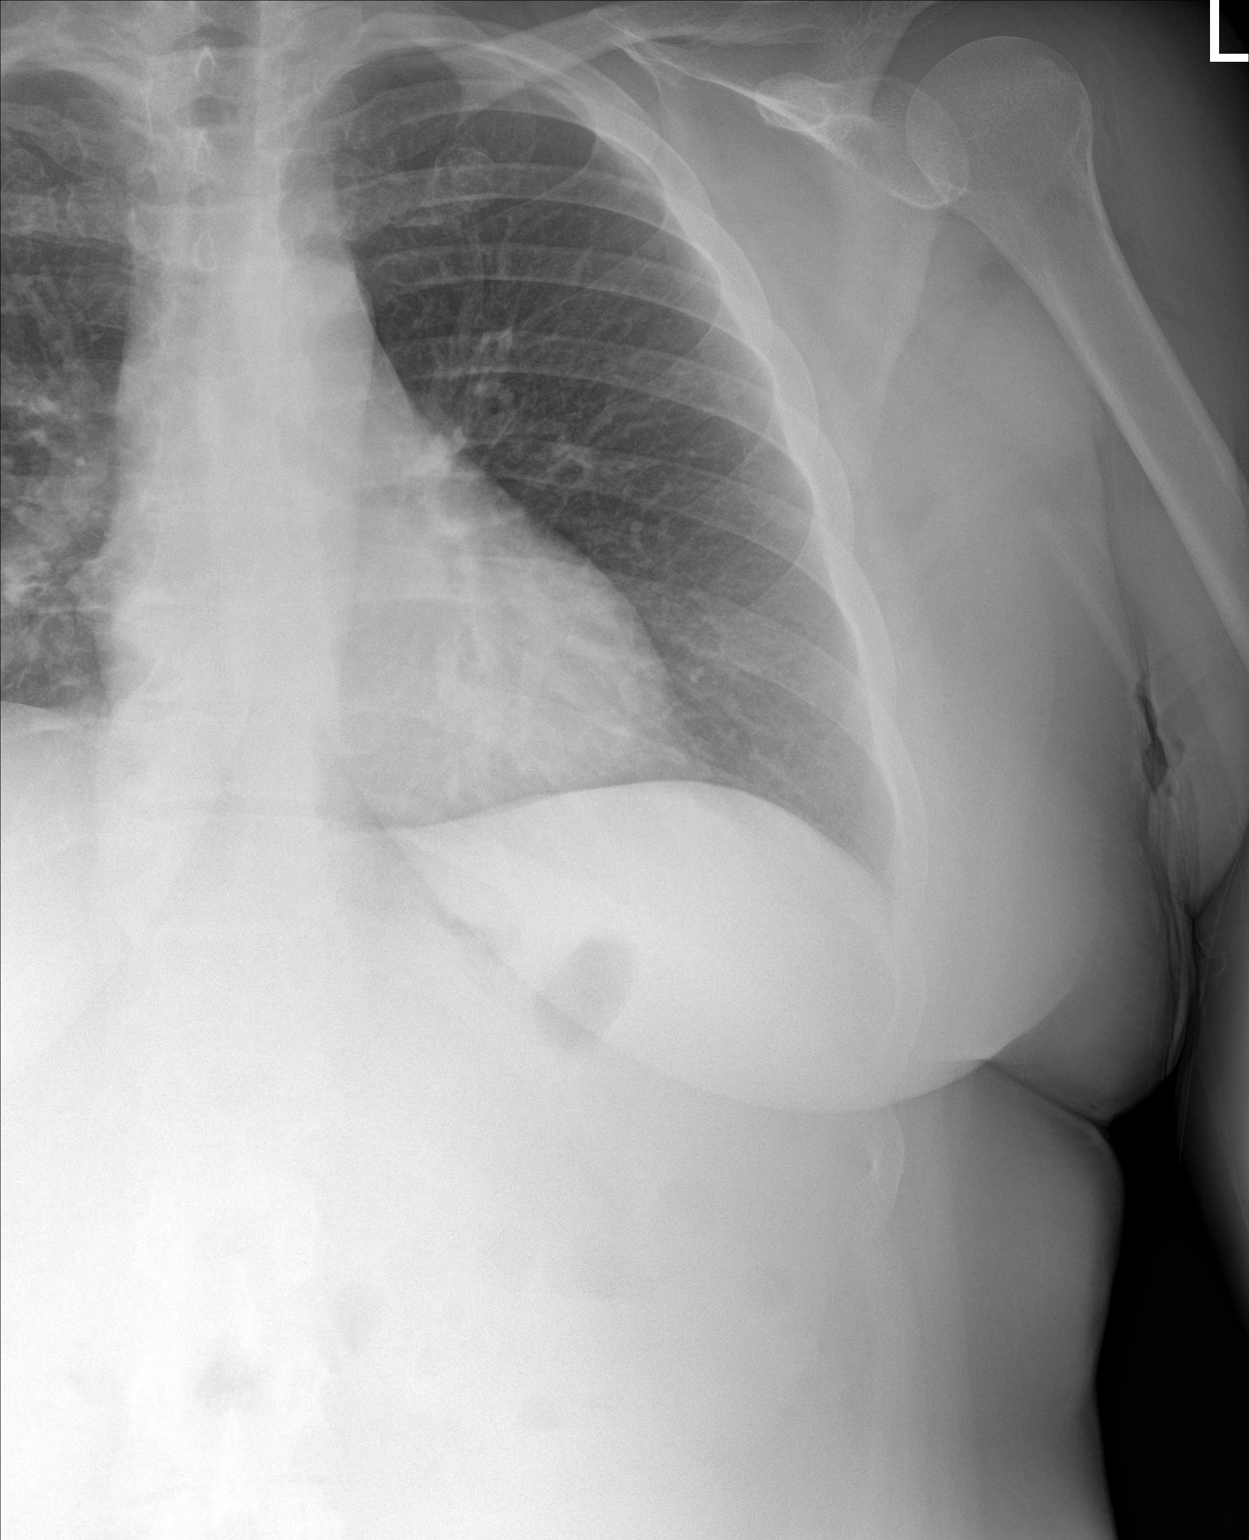

[3 of 3 positions shown; findings below may reference images not displayed]

FINDINGS: The heart size and mediastinal contours are within normal limits.
Both lungs are clear. The visualized skeletal structures are
unremarkable.
IMPRESSION: No active cardiopulmonary disease.

## 2022-08-15 ENCOUNTER — Telehealth: Payer: 59 | Admitting: Family Medicine

## 2022-08-15 ENCOUNTER — Other Ambulatory Visit (HOSPITAL_COMMUNITY): Payer: Self-pay

## 2022-08-15 DIAGNOSIS — M545 Low back pain, unspecified: Secondary | ICD-10-CM

## 2022-08-15 MED ORDER — NAPROXEN 500 MG PO TABS
500.0000 mg | ORAL_TABLET | Freq: Two times a day (BID) | ORAL | 0 refills | Status: DC
  Filled 2022-08-15: qty 30, 15d supply, fill #0

## 2022-08-15 MED ORDER — CYCLOBENZAPRINE HCL 10 MG PO TABS
10.0000 mg | ORAL_TABLET | Freq: Three times a day (TID) | ORAL | 0 refills | Status: DC | PRN
  Filled 2022-08-15: qty 30, 10d supply, fill #0

## 2022-08-15 NOTE — Progress Notes (Signed)

## 2022-09-17 ENCOUNTER — Telehealth: Payer: 59 | Admitting: Physician Assistant

## 2022-09-17 DIAGNOSIS — J069 Acute upper respiratory infection, unspecified: Secondary | ICD-10-CM | POA: Diagnosis not present

## 2022-09-17 MED ORDER — BENZONATATE 200 MG PO CAPS: 200.0000 mg | ORAL_CAPSULE | Freq: Three times a day (TID) | ORAL | 1 refills | Status: DC | PRN

## 2022-09-17 MED ORDER — PREDNISONE 20 MG PO TABS: 40.0000 mg | ORAL_TABLET | Freq: Every day | ORAL | 0 refills | Status: DC

## 2022-09-17 MED ORDER — AZELASTINE HCL 0.1 % NA SOLN: 1.0000 | Freq: Two times a day (BID) | NASAL | 0 refills | Status: DC

## 2022-09-17 NOTE — Progress Notes (Signed)
E-Visit for Upper Respiratory Infection   We are sorry you are not feeling well.  Here is how we plan to help!  Based on what you have shared with me, it looks like you may have a viral upper respiratory infection.  Upper respiratory infections are caused by a large number of viruses; however, rhinovirus is the most common cause.   Symptoms vary from person to person, with common symptoms including sore throat, cough, fatigue or lack of energy and feeling of general discomfort.  A low-grade fever of up to 100.4 may present, but is often uncommon.  Symptoms vary however, and are closely related to a person's age or underlying illnesses.  The most common symptoms associated with an upper respiratory infection are nasal discharge or congestion, cough, sneezing, headache and pressure in the ears and face.  These symptoms usually persist for about 3 to 10 days, but can last up to 2 weeks.  It is important to know that upper respiratory infections do not cause serious illness or complications in most cases.    Upper respiratory infections can be transmitted from person to person, with the most common method of transmission being a person's hands.  The virus is able to live on the skin and can infect other persons for up to 2 hours after direct contact.  Also, these can be transmitted when someone coughs or sneezes; thus, it is important to cover the mouth to reduce this risk.  To keep the spread of the illness at [place], good hand hygiene is very important.  This is an infection that is most likely caused by a virus. There are no specific treatments other than to help you with the symptoms until the infection runs its course.  We are sorry you are not feeling well.  Here is how we plan to help!   For nasal congestion, you may use an oral decongestants such as Mucinex D or if you have glaucoma or high blood pressure use plain Mucinex.  Saline nasal spray or nasal drops can help and can safely be used as often as  needed for congestion.  For your congestion, I have prescribed Azelastine nasal spray two sprays in each nostril twice a day  If you do not have a history of heart disease, hypertension, diabetes or thyroid disease, prostate/bladder issues or glaucoma, you may also use Sudafed to treat nasal congestion.  It is highly recommended that you consult with a pharmacist or your primary care physician to ensure this medication is safe for you to take.     If you have a cough, you may use cough suppressants such as Delsym and Robitussin.  If you have glaucoma or high blood pressure, you can also use Coricidin HBP.   For cough I have prescribed for you A prescription cough medication called Tessalon Perles 100 mg. You may take 1-2 capsules every 8 hours as needed for cough and Prednisone '20mg'$  Take '40mg'$  (2 tablets) daily for 5 days.  If you have a sore or scratchy throat, use a saltwater gargle-  to  teaspoon of salt dissolved in a 4-ounce to 8-ounce glass of warm water.  Gargle the solution for approximately 15-30 seconds and then spit.  It is important not to swallow the solution.  You can also use throat lozenges/cough drops and Chloraseptic spray to help with throat pain or discomfort.  Warm or cold liquids can also be helpful in relieving throat pain.  For headache, pain or general discomfort, you can  use Ibuprofen or Tylenol as directed.   Some authorities believe that zinc sprays or the use of Echinacea may shorten the course of your symptoms.   HOME CARE Only take medications as instructed by your medical team. Be sure to drink plenty of fluids. Water is fine as well as fruit juices, sodas and electrolyte beverages. You may want to stay away from caffeine or alcohol. If you are nauseated, try taking small sips of liquids. How do you know if you are getting enough fluid? Your urine should be a pale yellow or almost colorless. Get rest. Taking a steamy shower or using a humidifier may help nasal  congestion and ease sore throat pain. You can place a towel over your head and breathe in the steam from hot water coming from a faucet. Using a saline nasal spray works much the same way. Cough drops, hard candies and sore throat lozenges may ease your cough. Avoid close contacts especially the very young and the elderly Cover your mouth if you cough or sneeze Always remember to wash your hands.   GET HELP RIGHT AWAY IF: You develop worsening fever. If your symptoms do not improve within 10 days You develop yellow or green discharge from your nose over 3 days. You have coughing fits You develop a severe head ache or visual changes. You develop shortness of breath, difficulty breathing or start having chest pain Your symptoms persist after you have completed your treatment plan  MAKE SURE YOU  Understand these instructions. Will watch your condition. Will get help right away if you are not doing well or get worse.  Thank you for choosing an e-visit.  Your e-visit answers were reviewed by a board certified advanced clinical practitioner to complete your personal care plan. Depending upon the condition, your plan could have included both over the counter or prescription medications.  Please review your pharmacy choice. Make sure the pharmacy is open so you can pick up prescription now. If there is a problem, you may contact your provider through [employer] and have the prescription routed to another pharmacy.  Your safety is important to [place]. If you have drug allergies check your prescription carefully.   For the next 24 hours you can use MyChart to ask questions about today's visit, request a non-urgent call back, or ask for a work or school excuse. You will get an email in the next two days asking about your experience. I hope that your e-visit has been valuable and will speed your recovery.   I have spent 5 minutes in review of e-visit questionnaire, review and updating patient  chart, medical decision making and response to patient.   Mar Daring, PA-C

## 2022-10-14 ENCOUNTER — Telehealth: Payer: Commercial Managed Care - PPO | Admitting: Physician Assistant

## 2022-10-14 DIAGNOSIS — M62838 Other muscle spasm: Secondary | ICD-10-CM | POA: Diagnosis not present

## 2022-10-14 MED ORDER — CYCLOBENZAPRINE HCL 10 MG PO TABS: 10.0000 mg | ORAL_TABLET | Freq: Three times a day (TID) | ORAL | 0 refills | Status: DC | PRN

## 2022-10-14 MED ORDER — NAPROXEN 500 MG PO TABS: 500.0000 mg | ORAL_TABLET | Freq: Two times a day (BID) | ORAL | 0 refills | Status: DC

## 2022-10-14 NOTE — Progress Notes (Signed)

## 2022-10-20 ENCOUNTER — Emergency Department (HOSPITAL_BASED_OUTPATIENT_CLINIC_OR_DEPARTMENT_OTHER): Payer: Commercial Managed Care - PPO

## 2022-10-20 ENCOUNTER — Ambulatory Visit
Admission: EM | Admit: 2022-10-20 | Discharge: 2022-10-20 | Disposition: A | Discharge status: Home / Self Care | Payer: Commercial Managed Care - PPO

## 2022-10-20 ENCOUNTER — Observation Stay (HOSPITAL_BASED_OUTPATIENT_CLINIC_OR_DEPARTMENT_OTHER)
Admission: EM | Admit: 2022-10-20 | Discharge: 2022-10-21 | Disposition: A | Discharge status: Home / Self Care | Payer: Commercial Managed Care - PPO | Attending: Internal Medicine | Admitting: Internal Medicine

## 2022-10-20 ENCOUNTER — Encounter (HOSPITAL_COMMUNITY): Payer: Self-pay

## 2022-10-20 ENCOUNTER — Encounter (HOSPITAL_BASED_OUTPATIENT_CLINIC_OR_DEPARTMENT_OTHER): Payer: Self-pay | Admitting: Emergency Medicine

## 2022-10-20 ENCOUNTER — Other Ambulatory Visit: Payer: Self-pay

## 2022-10-20 DIAGNOSIS — K828 Other specified diseases of gallbladder: Secondary | ICD-10-CM | POA: Diagnosis not present

## 2022-10-20 DIAGNOSIS — R198 Other specified symptoms and signs involving the digestive system and abdomen: Secondary | ICD-10-CM | POA: Diagnosis not present

## 2022-10-20 DIAGNOSIS — E876 Hypokalemia: Secondary | ICD-10-CM | POA: Diagnosis present

## 2022-10-20 DIAGNOSIS — R000 Tachycardia, unspecified: Secondary | ICD-10-CM | POA: Diagnosis not present

## 2022-10-20 DIAGNOSIS — K810 Acute cholecystitis: Secondary | ICD-10-CM | POA: Diagnosis not present

## 2022-10-20 DIAGNOSIS — D649 Anemia, unspecified: Secondary | ICD-10-CM | POA: Diagnosis present

## 2022-10-20 DIAGNOSIS — N200 Calculus of kidney: Secondary | ICD-10-CM | POA: Diagnosis not present

## 2022-10-20 DIAGNOSIS — R1084 Generalized abdominal pain: Secondary | ICD-10-CM | POA: Diagnosis not present

## 2022-10-20 DIAGNOSIS — E66813 Obesity, class 3: Secondary | ICD-10-CM | POA: Diagnosis present

## 2022-10-20 DIAGNOSIS — R1011 Right upper quadrant pain: Secondary | ICD-10-CM | POA: Diagnosis not present

## 2022-10-20 DIAGNOSIS — K824 Cholesterolosis of gallbladder: Secondary | ICD-10-CM | POA: Diagnosis not present

## 2022-10-20 DIAGNOSIS — Z6841 Body Mass Index (BMI) 40.0 and over, adult: Secondary | ICD-10-CM | POA: Insufficient documentation

## 2022-10-20 DIAGNOSIS — R112 Nausea with vomiting, unspecified: Secondary | ICD-10-CM | POA: Diagnosis not present

## 2022-10-20 DIAGNOSIS — D500 Iron deficiency anemia secondary to blood loss (chronic): Secondary | ICD-10-CM | POA: Insufficient documentation

## 2022-10-20 DIAGNOSIS — J45909 Unspecified asthma, uncomplicated: Secondary | ICD-10-CM | POA: Insufficient documentation

## 2022-10-20 DIAGNOSIS — K819 Cholecystitis, unspecified: Secondary | ICD-10-CM | POA: Diagnosis not present

## 2022-10-20 DIAGNOSIS — K760 Fatty (change of) liver, not elsewhere classified: Secondary | ICD-10-CM | POA: Diagnosis not present

## 2022-10-20 DIAGNOSIS — R1013 Epigastric pain: Secondary | ICD-10-CM | POA: Diagnosis not present

## 2022-10-20 HISTORY — DX: Calculus of kidney: N20.0

## 2022-10-20 LAB — LIPASE, BLOOD: Lipase: 27 U/L (ref 11–51)

## 2022-10-20 LAB — CBC
HCT: 33.4 % — ABNORMAL LOW (ref 36.0–46.0)
HCT: 37 % (ref 36.0–46.0)
Hemoglobin: 10.8 g/dL — ABNORMAL LOW (ref 12.0–15.0)
Hemoglobin: 9.5 g/dL — ABNORMAL LOW (ref 12.0–15.0)
MCH: 20.5 pg — ABNORMAL LOW (ref 26.0–34.0)
MCH: 20.7 pg — ABNORMAL LOW (ref 26.0–34.0)
MCHC: 28.4 g/dL — ABNORMAL LOW (ref 30.0–36.0)
MCHC: 29.2 g/dL — ABNORMAL LOW (ref 30.0–36.0)
MCV: 70.2 fL — ABNORMAL LOW (ref 80.0–100.0)
MCV: 72.8 fL — ABNORMAL LOW (ref 80.0–100.0)
Platelets: 247 10*3/uL (ref 150–400)
Platelets: 330 10*3/uL (ref 150–400)
RBC: 4.59 MIL/uL (ref 3.87–5.11)
RBC: 5.27 MIL/uL — ABNORMAL HIGH (ref 3.87–5.11)
RDW: 17.7 % — ABNORMAL HIGH (ref 11.5–15.5)
RDW: 18.6 % — ABNORMAL HIGH (ref 11.5–15.5)
WBC: 10.1 10*3/uL (ref 4.0–10.5)
WBC: 13.4 10*3/uL — ABNORMAL HIGH (ref 4.0–10.5)
nRBC: 0 % (ref 0.0–0.2)
nRBC: 0 % (ref 0.0–0.2)

## 2022-10-20 LAB — COMPREHENSIVE METABOLIC PANEL
ALT: 31 U/L (ref 0–44)
AST: 47 U/L — ABNORMAL HIGH (ref 15–41)
Albumin: 4 g/dL (ref 3.5–5.0)
Alkaline Phosphatase: 97 U/L (ref 38–126)
Anion gap: 10 (ref 5–15)
BUN: 7 mg/dL (ref 6–20)
CO2: 24 mmol/L (ref 22–32)
Calcium: 8.5 mg/dL — ABNORMAL LOW (ref 8.9–10.3)
Chloride: 102 mmol/L (ref 98–111)
Creatinine, Ser: 0.69 mg/dL (ref 0.44–1.00)
GFR, Estimated: >60 mL/min (ref >60)
Glucose, Bld: 106 mg/dL — ABNORMAL HIGH (ref 70–99)
Potassium: 3.4 mmol/L — ABNORMAL LOW (ref 3.5–5.1)
Sodium: 136 mmol/L (ref 135–145)
Total Bilirubin: 2.1 mg/dL — ABNORMAL HIGH (ref 0.3–1.2)
Total Protein: 8.2 g/dL — ABNORMAL HIGH (ref 6.5–8.1)

## 2022-10-20 MED ORDER — ENOXAPARIN SODIUM 40 MG/0.4ML IJ SOSY
40.0000 mg | PREFILLED_SYRINGE | Freq: Every day | INTRAMUSCULAR | Status: DC
  Administered 2022-10-21: 40 mg via SUBCUTANEOUS

## 2022-10-20 MED ORDER — IOHEXOL 300 MG/ML  SOLN
125.0000 mL | Freq: Once | INTRAMUSCULAR | Status: AC | PRN
  Administered 2022-10-20: 125 mL via INTRAVENOUS

## 2022-10-20 MED ORDER — ONDANSETRON 4 MG PO TBDP
4.0000 mg | ORAL_TABLET | ORAL | Status: AC | PRN
  Administered 2022-10-20: 4 mg via ORAL
  Filled 2022-10-20: qty 1

## 2022-10-20 MED ORDER — SODIUM CHLORIDE 0.9 % IV SOLN
1.0000 g | Freq: Once | INTRAVENOUS | Status: AC
  Administered 2022-10-20: 1 g via INTRAVENOUS
  Filled 2022-10-20: qty 10

## 2022-10-20 MED ORDER — DEXTROSE IN LACTATED RINGERS 5 % IV SOLN: INTRAVENOUS | Status: DC

## 2022-10-20 MED ORDER — ONDANSETRON HCL 4 MG/2ML IJ SOLN: 4.0000 mg | Freq: Four times a day (QID) | INTRAMUSCULAR | Status: DC | PRN

## 2022-10-20 MED ORDER — METRONIDAZOLE 500 MG/100ML IV SOLN
500.0000 mg | Freq: Two times a day (BID) | INTRAVENOUS | Status: DC
  Administered 2022-10-20: 500 mg via INTRAVENOUS
  Filled 2022-10-20: qty 100

## 2022-10-20 MED ORDER — ONDANSETRON HCL 4 MG PO TABS: 4.0000 mg | ORAL_TABLET | Freq: Four times a day (QID) | ORAL | Status: DC | PRN

## 2022-10-20 MED ORDER — LACTATED RINGERS IV BOLUS
1000.0000 mL | Freq: Once | INTRAVENOUS | Status: AC
  Administered 2022-10-20: 1000 mL via INTRAVENOUS

## 2022-10-20 MED ORDER — SODIUM CHLORIDE 0.9 % IV SOLN: 2.0000 g | Freq: Every day | INTRAVENOUS | Status: DC

## 2022-10-20 MED ORDER — KETOROLAC TROMETHAMINE 15 MG/ML IJ SOLN
15.0000 mg | Freq: Once | INTRAMUSCULAR | Status: AC
  Administered 2022-10-20: 15 mg via INTRAMUSCULAR
  Filled 2022-10-20: qty 1

## 2022-10-20 MED ORDER — MORPHINE SULFATE (PF) 2 MG/ML IV SOLN: 2.0000 mg | INTRAVENOUS | Status: DC | PRN

## 2022-10-20 NOTE — Progress Notes (Addendum)
Patient arrived to [facility] via personal vehicle. Page sent to [facility] admissions to notifiy of arrival.

## 2022-10-20 NOTE — ED Provider Notes (Signed)
[facility]RGENT [facility]ARE [facility]  Note:  This document was prepared using Systems analyst and may include unintentional dictation errors.  MRN: [id number] DOB: June 02, 1971  Subjective:   Linda Wilson is a 52 y.o. female presenting for 2-day history of acute onset severe 10 out of 10 abdominal pain with nausea, decreased appetite.  No fever, constipation, diarrhea, vomiting, bloody stools, dysuria, hematuria, urinary frequency or urinary urgency.  No history of appendicitis, appendectomy, cholecystitis, gallbladder disease, diverticulitis or GI disorders.  Last bowel movement was last night it made no difference to her symptoms.  No current facility-administered medications for this encounter.  Current Outpatient Medications:    albuterol (VENTOLIN HFA) 108 (90 Base) MCG/ACT inhaler, Inhale 2 puffs into the lungs every 6 (six) hours as needed for wheezing or shortness of breath., Disp: 8 g, Rfl: 2   azelastine (ASTELIN) 0.1 % nasal spray, Place 1 spray into both nostrils 2 (two) times daily. Use in each nostril as directed, Disp: 30 mL, Rfl: 0   benzonatate (TESSALON) 200 MG capsule, Take 1 capsule (200 mg total) by mouth 3 (three) times daily as needed., Disp: 30 capsule, Rfl: 1   clobetasol cream (TEMOVATE) 2.99 %, Apply 1 application topically 2 (two) times daily., Disp: 45 g, Rfl: 1   cyclobenzaprine (FLEXERIL) 10 MG tablet, Take 1 tablet (10 mg total) by mouth 3 (three) times daily as needed for muscle spasms., Disp: 30 tablet, Rfl: 0   fluticasone (FLONASE) 50 MCG/ACT nasal spray, Place 2 sprays into both nostrils daily., Disp: 16 g, Rfl: 0   naproxen (NAPROSYN) 500 MG tablet, Take 1 tablet (500 mg total) by mouth 2 (two) times daily with a meal., Disp: 30 tablet, Rfl: 0   No Known Allergies  History reviewed. No pertinent past medical history.   Past Surgical History:  Procedure Laterality Date   CESAREAN SECTION     TUBAL LIGATION      Family History   Problem Relation Age of Onset   Healthy Mother    Heart disease Father        MI   Diabetes Father    Stroke Father     Social History   Tobacco Use   Smoking status: Never   Smokeless tobacco: Never  Vaping Use   Vaping Use: Never used  Substance Use Topics   Alcohol use: No   Drug use: No    ROS   Objective:   Vitals: BP (!) 154/89 (BP Location: Right Arm)   Pulse (!) 102   Temp 98.8 F (37.1 C) (Oral)   Resp 18   LMP 11/09/2014   SpO2 94%   Physical Exam Constitutional:      General: She is not in acute distress.    Appearance: Normal appearance. She is well-developed. She is not ill-appearing, toxic-appearing or diaphoretic.  HENT:     Head: Normocephalic and atraumatic.     Nose: Nose normal.     Mouth/Throat:     Mouth: Mucous membranes are moist.     Pharynx: Oropharynx is clear.  Eyes:     General: No scleral icterus.       Right eye: No discharge.        Left eye: No discharge.     Extraocular Movements: Extraocular movements intact.     Conjunctiva/sclera: Conjunctivae normal.  Cardiovascular:     Rate and Rhythm: Normal rate.  Pulmonary:     Effort: Pulmonary effort is normal.  Abdominal:  General: Bowel sounds are normal. There is distension.     Palpations: Abdomen is soft. There is no mass.     Tenderness: There is generalized abdominal tenderness and tenderness in the right upper quadrant, right lower quadrant, epigastric area, periumbilical area and left upper quadrant. There is guarding. There is no right CVA tenderness, left CVA tenderness or rebound.  Skin:    General: Skin is warm and dry.  Neurological:     General: No focal deficit present.     Mental Status: She is alert and oriented to person, place, and time.  Psychiatric:        Mood and Affect: Mood normal.        Behavior: Behavior normal.        Thought Content: Thought content normal.        Judgment: Judgment normal.     Assessment and Plan :   PDMP not  reviewed this encounter.  1. Generalized abdominal pain     Deferred and urinalysis due to a lack of urine symptoms.  Patient is in need of higher level of care than we can provide in the urgent care setting.  Primary concern is for acute abdomen includes diverticulitis, cholecystitis, peritonitis, appendicitis.  Patient declined pain medicine in our clinic.  Will present to the emergency room by personal vehicle now.   Jaynee Eagles, [place] 10/20/22 1432

## 2022-10-20 NOTE — ED Notes (Signed)
Assumed care of patient.

## 2022-10-20 NOTE — Progress Notes (Addendum)
Hospitalist Transfer Note:  Transferring facility: [facility] Requesting provider:  Dallie Piles, PA (EDP at [facility] Reason for transfer: admission for further evaluation and management of acute cholecystitis.   52 year old female who presented to [place] ED complaining of 2 days of progressive epigastric pain associated with intermittent nausea in the absence of any vomiting.  Not associate with any fever, rash, or jaundice.   Vital signs in the ED were notable for the following: Afebrile, normotensive.   Labs were notable for the following: CMP associated with mildly elevated total bilirubin of 2.1, AST 47.  Otherwise no significant elevation liver enzymes.  CBC notable for mildly elevated white blood cell count of 13,400.  Imaging notable for CT abdomen/pelvis, which was reportedly unremarkable.  Ensuing right upper quadrant ultrasound was reportedly suggestive of acute cholecystitis evidence of common bile duct dilation or choledocholithiasis.  EDP at [facility] d/w on-call general surgeon, Dr. Johney Maine, Who will formally consult and will see the patient in the morning, at which time he is anticipating cholecystectomy.  In the interval, Dr. Johney Maine has requested that the patient receive Rocephin, which has subsequently been initiated.  Subsequently, I accepted this patient for transfer for inpatient admission to a med-surg bed at [facility] for further work-up and management of the above.     Check [email or web address] for on-call coverage.   Nursing staff, Please call [place] number on Amion as soon as patient's arrival, so appropriate admitting provider can evaluate the pt.     Babs Bertin, DO Hospitalist

## 2022-10-20 NOTE — ED Triage Notes (Signed)
Pt having upper abdominal pain x 2 days.  Some nausea.  No vomiting, no diarrhea.  No known fever.  No vaginal discharge.  Occasional sob.

## 2022-10-20 NOTE — H&P (Signed)
History and Physical    Patient: Linda Wilson JME:[id number] DOB: [birth year] DOA: 10/20/2022 DOS: the patient was seen and examined on 10/20/2022 PCP: Sharion Balloon, FNP  Patient coming from: Home  Chief Complaint:  Chief Complaint  Patient presents with   Abdominal Pain   HPI: Linda Wilson is a 52 y.o. female with medical history significant of nephrolithiasis, asthma, who presented to [place] with abdominal pain nausea vomiting.  Symptoms have been going on for 2 days.  Is 10 out of 10 in nature.  Decreased appetite.  No fever, no hematemesis no melena no diarrhea or constipation.  Patient has not had prior abdominal surgeries.  She was seen and evaluated.  Initial CT abdomen pelvis showed distended gallbladder.  Subsequent ultrasound of the right upper quadrant confirmed acute cholecystitis.  Surgery consulted and recommends admission to medical service.  Patient is hemodynamically stable.   Review of Systems: As mentioned in the history of present illness. All other systems reviewed and are negative. Past Medical History:  Diagnosis Date   Nephrolithiasis 10/20/2022   Past Surgical History:  Procedure Laterality Date   CESAREAN SECTION     INCISION AND DRAINAGE ABSCESS  02/15/2007   pubic abscess - I&D Dr Marlou Starks   TUBAL LIGATION  09/16/2005   Cheri Fowler, MD   Social History:  reports that she has never smoked. She has never used smokeless tobacco. She reports that she does not drink alcohol and does not use drugs.  No Known Allergies  Family History  Problem Relation Age of Onset   Healthy Mother    Heart disease Father        MI   Diabetes Father    Stroke Father     Prior to Admission medications   Medication Sig Start Date End Date Taking? Authorizing Provider  albuterol (VENTOLIN HFA) 108 (90 Base) MCG/ACT inhaler Inhale 2 puffs into the lungs every 6 (six) hours as needed for wheezing or shortness of breath. 12/22/21   Ivy Lynn, NP   azelastine (ASTELIN) 0.1 % nasal spray Place 1 spray into both nostrils 2 (two) times daily. Use in each nostril as directed 09/17/22   Mar Daring, PA-C  benzonatate (TESSALON) 200 MG capsule Take 1 capsule (200 mg total) by mouth 3 (three) times daily as needed. 09/17/22   Mar Daring, PA-C  clobetasol cream (TEMOVATE) 2.29 % Apply 1 application topically 2 (two) times daily. 07/07/22     cyclobenzaprine (FLEXERIL) 10 MG tablet Take 1 tablet (10 mg total) by mouth 3 (three) times daily as needed for muscle spasms. 10/14/22   Mar Daring, PA-C  fluticasone (FLONASE) 50 MCG/ACT nasal spray Place 2 sprays into both nostrils daily. 12/14/21   Brunetta Jeans, PA-C  naproxen (NAPROSYN) 500 MG tablet Take 1 tablet (500 mg total) by mouth 2 (two) times daily with a meal. 10/14/22   Mar Daring, PA-C    Physical Exam: Vitals:   10/20/22 2015 10/20/22 2140 10/20/22 2141 10/20/22 2232  BP: (!) 122/53 (!) 133/57  (!) 151/140  Pulse: 91 91  95  Resp: '15 15  15  '$ Temp:   98.4 F (36.9 C)   TempSrc:   Oral   SpO2: 98% 95%  95%  Weight:      Height:       Constitutional: Acutely ill looking no distress, calm, comfortable Eyes: PERRL, lids and conjunctivae normal ENMT: Mucous membranes are moist. Posterior pharynx clear  of any exudate or lesions.Normal dentition.  Neck: normal, supple, no masses, no thyromegaly Respiratory: clear to auscultation bilaterally, no wheezing, no crackles. Normal respiratory effort. No accessory muscle use.  Cardiovascular: Regular rate and rhythm, no murmurs / rubs / gallops. No extremity edema. 2+ pedal pulses. No carotid bruits.  Abdomen: Right upper quadrant abdominal tenderness no masses palpated. No hepatosplenomegaly. Bowel sounds positive.  Musculoskeletal: Good range of motion, no joint swelling or tenderness, Skin: no rashes, lesions, ulcers. No induration Neurologic: CN 2-12 grossly intact. Sensation intact, DTR normal.  Strength 5/5 in all 4.  Psychiatric: Normal judgment and insight. Alert and oriented x 3. Normal mood  Data Reviewed:  Temperature 98.8, blood pressure 122/53, pulse 102 respiratory 50 oxygen sat 94 cm.  White count 13.4 hemoglobin 10.8 and platelet 330.  Sodium 136, potassium 3.4 chloride 102 CO2 24 BUN 7 creatinine 0.69 calcium 8.5.  Assessment and Plan:  #1 acute cholecystitis: Patient will be admitted.  Will be placed NPO.  IV Rocephin and Flagyl.  Surgery consulted.  Surgery to evaluate in the morning and will decide on surgery.  #2 normocytic anemia: H&H is stable.  Continue to monitor  #3 hypokalemia: Continue to relate  #4 history of nephrolithiasis: Stable.  #5 morbid obesity: Continue dietary counseling    Advance Care Planning:   Code Status: Not on file full code  Consults: General surgery, Dr. Johney Maine  Family Communication: No family at bedside  Severity of Illness: The appropriate patient status for this patient is INPATIENT. Inpatient status is judged to be reasonable and necessary in order to provide the required intensity of service to ensure the patient's safety. The patient's presenting symptoms, physical exam findings, and initial radiographic and laboratory data in the context of their chronic comorbidities is felt to place them at high risk for further clinical deterioration. Furthermore, it is not anticipated that the patient will be medically stable for discharge from the hospital within 2 midnights of admission.   * I certify that at the point of admission it is my clinical judgment that the patient will require inpatient hospital care spanning beyond 2 midnights from the point of admission due to high intensity of service, high risk for further deterioration and high frequency of surveillance required.*  AuthorBarbette Merino, MD 10/20/2022 10:46 PM  For on call review [email or web address].

## 2022-10-20 NOTE — ED Notes (Signed)
Called [facility] for transport and spoke to [facility].

## 2022-10-20 NOTE — ED Provider Notes (Signed)
[facility] EMERGENCY DEPARTMENT AT [place] Provider Note   CSN: [id number] Arrival date & time: 10/20/22  1417     History {Add pertinent medical, surgical, social history, OB history to HPI:1} Chief Complaint  Patient presents with   Abdominal Pain    Linda Wilson is a 52 y.o. female with no past medical history presents to the ED complaining of bilateral upper abdominal pain and nausea for the last 2 days.  Patient was evaluated at urgent care and sent to the ED for further evaluation.  Patient states that she had no workup while at urgent care.  She denies fever, chills, vomiting, diarrhea, lower abdominal or flank pain, back pain, dysuria, hematuria, chest pain, shortness of breath, vaginal bleeding or discharge, or other complaints at this time.  Her only previous surgical history is a bilateral tubal ligation and a cesarean section.  Patient describes pain as very sharp and 10/10 in severity.  She has not taken any medication for the pain.  No identifiable exacerbating or relieving symptoms.  Last bowel movement was last night and was nonbloody and normal in characteristics.      Home Medications Prior to Admission medications   Medication Sig Start Date End Date Taking? Authorizing Provider  albuterol (VENTOLIN HFA) 108 (90 Base) MCG/ACT inhaler Inhale 2 puffs into the lungs every 6 (six) hours as needed for wheezing or shortness of breath. 12/22/21   Ivy Lynn, NP  azelastine (ASTELIN) 0.1 % nasal spray Place 1 spray into both nostrils 2 (two) times daily. Use in each nostril as directed 09/17/22   Mar Daring, PA-C  benzonatate (TESSALON) 200 MG capsule Take 1 capsule (200 mg total) by mouth 3 (three) times daily as needed. 09/17/22   Mar Daring, PA-C  clobetasol cream (TEMOVATE) 2.80 % Apply 1 application topically 2 (two) times daily. 07/07/22     cyclobenzaprine (FLEXERIL) 10 MG tablet Take 1 tablet (10 mg total) by mouth 3 (three)  times daily as needed for muscle spasms. 10/14/22   Mar Daring, PA-C  fluticasone (FLONASE) 50 MCG/ACT nasal spray Place 2 sprays into both nostrils daily. 12/14/21   Brunetta Jeans, PA-C  naproxen (NAPROSYN) 500 MG tablet Take 1 tablet (500 mg total) by mouth 2 (two) times daily with a meal. 10/14/22   Mar Daring, PA-C      Allergies    Patient has no known allergies.    Review of Systems   Review of Systems  Constitutional:  Negative for activity change, appetite change, chills and fever.  HENT:  Negative for congestion, ear pain, rhinorrhea and sore throat.   Eyes:  Negative for pain and visual disturbance.  Respiratory:  Negative for cough, chest tightness and shortness of breath.   Cardiovascular:  Negative for chest pain and palpitations.  Gastrointestinal:  Positive for abdominal pain and nausea. Negative for anal bleeding, blood in stool, constipation, diarrhea and vomiting.  Genitourinary:  Negative for decreased urine volume, difficulty urinating, dysuria, flank pain, frequency, hematuria, pelvic pain, urgency, vaginal bleeding, vaginal discharge and vaginal pain.  Musculoskeletal:  Negative for arthralgias, back pain and joint swelling.  Skin:  Negative for color change and rash.  Neurological:  Negative for dizziness, seizures, syncope, facial asymmetry, speech difficulty, weakness, light-headedness and headaches.  Psychiatric/Behavioral:  Negative for confusion.   All other systems reviewed and are negative.   Physical Exam Updated Vital Signs BP (!) 151/140   Pulse 95   Temp 98.4  F (36.9 C) (Oral)   Resp 15   Ht '5\' 9"'$  (1.753 m)   Wt 131.5 kg   LMP 11/09/2014   SpO2 95%   BMI 42.83 kg/m  Physical Exam Vitals and nursing note reviewed.  Constitutional:      General: She is in acute distress (mild secondary to pain).     Appearance: Normal appearance. She is not toxic-appearing or diaphoretic.  HENT:     Head: Normocephalic and atraumatic.      Mouth/Throat:     Mouth: Mucous membranes are moist.  Eyes:     General: No scleral icterus.    Extraocular Movements: Extraocular movements intact.     Conjunctiva/sclera: Conjunctivae normal.  Cardiovascular:     Rate and Rhythm: Normal rate and regular rhythm.     Heart sounds: No murmur heard. Pulmonary:     Effort: Pulmonary effort is normal.     Breath sounds: Normal breath sounds.  Abdominal:     General: Abdomen is flat and protuberant.     Palpations: Abdomen is soft. There is no shifting dullness, fluid wave or mass.     Tenderness: There is abdominal tenderness (mild RUQ, moderate epigastric and LUQ). There is no right CVA tenderness, left CVA tenderness, guarding or rebound. Negative signs include Murphy's sign, Rovsing's sign and McBurney's sign.  Musculoskeletal:        General: Normal range of motion.     Cervical back: Neck supple.     Right lower leg: No edema.     Left lower leg: No edema.  Skin:    General: Skin is warm and dry.     Capillary Refill: Capillary refill takes less than 2 seconds.  Neurological:     General: No focal deficit present.     Mental Status: She is alert. Mental status is at baseline.  Psychiatric:        Mood and Affect: Mood normal.        Behavior: Behavior normal.     ED Results / Procedures / Treatments   Labs (all labs ordered are listed, but only abnormal results are displayed) Labs Reviewed  COMPREHENSIVE METABOLIC PANEL - Abnormal; Notable for the following components:      Result Value   Potassium 3.4 (*)    Glucose, Bld 106 (*)    Calcium 8.5 (*)    Total Protein 8.2 (*)    AST 47 (*)    Total Bilirubin 2.1 (*)    All other components within normal limits  CBC - Abnormal; Notable for the following components:   WBC 13.4 (*)    RBC 5.27 (*)    Hemoglobin 10.8 (*)    MCV 70.2 (*)    MCH 20.5 (*)    MCHC 29.2 (*)    RDW 18.6 (*)    All other components within normal limits  CBC - Abnormal; Notable for the  following components:   Hemoglobin 9.5 (*)    HCT 33.4 (*)    MCV 72.8 (*)    MCH 20.7 (*)    MCHC 28.4 (*)    RDW 17.7 (*)    All other components within normal limits  LIPASE, BLOOD  URINALYSIS, ROUTINE W REFLEX MICROSCOPIC  HIV ANTIBODY (ROUTINE TESTING W REFLEX)  CREATININE, SERUM  COMPREHENSIVE METABOLIC PANEL  CBC    EKG None  Radiology US Abdomen Limited RUQ (LIVER/GB)  Result Date: 10/20/2022 CLINICAL DATA:  Epigastric pain for 2 days with nausea and vomiting. Stranding  along the porta hepatis on CT scan earlier today. EXAM: ULTRASOUND ABDOMEN LIMITED RIGHT UPPER QUADRANT COMPARISON:  CT scan 10/20/2022 FINDINGS: Gallbladder: Sonographic Murphy sign present. There is some mild sludge in the gallbladder without gallstones or gallbladder wall thickening. No overt pericholecystic fluid. Common bile duct: Not well seen on ultrasound, but of normal caliber on the CT scan earlier today. Liver: Diffuse hepatic accentuated echogenicity and reduced sonic penetration compatible with hepatic steatosis. No focal liver lesion is identified. Portal vein is patent on color Doppler imaging with normal direction of blood flow towards the liver. Other: None. IMPRESSION: 1. Sonographic Murphy sign present. There is some mild sludge in the gallbladder without gallstones or gallbladder wall thickening. No overt pericholecystic fluid, although there was stranding adjacent to parts of the gallbladder and porta hepatis on recent CT which can be seen in the setting of cholecystitis. Correlate clinically in management. 2. Hepatic steatosis. Electronically Signed   By: Van Clines [name]   On: 10/20/2022 17:49   CT ABDOMEN PELVIS W CONTRAST  Result Date: 10/20/2022 CLINICAL DATA:  Abdominal pain EXAM: CT ABDOMEN AND PELVIS WITH CONTRAST TECHNIQUE: Multidetector CT imaging of the abdomen and pelvis was performed using the standard protocol following bolus administration of intravenous contrast. RADIATION  DOSE REDUCTION: This exam was performed according to the departmental dose-optimization program which includes automated exposure control, adjustment of the mA and/or kV according to patient size and/or use of iterative reconstruction technique. CONTRAST:  125 mL OMNIPAQUE IOHEXOL 300 MG/ML  SOLN COMPARISON:  None Available. FINDINGS: Lower chest: No acute abnormality. Hepatobiliary: Subcentimeter fat attenuation nodule in the right lobe of the liver posteriorly not likely represent a clinically significant finding. 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 No intrahepatic ductal dilatation identified. Gallbladder is distended and there is some stranding in the pericholecystic fat. These findings suggest possible cholecystitis which could be assessed further with HIDA scan and/or right upper quadrant ultrasound if indicated. Pancreas: Unremarkable. No pancreatic ductal dilatation or surrounding inflammatory changes. Spleen: Normal in size without focal abnormality. Adrenals/Urinary Tract: Normal adrenal glands. No hydronephrosis. No renal parenchymal lesions. Nonobstructing 3 mm stones right kidney midpole. Stomach/Bowel: Stomach is within normal limits. Appendix appears normal. No evidence of bowel wall thickening, distention, or inflammatory changes. Vascular/Lymphatic: No significant vascular findings are present. No enlarged abdominal or pelvic lymph nodes. Reproductive: Uterus and bilateral adnexa are unremarkable. Other: No abdominal wall hernia or abnormality. No abdominopelvic ascites. Musculoskeletal: No acute or significant osseous findings. IMPRESSION: 1. Distended gallbladder with possible pericholecystic inflammatory changes. Consider possible cholecystitis. 2. Two right kidney 3 mm stones.  No hydronephrosis. 3. Otherwise no acute abdominal or pelvic pathology identified. Electronically Signed   By: Sammie Bench [name]   On: 10/20/2022 16:29    Procedures Procedures  {Document cardiac monitor, telemetry  assessment procedure when appropriate:1}  Medications Ordered in ED Medications  enoxaparin (LOVENOX) injection 40 mg (has no administration in time range)  dextrose 5 % in lactated ringers infusion ( Intravenous New Bag/Given 10/20/22 2302)  morphine (PF) 2 MG/ML injection 2 mg (has no administration in time range)  ondansetron (ZOFRAN) tablet 4 mg (has no administration in time range)    Or  ondansetron (ZOFRAN) injection 4 mg (has no administration in time range)  cefTRIAXone (ROCEPHIN) 2 g in sodium chloride 0.9 % 100 mL IVPB (has no administration in time range)  metroNIDAZOLE (FLAGYL) IVPB 500 mg (500 mg Intravenous New Bag/Given 10/20/22 2302)  iohexol (OMNIPAQUE) 300 MG/ML solution 125 mL (125 mLs Intravenous  Contrast Given 10/20/22 1605)  ketorolac (TORADOL) 15 MG/ML injection 15 mg (15 mg Intramuscular Given 10/20/22 1708)  ondansetron (ZOFRAN-ODT) disintegrating tablet 4 mg (4 mg Oral Given 10/20/22 1708)  lactated ringers bolus 1,000 mL (0 mLs Intravenous Stopped 10/20/22 1936)  cefTRIAXone (ROCEPHIN) 1 g in sodium chloride 0.9 % 100 mL IVPB (0 g Intravenous Stopped 10/20/22 2125)  See order set for medications ordered in ED  ED Course/ Medical Decision Making/ A&P Clinical Course as of 10/21/22 0034  Thu Oct 20, 2022  1808 Diffuse abdominal pain.  [CC]  1808 WBC(!): 13.4 [CC]    Clinical Course User Index [CC] Tretha Sciara, MD   {   Click here for ABCD2, HEART and other calculatorsREFRESH Note before signing :1}                          Medical Decision Making Amount and/or Complexity of Data Reviewed Labs:  Decision-making details documented in ED Course. Radiology: ordered.  Risk Prescription drug management. Decision regarding hospitalization.   This is a 52 year old female presenting to ED for evaluation of upper abdominal pain and nausea. The causes of abdominal pain include but are not limited to AAA, mesenteric ischemia, appendicitis, diverticulitis, DKA,  gastritis, gastroenteritis, AMI, nephrolithiasis, pancreatitis, peritonitis, intestinal ischemia, constipation, UTI,SBO/LBO, splenic rupture, biliary disease, IBD, IBS, PUD, or hepatitis. Pt not meeting SIRS criteria and does not appear toxic though does have mild to moderate tenderness on physical exam.   Declined pain and nausea medication on initial exam. On re-examination, stated she was driving but would take medication if ordered.   Consulted with Dr. Johney Maine with surgery, start on ceftriaxone and transfer for further surgical evaluation.   {Document critical care time when appropriate:1} {Document review of labs and clinical decision tools ie heart score, Chads2Vasc2 etc:1}  {Document your independent review of radiology images, and any outside records:1} {Document your discussion with family members, caretakers, and with consultants:1} {Document social determinants of health affecting pt's care:1} {Document your decision making why or why not admission, treatments were needed:1} Final Clinical Impression(s) / ED Diagnoses Final diagnoses:  Positive Murphy's Sign  Cholecystitis  Hypokalemia    Rx / DC Orders ED Discharge Orders     None

## 2022-10-20 NOTE — ED Notes (Signed)
IV removed at PA's request due to patient going in personal vehicle.

## 2022-10-20 NOTE — Discharge Instructions (Addendum)
Please head over to the [facility] as you are in need of a higher level of care than we can provide in the urgent care setting. Differential includes diverticulitis, cholecystitis, appendicitis, acute abdomen and this requires imaging like a CT scan, bloodwork to rule out. Please go straight to the emergency room.

## 2022-10-20 NOTE — ED Notes (Signed)
Patient is being discharged from the Urgent Care and sent to the Emergency Department via POV . Per Mani,PA, patient is in need of higher level of care due to abd pain. Patient is aware and verbalizes understanding of plan of care.  Vitals:   10/20/22 1314  BP: (!) 154/89  Pulse: (!) 102  Resp: 18  Temp: 98.8 F (37.1 C)  SpO2: 94%

## 2022-10-20 NOTE — ED Notes (Signed)
Patient transported to CT 

## 2022-10-20 NOTE — ED Notes (Signed)
Patient is going to [employer] via personal vehicle.

## 2022-10-20 NOTE — ED Notes (Signed)
Pt unable to void at this time. 

## 2022-10-20 NOTE — ED Notes (Addendum)
Patient complains of pain in the epigastric region that radiates across the thorax. Abdomen is soft and non-distended. Patient states she has not taken any meds to relieve the pain nor h2 blockers.

## 2022-10-20 NOTE — ED Triage Notes (Signed)
Pt c/o cental abd pain, nausea, decreased appetite day 2-NAD-steady gait

## 2022-10-21 ENCOUNTER — Inpatient Hospital Stay (HOSPITAL_COMMUNITY): Payer: Commercial Managed Care - PPO

## 2022-10-21 DIAGNOSIS — R1011 Right upper quadrant pain: Secondary | ICD-10-CM

## 2022-10-21 LAB — COMPREHENSIVE METABOLIC PANEL
ALT: 24 U/L (ref 0–44)
AST: 30 U/L (ref 15–41)
Albumin: 3.1 g/dL — ABNORMAL LOW (ref 3.5–5.0)
Alkaline Phosphatase: 70 U/L (ref 38–126)
Anion gap: 5 (ref 5–15)
BUN: 6 mg/dL (ref 6–20)
CO2: 29 mmol/L (ref 22–32)
Calcium: 8.2 mg/dL — ABNORMAL LOW (ref 8.9–10.3)
Chloride: 105 mmol/L (ref 98–111)
Creatinine, Ser: 0.8 mg/dL (ref 0.44–1.00)
GFR, Estimated: >60 mL/min (ref >60)
Glucose, Bld: 119 mg/dL — ABNORMAL HIGH (ref 70–99)
Potassium: 3.5 mmol/L (ref 3.5–5.1)
Sodium: 139 mmol/L (ref 135–145)
Total Bilirubin: 1.7 mg/dL — ABNORMAL HIGH (ref 0.3–1.2)
Total Protein: 6.5 g/dL (ref 6.5–8.1)

## 2022-10-21 LAB — CBC
HCT: 30.9 % — ABNORMAL LOW (ref 36.0–46.0)
Hemoglobin: 9 g/dL — ABNORMAL LOW (ref 12.0–15.0)
MCH: 20.7 pg — ABNORMAL LOW (ref 26.0–34.0)
MCHC: 29.1 g/dL — ABNORMAL LOW (ref 30.0–36.0)
MCV: 71 fL — ABNORMAL LOW (ref 80.0–100.0)
Platelets: 216 10*3/uL (ref 150–400)
RBC: 4.35 MIL/uL (ref 3.87–5.11)
RDW: 17.9 % — ABNORMAL HIGH (ref 11.5–15.5)
WBC: 8.3 10*3/uL (ref 4.0–10.5)
nRBC: 0 % (ref 0.0–0.2)

## 2022-10-21 LAB — CREATININE, SERUM
Creatinine, Ser: 0.87 mg/dL (ref 0.44–1.00)
GFR, Estimated: >60 mL/min (ref >60)

## 2022-10-21 LAB — HIV ANTIBODY (ROUTINE TESTING W REFLEX): HIV Screen 4th Generation wRfx: NONREACTIVE

## 2022-10-21 MED ORDER — TECHNETIUM TC 99M MEBROFENIN IV KIT
5.0000 | PACK | Freq: Once | INTRAVENOUS | Status: AC | PRN
  Administered 2022-10-21: 5 via INTRAVENOUS

## 2022-10-21 NOTE — Discharge Summary (Signed)
Physician Discharge Summary  Linda Wilson VHQ:[id number] DOB: 06/27/1971 DOA: 10/20/2022  PCP: Sharion Balloon, FNP  Admit date: 10/20/2022 Discharge date: 10/21/2022  Admitted From: Home Disposition:  Home  Recommendations for Outpatient Follow-up:  Follow up with PCP in 1 week Follow up with General surgery  Discharge Condition: Stable CODE STATUS: Full  Diet recommendation: Regular   Brief/Interim Summary: From H&P by Dr. Jonelle Sidle: "Linda Wilson is a 52 y.o. female with medical history significant of nephrolithiasis, asthma, who presented to [facility] with abdominal pain nausea vomiting.  Symptoms have been going on for 2 days.  Is 10 out of 10 in nature.  Decreased appetite.  No fever, no hematemesis no melena no diarrhea or constipation.  Patient has not had prior abdominal surgeries.  She was seen and evaluated.  Initial CT abdomen pelvis showed distended gallbladder.  Subsequent ultrasound of the right upper quadrant confirmed acute cholecystitis.  Surgery consulted and recommends admission to medical service.  Patient is hemodynamically stable."  General surgery recommended HIDA scan, which was negative. Bilirubin decreased. Diet was advanced. Patient will follow up outpatient.   Discharge Diagnoses:   Principal Problem:   RUQ pain Active Problems:   Nephrolithiasis   Obesity, Class III, BMI 40-49.9 (morbid obesity) (HCC)   Hypokalemia   Normocytic anemia    Discharge Instructions  Discharge Instructions     Call MD for:  difficulty breathing, headache or visual disturbances   Complete by: As directed    Call MD for:  extreme fatigue   Complete by: As directed    Call MD for:  persistant dizziness or light-headedness   Complete by: As directed    Call MD for:  persistant nausea and vomiting   Complete by: As directed    Call MD for:  severe uncontrolled pain   Complete by: As directed    Call MD for:  temperature >100.4   Complete by: As  directed    Discharge instructions   Complete by: As directed    You were cared for by a hospitalist during your hospital stay. If you have any questions about your discharge medications or the care you received while you were in the hospital after you are discharged, you can call the unit and ask to speak with the hospitalist on call if the hospitalist that took care of you is not available. Once you are discharged, your primary care physician will handle any further medical issues. Please note that NO REFILLS for any discharge medications will be authorized once you are discharged, as it is imperative that you return to your primary care physician (or establish a relationship with a primary care physician if you do not have one) for your aftercare needs so that they can reassess your need for medications and monitor your lab values.   Increase activity slowly   Complete by: As directed       Allergies as of 10/21/2022   No Known Allergies      Medication List     TAKE these medications    clobetasol cream 0.05 % Commonly known as: TEMOVATE Apply 1 application topically 2 (two) times daily.   cyclobenzaprine 10 MG tablet Commonly known as: FLEXERIL Take 1 tablet (10 mg total) by mouth 3 (three) times daily as needed for muscle spasms.   glucosamine-chondroitin 500-400 MG tablet Take 1 tablet by mouth daily.   loratadine 10 MG tablet Commonly known as: CLARITIN Take 10 mg by mouth daily.  naproxen 500 MG tablet Commonly known as: Naprosyn Take 1 tablet (500 mg total) by mouth 2 (two) times daily with a meal. What changed:  when to take this reasons to take this        Follow-up Information     Sharion Balloon, FNP Follow up.   Specialty: Family Medicine Contact information: [place] [address] [phone]         Erroll Luna, MD Follow up.   Specialty: General Surgery Why: If symptoms worsen, As needed Contact information: [place] [address] [phone]                No Known Allergies  Consultations: General surgery    Procedures/Studies: NM Hepatobiliary Liver Func  Result Date: 10/21/2022 CLINICAL DATA:  Right upper quadrant abdominal pain. EXAM: NUCLEAR MEDICINE HEPATOBILIARY IMAGING TECHNIQUE: Sequential images of the abdomen were obtained out to 60 minutes following intravenous administration of radiopharmaceutical. RADIOPHARMACEUTICALS:  5.0 mCi Tc-44m Choletec IV COMPARISON:  Ultrasound October 20, 2022 FINDINGS: Prompt uptake and biliary excretion of activity by the liver is seen. Gallbladder activity is visualized, consistent with patency of cystic duct. Biliary activity passes into small bowel, consistent with patent common bile duct. IMPRESSION: Patent cystic and common bile ducts. No evidence of acute cholecystitis Electronically Signed   By: JDahlia BailiffM.D.   On: 10/21/2022 12:10   UKoreaAbdomen Limited RUQ (LIVER/GB)  Result Date: 10/20/2022 CLINICAL DATA:  Epigastric pain for 2 days with nausea and vomiting. Stranding along the porta hepatis on CT scan earlier today. EXAM: ULTRASOUND ABDOMEN LIMITED RIGHT UPPER QUADRANT COMPARISON:  CT scan 10/20/2022 FINDINGS: Gallbladder: Sonographic Murphy sign present. There is some mild sludge in the gallbladder without gallstones or gallbladder wall thickening. No overt pericholecystic fluid. Common bile duct: Not well seen on ultrasound, but of normal caliber on the CT scan earlier today. Liver: Diffuse hepatic accentuated echogenicity and reduced sonic penetration compatible with hepatic steatosis. No focal liver lesion is identified. Portal vein is patent on color Doppler imaging with normal direction of blood flow towards the liver. Other: None. IMPRESSION: 1. Sonographic Murphy sign present. There is some mild sludge in the gallbladder without gallstones or gallbladder wall thickening. No overt pericholecystic fluid,  although there was stranding adjacent to parts of the gallbladder and porta hepatis on recent CT which can be seen in the setting of cholecystitis. Correlate clinically in management. 2. Hepatic steatosis. Electronically Signed   By: WVan ClinesM.D.   On: 10/20/2022 17:49   CT ABDOMEN PELVIS W CONTRAST  Result Date: 10/20/2022 CLINICAL DATA:  Abdominal pain EXAM: CT ABDOMEN AND PELVIS WITH CONTRAST TECHNIQUE: Multidetector CT imaging of the abdomen and pelvis was performed using the standard protocol following bolus administration of intravenous contrast. RADIATION DOSE REDUCTION: This exam was performed according to the departmental dose-optimization program which includes automated exposure control, adjustment of the mA and/or kV according to patient size and/or use of iterative reconstruction technique. CONTRAST:  125 mL OMNIPAQUE IOHEXOL 300 MG/ML  SOLN COMPARISON:  None Available. FINDINGS: Lower chest: No acute abnormality. Hepatobiliary: Subcentimeter fat attenuation nodule in the right lobe of the liver posteriorly not likely represent a clinically significant finding. 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 No intrahepatic ductal dilatation identified. Gallbladder is distended and there is some stranding in the pericholecystic fat. These findings suggest possible cholecystitis which could be assessed further with HIDA scan and/or right upper quadrant ultrasound if indicated. Pancreas:  Unremarkable. No pancreatic ductal dilatation or surrounding inflammatory changes. Spleen: Normal in size without focal abnormality. Adrenals/Urinary Tract: Normal adrenal glands. No hydronephrosis. No renal parenchymal lesions. Nonobstructing 3 mm stones right kidney midpole. Stomach/Bowel: Stomach is within normal limits. Appendix appears normal. No evidence of bowel wall thickening, distention, or inflammatory changes. Vascular/Lymphatic: No significant vascular findings are present. No enlarged abdominal or pelvic  lymph nodes. Reproductive: Uterus and bilateral adnexa are unremarkable. Other: No abdominal wall hernia or abnormality. No abdominopelvic ascites. Musculoskeletal: No acute or significant osseous findings. IMPRESSION: 1. Distended gallbladder with possible pericholecystic inflammatory changes. Consider possible cholecystitis. 2. Two right kidney 3 mm stones.  No hydronephrosis. 3. Otherwise no acute abdominal or pelvic pathology identified. Electronically Signed   By: Sammie Bench [name]   On: 10/20/2022 16:29       Discharge Exam: Vitals:   10/20/22 2232 10/21/22 0345  BP: (!) 151/140 137/69  Pulse: 95 82  Resp: 15   Temp:  98.2 F (36.8 C)  SpO2: 95% 97%    General: Pt is alert, awake, not in acute distress Cardiovascular: RRR, S1/S2 +, no edema Respiratory: CTA bilaterally, no wheezing, no rhonchi, no respiratory distress, no conversational dyspnea  Abdominal: Soft, Mild TTP RUQ, ND, bowel sounds + Extremities: no edema, no cyanosis Psych: Normal mood and affect, stable judgement and insight     The results of significant diagnostics from this hospitalization (including imaging, microbiology, ancillary and laboratory) are listed below for reference.     Microbiology: No results found for this or any previous visit (from the past 240 hour(s)).   Labs: BNP (last 3 results) No results for input(s): "BNP" in the last 8760 hours. Basic Metabolic Panel: Recent Labs  Lab 10/20/22 1514 10/20/22 2346 10/21/22 0553  NA 136  --  139  K 3.4*  --  3.5  CL 102  --  105  CO2 24  --  29  GLUCOSE 106*  --  119*  BUN 7  --  6  CREATININE 0.69 0.87 0.80  CALCIUM 8.5*  --  8.2*   Liver Function Tests: Recent Labs  Lab 10/20/22 1514 10/21/22 0553  AST 47* 30  ALT 31 24  ALKPHOS 97 70  BILITOT 2.1* 1.7*  PROT 8.2* 6.5  ALBUMIN 4.0 3.1*   Recent Labs  Lab 10/20/22 1514  LIPASE 27   No results for input(s): "AMMONIA" in the last 168 hours. CBC: Recent Labs  Lab  10/20/22 1514 10/20/22 2346 10/21/22 0553  WBC 13.4* 10.1 8.3  HGB 10.8* 9.5* 9.0*  HCT 37.0 33.4* 30.9*  MCV 70.2* 72.8* 71.0*  PLT 330 247 216   Cardiac Enzymes: No results for input(s): "CKTOTAL", "CKMB", "CKMBINDEX", "TROPONINI" in the last 168 hours. BNP: Invalid input(s): "POCBNP" CBG: No results for input(s): "GLUCAP" in the last 168 hours. D-Dimer No results for input(s): "DDIMER" in the last 72 hours. Hgb A1c No results for input(s): "HGBA1C" in the last 72 hours. Lipid Profile No results for input(s): "CHOL", "HDL", "LDLCALC", "TRIG", "CHOLHDL", "LDLDIRECT" in the last 72 hours. Thyroid function studies No results for input(s): "TSH", "T4TOTAL", "T3FREE", "THYROIDAB" in the last 72 hours.  Invalid input(s): "FREET3" Anemia work up No results for input(s): "VITAMINB12", "FOLATE", "FERRITIN", "TIBC", "IRON", "RETICCTPCT" in the last 72 hours. Urinalysis No results found for: "COLORURINE", "APPEARANCEUR", "LABSPEC", "PHURINE", "GLUCOSEU", "HGBUR", "BILIRUBINUR", "KETONESUR", "PROTEINUR", "UROBILINOGEN", "NITRITE", "LEUKOCYTESUR" Sepsis Labs Recent Labs  Lab 10/20/22 1514 10/20/22 2346 10/21/22 0553  WBC 13.4* 10.1 8.3  Microbiology No results found for this or any previous visit (from the past 240 hour(s)).   Patient was seen and examined on the day of discharge and was found to be in stable condition. Time coordinating discharge: 35 minutes including assessment and coordination of care, as well as examination of the patient.   SIGNED:  Dessa Phi, DO [facility] 10/21/2022, 2:16 PM

## 2022-10-21 NOTE — Consult Note (Signed)
Reason for Consult: Gallbladder Referring Physician: Maylene Roes MD  Linda Wilson is an 52 y.o. female.  HPI: Patient transferred from bed [facility] last night due to abdominal pain.  2-day history of bloating, nausea vomiting.  Complains of right upper quad abdominal pain.  Per chart she said it was 10 out of 10 this morning she has 0 out of 10 pain.  No history of fatty food intolerance but does avoid spicy foods.  This is the first episode she has had but mostly complains of bloating.  CT scan showed a dilated gallbladder.  Ultrasound showed no stones possibly sludge.  Questionable gallbladder wall thickening.  Past Medical History:  Diagnosis Date   Nephrolithiasis 10/20/2022    Past Surgical History:  Procedure Laterality Date   CESAREAN SECTION     INCISION AND DRAINAGE ABSCESS  02/15/2007   pubic abscess - I&D Dr Marlou Starks   TUBAL LIGATION  09/16/2005   Cheri Fowler, MD    Family History  Problem Relation Age of Onset   Healthy Mother    Heart disease Father        MI   Diabetes Father    Stroke Father     Social History:  reports that she has never smoked. She has never used smokeless tobacco. She reports that she does not drink alcohol and does not use drugs.  Allergies: No Known Allergies  Medications: I have reviewed the patient's current medications.  Results for orders placed or performed during the hospital encounter of 10/20/22 (from the past 48 hour(s))  Lipase, blood     Status: None   Collection Time: 10/20/22  3:14 PM  Result Value Ref Range   Lipase 27 11 - 51 U/L    Comment: Performed at [facility], [address] [place], [place] [address]  Comprehensive metabolic panel     Status: Abnormal   Collection Time: 10/20/22  3:14 PM  Result Value Ref Range   Sodium 136 135 - 145 mmol/L   Potassium 3.4 (L) 3.5 - 5.1 mmol/L   Chloride 102 98 - 111 mmol/L   CO2 24 22 - 32 mmol/L   Glucose, Bld 106 (H) 70 - 99 mg/dL    Comment: Glucose  reference range applies only to samples taken after fasting for at least 8 hours.   BUN 7 6 - 20 mg/dL   Creatinine, Ser 0.69 0.44 - 1.00 mg/dL   Calcium 8.5 (L) 8.9 - 10.3 mg/dL   Total Protein 8.2 (H) 6.5 - 8.1 g/dL   Albumin 4.0 3.5 - 5.0 g/dL   AST 47 (H) 15 - 41 U/L   ALT 31 0 - 44 U/L   Alkaline Phosphatase 97 38 - 126 U/L   Total Bilirubin 2.1 (H) 0.3 - 1.2 mg/dL   GFR, Estimated >60 >60 mL/min    Comment: (NOTE) Calculated using the CKD-EPI Creatinine Equation (2021)    Anion gap 10 5 - 15    Comment: Performed at [facility], [address] [place], [place] [address]  CBC     Status: Abnormal   Collection Time: 10/20/22  3:14 PM  Result Value Ref Range   WBC 13.4 (H) 4.0 - 10.5 K/uL   RBC 5.27 (H) 3.87 - 5.11 MIL/uL   Hemoglobin 10.8 (L) 12.0 - 15.0 g/dL   HCT 37.0 36.0 - 46.0 %   MCV 70.2 (L) 80.0 - 100.0 fL   MCH 20.5 (L) 26.0 -  34.0 pg   MCHC 29.2 (L) 30.0 - 36.0 g/dL   RDW 18.6 (H) 11.5 - 15.5 %   Platelets 330 150 - 400 K/uL   nRBC 0.0 0.0 - 0.2 %    Comment: Performed at [facility], [address] [place], [place] [address]  HIV Antibody (routine testing w rflx)     Status: None   Collection Time: 10/20/22 11:46 PM  Result Value Ref Range   HIV Screen 4th Generation wRfx Non Reactive Non Reactive    Comment: Performed at [facility], [place] [address] [place], [place] [address]  CBC     Status: Abnormal   Collection Time: 10/20/22 11:46 PM  Result Value Ref Range   WBC 10.1 4.0 - 10.5 K/uL   RBC 4.59 3.87 - 5.11 MIL/uL   Hemoglobin 9.5 (L) 12.0 - 15.0 g/dL   HCT 33.4 (L) 36.0 - 46.0 %   MCV 72.8 (L) 80.0 - 100.0 fL   MCH 20.7 (L) 26.0 - 34.0 pg   MCHC 28.4 (L) 30.0 - 36.0 g/dL   RDW 17.7 (H) 11.5 - 15.5 %   Platelets 247 150 - 400 K/uL   nRBC 0.0 0.0 - 0.2 %    Comment: Performed at [facility], [place] [address], [place], [place] [address]  Creatinine, serum     Status: None   Collection Time: 10/20/22 11:46 PM   Result Value Ref Range   Creatinine, Ser 0.87 0.44 - 1.00 mg/dL   GFR, Estimated >60 >60 mL/min    Comment: (NOTE) Calculated using the CKD-EPI Creatinine Equation (2021) Performed at [facility] [address] [place] [address], [place], [place] [address]   Comprehensive metabolic panel     Status: Abnormal   Collection Time: 10/21/22  5:53 AM  Result Value Ref Range   Sodium 139 135 - 145 mmol/L   Potassium 3.5 3.5 - 5.1 mmol/L   Chloride 105 98 - 111 mmol/L   CO2 29 22 - 32 mmol/L   Glucose, Bld 119 (H) 70 - 99 mg/dL    Comment: Glucose reference range applies only to samples taken after fasting for at least 8 hours.   BUN 6 6 - 20 mg/dL   Creatinine, Ser 0.80 0.44 - 1.00 mg/dL   Calcium 8.2 (L) 8.9 - 10.3 mg/dL   Total Protein 6.5 6.5 - 8.1 g/dL   Albumin 3.1 (L) 3.5 - 5.0 g/dL   AST 30 15 - 41 U/L   ALT 24 0 - 44 U/L   Alkaline Phosphatase 70 38 - 126 U/L   Total Bilirubin 1.7 (H) 0.3 - 1.2 mg/dL   GFR, Estimated >60 >60 mL/min    Comment: (NOTE) Calculated using the CKD-EPI Creatinine Equation (2021)    Anion gap 5 5 - 15    Comment: Performed at [facility], [place] [address] [place], [place] [address]  CBC     Status: Abnormal   Collection Time: 10/21/22  5:53 AM  Result Value Ref Range   WBC 8.3 4.0 - 10.5 K/uL   RBC 4.35 3.87 - 5.11 MIL/uL   Hemoglobin 9.0 (L) 12.0 - 15.0 g/dL   HCT 30.9 (L) 36.0 - 46.0 %   MCV 71.0 (L) 80.0 - 100.0 fL   MCH 20.7 (L) 26.0 - 34.0 pg   MCHC 29.1 (L) 30.0 - 36.0 g/dL   RDW 17.9 (H) 11.5 - 15.5 %   Platelets 216 150 - 400  K/uL   nRBC 0.0 0.0 - 0.2 %    Comment: Performed at [facility], [place] [address], [place], [place] [address]    US Abdomen Limited RUQ (LIVER/GB)  Result Date: 10/20/2022 CLINICAL DATA:  Epigastric pain for 2 days with nausea and vomiting. Stranding along the porta hepatis on CT scan earlier today. EXAM: ULTRASOUND ABDOMEN LIMITED RIGHT UPPER QUADRANT COMPARISON:  CT scan 10/20/2022 FINDINGS: Gallbladder:  Sonographic Murphy sign present. There is some mild sludge in the gallbladder without gallstones or gallbladder wall thickening. No overt pericholecystic fluid. Common bile duct: Not well seen on ultrasound, but of normal caliber on the CT scan earlier today. Liver: Diffuse hepatic accentuated echogenicity and reduced sonic penetration compatible with hepatic steatosis. No focal liver lesion is identified. Portal vein is patent on color Doppler imaging with normal direction of blood flow towards the liver. Other: None. IMPRESSION: 1. Sonographic Murphy sign present. There is some mild sludge in the gallbladder without gallstones or gallbladder wall thickening. No overt pericholecystic fluid, although there was stranding adjacent to parts of the gallbladder and porta hepatis on recent CT which can be seen in the setting of cholecystitis. Correlate clinically in management. 2. Hepatic steatosis. Electronically Signed   By: Van Clines [name]   On: 10/20/2022 17:49   CT ABDOMEN PELVIS W CONTRAST  Result Date: 10/20/2022 CLINICAL DATA:  Abdominal pain EXAM: CT ABDOMEN AND PELVIS WITH CONTRAST TECHNIQUE: Multidetector CT imaging of the abdomen and pelvis was performed using the standard protocol following bolus administration of intravenous contrast. RADIATION DOSE REDUCTION: This exam was performed according to the departmental dose-optimization program which includes automated exposure control, adjustment of the mA and/or kV according to patient size and/or use of iterative reconstruction technique. CONTRAST:  125 mL OMNIPAQUE IOHEXOL 300 MG/ML  SOLN COMPARISON:  None Available. FINDINGS: Lower chest: No acute abnormality. Hepatobiliary: Subcentimeter fat attenuation nodule in the right lobe of the liver posteriorly not likely represent a clinically significant finding. 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 No intrahepatic ductal dilatation identified. Gallbladder is distended and there is some stranding in the  pericholecystic fat. These findings suggest possible cholecystitis which could be assessed further with HIDA scan and/or right upper quadrant ultrasound if indicated. Pancreas: Unremarkable. No pancreatic ductal dilatation or surrounding inflammatory changes. Spleen: Normal in size without focal abnormality. Adrenals/Urinary Tract: Normal adrenal glands. No hydronephrosis. No renal parenchymal lesions. Nonobstructing 3 mm stones right kidney midpole. Stomach/Bowel: Stomach is within normal limits. Appendix appears normal. No evidence of bowel wall thickening, distention, or inflammatory changes. Vascular/Lymphatic: No significant vascular findings are present. No enlarged abdominal or pelvic lymph nodes. Reproductive: Uterus and bilateral adnexa are unremarkable. Other: No abdominal wall hernia or abnormality. No abdominopelvic ascites. Musculoskeletal: No acute or significant osseous findings. IMPRESSION: 1. Distended gallbladder with possible pericholecystic inflammatory changes. Consider possible cholecystitis. 2. Two right kidney 3 mm stones.  No hydronephrosis. 3. Otherwise no acute abdominal or pelvic pathology identified. Electronically Signed   By: Sammie Bench [name]   On: 10/20/2022 16:29    Review of Systems  Gastrointestinal:  Positive for abdominal pain, nausea and vomiting.  All other systems reviewed and are negative.  Blood pressure 137/69, pulse 82, temperature 98.2 F (36.8 C), temperature source Oral, resp. rate 15, height '5\' 9"'$  (1.753 m), weight 131.5 kg, last menstrual period 11/09/2014, SpO2 97 %. Physical Exam HENT:     Head: Normocephalic.  Cardiovascular:     Rate and Rhythm: Normal rate.  Pulmonary:  Effort: Pulmonary effort is normal.  Abdominal:     General: Abdomen is protuberant.     Palpations: Abdomen is soft.     Tenderness: There is no abdominal tenderness.     Hernia: No hernia is present.  Skin:    General: Skin is warm.  Neurological:     General: No  focal deficit present.     Mental Status: She is alert.     Assessment/Plan: Abdominal pain question cholecystitis without obvious cholelithiasis and normal white count  Recommend HIDA study today to evaluate.  Okay to have clear liquids after HIDA.  Repeat LFTs since bilirubin is 2.1 and she may have passed a common bile duct stone.  If HIDA positive, plan laparoscopic cholecystectomy tomorrow morning.  If HIDA negative, advance diet and discharge if she tolerates this.  Deseray Daponte A Aracelie Addis 10/21/2022, 8:34 AM   Total time 45 minutes face-to-face, chart review, review studies, discussed treatment options, answer questions

## 2022-10-21 NOTE — Progress Notes (Signed)
HIDA negative for cholecystitis. General surgery will sign off. Please call as needed.  Obie Dredge, PA-C [facility] Please see Amion for pager number during day hours 7:00am-4:30pm

## 2022-10-24 ENCOUNTER — Telehealth: Payer: Self-pay

## 2022-10-24 NOTE — Telephone Encounter (Signed)
Transition Care Management Unsuccessful Follow-up Telephone Call  Date of discharge and from where:  [facility] 10/21/2022  Attempts:  1st Attempt  Reason for unsuccessful TCM follow-up call:  Left voice message Juanda Crumble, [place] Direct Dial [phone]

## 2022-10-25 NOTE — Telephone Encounter (Signed)
Transition Care Management Follow-up Telephone Call Date of discharge and from where: [facility] 10/21/2022 How have you been since you were released from the hospital? Still having fevers Any questions or concerns? No  Items Reviewed: Did the pt receive and understand the discharge instructions provided? Yes  Medications obtained and verified? Yes  Other? No  Any new allergies since your discharge? No  Dietary orders reviewed? Yes Do you have support at home? Yes   Home Care and Equipment/Supplies: Were home health services ordered? no If so, what is the name of the agency? N/a  Has the agency set up a time to come to the patient's home? no Were any new equipment or medical supplies ordered?  No What is the name of the medical supply agency? N/a Were you able to get the supplies/equipment? no Do you have any questions related to the use of the equipment or supplies? No  Functional Questionnaire: (I = Independent and D = Dependent) ADLs: I  Bathing/Dressing- I  Meal Prep- I  Eating- I  Maintaining continence- I  Transferring/Ambulation- I  Managing Meds- I  Follow up appointments reviewed:  PCP Hospital f/u appt confirmed? Yes  Scheduled to see Evelina Dun on 10/31/2022 @ 3:05. [facility] f/u appt confirmed? No   Are transportation arrangements needed? No  If their condition worsens, is the pt aware to call PCP or go to the Emergency Dept.? Yes Was the patient provided with contact information for the PCP's office or ED? Yes Was to pt encouraged to call back with questions or concerns? Yes Juanda Crumble, LPN [place] Direct Dial [phone]

## 2022-10-27 ENCOUNTER — Telehealth: Payer: Commercial Managed Care - PPO | Admitting: Family Medicine

## 2022-10-27 DIAGNOSIS — R110 Nausea: Secondary | ICD-10-CM

## 2022-10-27 DIAGNOSIS — K819 Cholecystitis, unspecified: Secondary | ICD-10-CM

## 2022-10-27 NOTE — Progress Notes (Signed)
Because gallbladder pains and symptoms need to be treated in person to make sure nothing has changed, I feel your condition warrants further evaluation and I recommend that you be seen in a face to face visit.   NOTE: There will be NO CHARGE for this eVisit

## 2022-10-31 ENCOUNTER — Other Ambulatory Visit (HOSPITAL_COMMUNITY): Payer: Self-pay

## 2022-10-31 ENCOUNTER — Encounter: Payer: Self-pay | Admitting: Family

## 2022-10-31 ENCOUNTER — Ambulatory Visit: Payer: Commercial Managed Care - PPO | Admitting: Family

## 2022-10-31 VITALS — BP 148/78 | HR 98 | Temp 97.6°F | Ht 69.0 in | Wt 291.2 lb

## 2022-10-31 DIAGNOSIS — Z09 Encounter for follow-up examination after completed treatment for conditions other than malignant neoplasm: Secondary | ICD-10-CM

## 2022-10-31 DIAGNOSIS — K819 Cholecystitis, unspecified: Secondary | ICD-10-CM

## 2022-10-31 DIAGNOSIS — R1010 Upper abdominal pain, unspecified: Secondary | ICD-10-CM | POA: Diagnosis not present

## 2022-10-31 MED ORDER — ONDANSETRON HCL 4 MG PO TABS
4.0000 mg | ORAL_TABLET | Freq: Three times a day (TID) | ORAL | 0 refills | Status: AC | PRN
  Filled 2022-10-31: qty 60, 20d supply, fill #0

## 2022-10-31 NOTE — Patient Instructions (Signed)
Cholecystitis  Cholecystitis is inflammation of the gallbladder. Cholecystitis is often called a gallbladder attack. The gallbladder is a pear-shaped organ that lies beneath the liver on the right side of the body. The gallbladder stores a fluid that helps the body digest fats (bile). If bile builds up in your gallbladder, your gallbladder becomes inflamed and can develop a serious infection. This condition may occur suddenly. Cholecystitis is a serious condition and requires treatment. What are the causes? The most common cause of this condition is gallstones. Gallstones can block the tube (duct) that carries bile out of your gallbladder. This causes bile to build up. Other causes include: Damage to the gallbladder due to decreased blood flow. Infection in the bile duct. Scars, kinks, or adhesions in the bile duct. Tumors in the liver, pancreas, or gallbladder. What increases the risk? You are more likely to develop this condition if: You are female and between the ages of 9-62. You take birth control pills or use estrogen. You take certain medicines that increase your likelihood of developing gallstones. You are obese. You have a severe reaction to an infection (sepsis). The infection may be bacterial, fungal, parasitic, or viral. You have been hospitalized due to trauma, such as a burn or critical illness. You have not eaten or drank for a long period of time (prolonged fasting). What are the signs or symptoms? Symptoms of this condition include: Tenderness in the upper right part of the abdomen. A lump over the gallbladder. Bloating in the abdomen. Nausea. Vomiting. Fever. Chills. How is this diagnosed? This condition is diagnosed with a medical history and physical exam. You may also have other tests, including: Imaging tests, such as: An ultrasound of the abdomen. A CT scan of the abdomen. A gallbladder nuclear scan (HIDA cholescintigraphy). This allows your health care  provider to see the bile moving from your liver to your gallbladder and to your small intestine. MRI. Blood tests, such as: A complete blood count. The white blood cell count may be higher than normal. C-reactive protein (CRP) test. The level of CRP will be higher if there is an infection. Liver function tests. Certain types of gallstones cause some results to be higher than normal. How is this treated? Treatment may include: Pain medicine and IV fluids. Not eating or drinking (fasting). This helps to take stress off of your gallbladder. Antibiotic medicine. This is usually given through an IV. Surgery to remove your gallbladder (cholecystectomy). Gallbladder drainage. In this procedure, a tube is placed into the gallbladder to drain fluid. This may be done for people with moderate to severe cholecystitis who cannot have surgery. Follow these instructions at home: Medicines  Take over-the-counter and prescription medicines only as told by your health care provider. If you were prescribed an antibiotic medicine, take it as told by your health care provider. Do not stop taking the antibiotic even if you start to feel better. General instructions Follow instructions from your health care provider about what to eat or drink. When you are allowed to eat, avoid eating or drinking anything that triggers your symptoms. Do not use any products that contain nicotine or tobacco. These products include cigarettes, chewing tobacco, and vaping devices, such as e-cigarettes. If you need help quitting, ask your health care provider. Keep all follow-up visits. This is important. Contact a health care provider if: Your pain is not controlled with medicine. You have a fever. Get help right away if: Your pain moves to another part of your abdomen or to  your back. You continue to have symptoms or you develop new symptoms even with treatment. These symptoms may be an emergency. Get help right away. Call  911. Do not wait to see if the symptoms will go away. Do not drive yourself to the hospital. Summary Cholecystitis is inflammation of the gallbladder. The most common cause of this condition is gallstones. Gallstones can block the tube (duct) that carries bile out of your gallbladder. Common symptoms include tenderness in the abdomen, nausea, vomiting, fever, and chills. This condition is treated with fasting, pain medicine, surgery to remove the gallbladder, antibiotic medicines, and gallbladder drainage. Follow your health care provider's instructions for eating and drinking. Avoid eating anything that triggers your symptoms. This information is not intended to replace advice given to you by your health care provider. Make sure you discuss any questions you have with your health care provider. Document Revised: 03/16/2021 Document Reviewed: 03/16/2021 Elsevier Patient Education  [place].

## 2022-10-31 NOTE — Progress Notes (Signed)
Subjective:    Patient ID: Linda Wilson, female    DOB: [birth year], 52 y.o.   MRN: [id number]  Chief Complaint  Patient presents with   Hospitalization Follow-up   Today's visit was for Transitional Care Management.  The patient was discharged from [facility] on 10/21/22 with a primary diagnosis of RUQ and cholecystitis.   Contact with the patient and/or caregiver, by a clinical staff member, was made on 10/25/22 and was documented as a telephone encounter within the EMR.  Through chart review and discussion with the patient I have determined that management of their condition is of moderate complexity.   "Initial CT abdomen pelvis showed distended gallbladder.  Subsequent ultrasound of the right upper quadrant confirmed acute cholecystitis.  Surgery consulted and recommends admission to medical service.  Patient is hemodynamically stable."   General surgery recommended HIDA scan, which was negative. Bilirubin decreased. Diet was advanced. Patient will follow up outpatient. "  She continues to have nausea and mild upper abdominal pain. Has had a fever 102.  Abdominal Pain This is a new problem. The current episode started 1 to 4 weeks ago. The pain is located in the LUQ and RUQ. The pain is at a severity of 6/10. The pain is moderate. The abdominal pain does not radiate. Associated symptoms include belching, constipation, a fever and nausea. Pertinent negatives include no diarrhea, dysuria, flatus, frequency or vomiting. The pain is aggravated by movement and eating. She has tried acetaminophen for the symptoms. The treatment provided mild relief. Prior diagnostic workup includes CT scan.      Review of Systems  Constitutional:  Positive for fever.  Gastrointestinal:  Positive for abdominal pain, constipation and nausea. Negative for diarrhea, flatus and vomiting.  Genitourinary:  Negative for dysuria and frequency.  All other systems reviewed and are negative.       Objective:   Physical Exam Vitals reviewed.  Constitutional:      General: She is not in acute distress.    Appearance: She is well-developed.  HENT:     Head: Normocephalic and atraumatic.  Eyes:     Pupils: Pupils are equal, round, and reactive to light.  Neck:     Thyroid: No thyromegaly.  Cardiovascular:     Rate and Rhythm: Normal rate and regular rhythm.     Heart sounds: Normal heart sounds. No murmur heard. Pulmonary:     Effort: Pulmonary effort is normal. No respiratory distress.     Breath sounds: Normal breath sounds. No wheezing.  Abdominal:     General: Bowel sounds are normal. There is no distension.     Palpations: Abdomen is soft.     Tenderness: There is abdominal tenderness (RUQ, LLQ).  Musculoskeletal:        General: No tenderness. Normal range of motion.     Cervical back: Normal range of motion and neck supple.  Skin:    General: Skin is warm and dry.  Neurological:     Mental Status: She is alert and oriented to person, place, and time.     Cranial Nerves: No cranial nerve deficit.     Deep Tendon Reflexes: Reflexes are normal and symmetric.  Psychiatric:        Behavior: Behavior normal.        Thought Content: Thought content normal.        Judgment: Judgment normal.       BP (!) 148/78   Pulse 98   Temp 97.6 F (36.4 C) (  Temporal)   Ht '5\' 9"'$  (1.753 m)   Wt 291 lb 3.2 oz (132.1 kg)   LMP 11/09/2014   SpO2 99%   BMI 43.00 kg/m      Assessment & Plan:  Linda Wilson comes in today with chief complaint of Hospitalization Follow-up   Diagnosis and orders addressed:  1. Cholecystitis - Ambulatory referral to General Surgery - CBC with Differential/Platelet - BMP8+EGFR - ondansetron (ZOFRAN) 4 MG tablet; Take 1 tablet (4 mg total) by mouth every 8 (eight) hours as needed for nausea or vomiting.  Dispense: 60 tablet; Refill: 0  2. Pain of upper abdomen - Ambulatory referral to General Surgery - CBC with Differential/Platelet -  BMP8+EGFR  3. Hospital discharge follow-up - Ambulatory referral to General Surgery - CBC with Differential/Platelet - BMP8+EGFR   Given pain, nausea, and fevers will do referral to General Surgery. Discussed in length red flags of increased abdominal pain, continued fevers to go to ED Low fat diet  Labs pending Health Maintenance reviewed Diet and exercise encouraged  Follow up plan: Keep chronic follow up   Evelina Dun, FNP

## 2022-11-01 ENCOUNTER — Emergency Department (HOSPITAL_COMMUNITY): Payer: Commercial Managed Care - PPO

## 2022-11-01 ENCOUNTER — Encounter (HOSPITAL_COMMUNITY): Payer: Self-pay

## 2022-11-01 ENCOUNTER — Observation Stay (HOSPITAL_COMMUNITY)
Admission: EM | Admit: 2022-11-01 | Discharge: 2022-11-03 | Disposition: A | Discharge status: Home / Self Care | Payer: Commercial Managed Care - PPO | Attending: Internal Medicine | Admitting: Internal Medicine

## 2022-11-01 ENCOUNTER — Other Ambulatory Visit: Payer: Self-pay

## 2022-11-01 DIAGNOSIS — R1010 Upper abdominal pain, unspecified: Secondary | ICD-10-CM | POA: Diagnosis not present

## 2022-11-01 DIAGNOSIS — D509 Iron deficiency anemia, unspecified: Secondary | ICD-10-CM

## 2022-11-01 DIAGNOSIS — R110 Nausea: Secondary | ICD-10-CM | POA: Diagnosis not present

## 2022-11-01 DIAGNOSIS — R109 Unspecified abdominal pain: Secondary | ICD-10-CM | POA: Diagnosis present

## 2022-11-01 DIAGNOSIS — D649 Anemia, unspecified: Secondary | ICD-10-CM | POA: Diagnosis not present

## 2022-11-01 DIAGNOSIS — Z1152 Encounter for screening for COVID-19: Secondary | ICD-10-CM | POA: Diagnosis not present

## 2022-11-01 DIAGNOSIS — R1011 Right upper quadrant pain: Secondary | ICD-10-CM | POA: Diagnosis not present

## 2022-11-01 DIAGNOSIS — R1013 Epigastric pain: Principal | ICD-10-CM | POA: Insufficient documentation

## 2022-11-01 DIAGNOSIS — R7989 Other specified abnormal findings of blood chemistry: Secondary | ICD-10-CM | POA: Diagnosis not present

## 2022-11-01 DIAGNOSIS — E66813 Obesity, class 3: Secondary | ICD-10-CM | POA: Diagnosis present

## 2022-11-01 DIAGNOSIS — K824 Cholesterolosis of gallbladder: Secondary | ICD-10-CM | POA: Diagnosis not present

## 2022-11-01 DIAGNOSIS — R933 Abnormal findings on diagnostic imaging of other parts of digestive tract: Secondary | ICD-10-CM

## 2022-11-01 DIAGNOSIS — R918 Other nonspecific abnormal finding of lung field: Secondary | ICD-10-CM | POA: Diagnosis not present

## 2022-11-01 DIAGNOSIS — R9431 Abnormal electrocardiogram [ECG] [EKG]: Secondary | ICD-10-CM | POA: Diagnosis not present

## 2022-11-01 DIAGNOSIS — R079 Chest pain, unspecified: Secondary | ICD-10-CM | POA: Diagnosis not present

## 2022-11-01 LAB — BMP8+EGFR
BUN/Creatinine Ratio: 12 (ref 9–23)
BUN: 8 mg/dL (ref 6–24)
CO2: 23 mmol/L (ref 20–29)
Calcium: 9.2 mg/dL (ref 8.7–10.2)
Chloride: 103 mmol/L (ref 96–106)
Creatinine, Ser: 0.68 mg/dL (ref 0.57–1.00)
Glucose: 93 mg/dL (ref 70–99)
Potassium: 4.3 mmol/L (ref 3.5–5.2)
Sodium: 142 mmol/L (ref 134–144)
eGFR: 105 mL/min/{1.73_m2} (ref >59)

## 2022-11-01 LAB — CBC WITH DIFFERENTIAL/PLATELET
Abs Immature Granulocytes: 0.07 10*3/uL (ref 0.00–0.07)
Basophils Absolute: 0 10*3/uL (ref 0.0–0.2)
Basophils Absolute: 0.1 10*3/uL (ref 0.0–0.1)
Basophils Relative: 0 %
Basos: 0 %
EOS (ABSOLUTE): 0.3 10*3/uL (ref 0.0–0.4)
Eos: 3 %
Eosinophils Absolute: 0.2 10*3/uL (ref 0.0–0.5)
Eosinophils Relative: 2 %
HCT: 35.4 % — ABNORMAL LOW (ref 36.0–46.0)
Hematocrit: 36.3 % (ref 34.0–46.6)
Hemoglobin: 10.6 g/dL — ABNORMAL LOW (ref 11.1–15.9)
Hemoglobin: 10.5 g/dL — ABNORMAL LOW (ref 12.0–15.0)
Immature Grans (Abs): 0.1 10*3/uL (ref 0.0–0.1)
Immature Granulocytes: 1 %
Immature Granulocytes: 1 %
Lymphocytes Absolute: 2.8 10*3/uL (ref 0.7–3.1)
Lymphocytes Relative: 22 %
Lymphs Abs: 2.5 10*3/uL (ref 0.7–4.0)
Lymphs: 26 %
MCH: 20.7 pg — ABNORMAL LOW (ref 26.6–33.0)
MCH: 21.3 pg — ABNORMAL LOW (ref 26.0–34.0)
MCHC: 29.2 g/dL — ABNORMAL LOW (ref 31.5–35.7)
MCHC: 29.7 g/dL — ABNORMAL LOW (ref 30.0–36.0)
MCV: 71 fL — ABNORMAL LOW (ref 79–97)
MCV: 71.7 fL — ABNORMAL LOW (ref 80.0–100.0)
Monocytes Absolute: 0.8 10*3/uL (ref 0.1–0.9)
Monocytes Absolute: 1.2 10*3/uL — ABNORMAL HIGH (ref 0.1–1.0)
Monocytes Relative: 10 %
Monocytes: 8 %
Neutro Abs: 7.7 10*3/uL (ref 1.7–7.7)
Neutrophils Absolute: 6.8 10*3/uL (ref 1.4–7.0)
Neutrophils Relative %: 65 %
Neutrophils: 62 %
Platelets: 403 10*3/uL (ref 150–450)
Platelets: 395 10*3/uL (ref 150–400)
RBC: 5.13 x10E6/uL (ref 3.77–5.28)
RBC: 4.94 MIL/uL (ref 3.87–5.11)
RDW: 17.4 % — ABNORMAL HIGH (ref 11.7–15.4)
RDW: 18 % — ABNORMAL HIGH (ref 11.5–15.5)
WBC: 10.9 10*3/uL — ABNORMAL HIGH (ref 3.4–10.8)
WBC: 11.7 10*3/uL — ABNORMAL HIGH (ref 4.0–10.5)
nRBC: 0 % (ref 0.0–0.2)

## 2022-11-01 LAB — COMPREHENSIVE METABOLIC PANEL
ALT: 14 U/L (ref 0–44)
AST: 21 U/L (ref 15–41)
Albumin: 3.4 g/dL — ABNORMAL LOW (ref 3.5–5.0)
Alkaline Phosphatase: 75 U/L (ref 38–126)
Anion gap: 11 (ref 5–15)
BUN: 6 mg/dL (ref 6–20)
CO2: 24 mmol/L (ref 22–32)
Calcium: 8.7 mg/dL — ABNORMAL LOW (ref 8.9–10.3)
Chloride: 102 mmol/L (ref 98–111)
Creatinine, Ser: 0.71 mg/dL (ref 0.44–1.00)
GFR, Estimated: >60 mL/min (ref >60)
Glucose, Bld: 123 mg/dL — ABNORMAL HIGH (ref 70–99)
Potassium: 3.7 mmol/L (ref 3.5–5.1)
Sodium: 137 mmol/L (ref 135–145)
Total Bilirubin: 1.4 mg/dL — ABNORMAL HIGH (ref 0.3–1.2)
Total Protein: 7.8 g/dL (ref 6.5–8.1)

## 2022-11-01 LAB — URINALYSIS, ROUTINE W REFLEX MICROSCOPIC
Bilirubin Urine: NEGATIVE
Glucose, UA: NEGATIVE mg/dL
Hgb urine dipstick: NEGATIVE
Ketones, ur: NEGATIVE mg/dL
Leukocytes,Ua: NEGATIVE
Nitrite: NEGATIVE
Protein, ur: NEGATIVE mg/dL
Specific Gravity, Urine: 1.019 (ref 1.005–1.030)
pH: 6 (ref 5.0–8.0)

## 2022-11-01 LAB — LIPASE, BLOOD: Lipase: 25 U/L (ref 11–51)

## 2022-11-01 LAB — RESP PANEL BY RT-PCR (RSV, FLU A&B, COVID)  RVPGX2
Influenza A by PCR: NEGATIVE
Influenza B by PCR: NEGATIVE
Resp Syncytial Virus by PCR: NEGATIVE
SARS Coronavirus 2 by RT PCR: NEGATIVE

## 2022-11-01 LAB — TROPONIN I (HIGH SENSITIVITY)
Troponin I (High Sensitivity): 6 ng/L (ref <18)
Troponin I (High Sensitivity): 4 ng/L (ref <18)

## 2022-11-01 LAB — FERRITIN: Ferritin: 30 ng/mL (ref 11–307)

## 2022-11-01 LAB — IRON AND TIBC
Iron: 17 ug/dL — ABNORMAL LOW (ref 28–170)
Saturation Ratios: 5 % — ABNORMAL LOW (ref 10.4–31.8)
TIBC: 361 ug/dL (ref 250–450)
UIBC: 344 ug/dL

## 2022-11-01 LAB — D-DIMER, QUANTITATIVE: D-Dimer, Quant: 3.77 ug/mL-FEU — ABNORMAL HIGH (ref 0.00–0.50)

## 2022-11-01 MED ORDER — ACETAMINOPHEN 650 MG RE SUPP: 650.0000 mg | Freq: Four times a day (QID) | RECTAL | Status: DC | PRN

## 2022-11-01 MED ORDER — ENOXAPARIN SODIUM 60 MG/0.6ML IJ SOSY
60.0000 mg | PREFILLED_SYRINGE | INTRAMUSCULAR | Status: DC
  Administered 2022-11-02: 60 mg via SUBCUTANEOUS
  Filled 2022-11-01: qty 0.6

## 2022-11-01 MED ORDER — LACTATED RINGERS IV BOLUS
1000.0000 mL | Freq: Once | INTRAVENOUS | Status: AC
  Administered 2022-11-01: 1000 mL via INTRAVENOUS

## 2022-11-01 MED ORDER — ONDANSETRON HCL 4 MG/2ML IJ SOLN
4.0000 mg | Freq: Once | INTRAMUSCULAR | Status: AC
  Administered 2022-11-01: 4 mg via INTRAVENOUS
  Filled 2022-11-01: qty 2

## 2022-11-01 MED ORDER — ACETAMINOPHEN 325 MG PO TABS
650.0000 mg | ORAL_TABLET | Freq: Four times a day (QID) | ORAL | Status: DC | PRN
  Administered 2022-11-01 – 2022-11-02 (×2): 650 mg via ORAL
  Filled 2022-11-01 (×2): qty 2

## 2022-11-01 MED ORDER — SODIUM CHLORIDE 0.9% FLUSH
3.0000 mL | Freq: Two times a day (BID) | INTRAVENOUS | Status: DC
  Administered 2022-11-01 – 2022-11-02 (×3): 3 mL via INTRAVENOUS

## 2022-11-01 MED ORDER — PANTOPRAZOLE SODIUM 40 MG IV SOLR
40.0000 mg | Freq: Once | INTRAVENOUS | Status: AC
  Administered 2022-11-01: 40 mg via INTRAVENOUS
  Filled 2022-11-01: qty 10

## 2022-11-01 MED ORDER — FENTANYL CITRATE PF 50 MCG/ML IJ SOSY
50.0000 ug | PREFILLED_SYRINGE | Freq: Once | INTRAMUSCULAR | Status: AC
  Administered 2022-11-01: 50 ug via INTRAVENOUS
  Filled 2022-11-01: qty 1

## 2022-11-01 MED ORDER — IOHEXOL 350 MG/ML SOLN
75.0000 mL | Freq: Once | INTRAVENOUS | Status: AC | PRN
  Administered 2022-11-01: 75 mL via INTRAVENOUS

## 2022-11-01 NOTE — ED Notes (Signed)
Got patient into a gown on the monitor patinet is resting with call bell in reach

## 2022-11-01 NOTE — Care Management (Signed)
  Transition of Care [facility]) Screening Note   Patient Details  Name: Linda Wilson Date of Birth: 07/06/1971   Transition of Care [facility]) CM/SW Contact:    Carles Collet, RN Phone Number: 11/01/2022, 5:01 PM    Transition of Care Department [facility]) has reviewed patient and no TOC needs have been identified at this time. We will continue to monitor patient advancement through interdisciplinary progression rounds. If new patient transition needs arise, please place a TOC consult.

## 2022-11-01 NOTE — Care Plan (Signed)
Pt arrived via stretcher and transport.  She is ambulatory and walks to bathroom to void.  She is A&O, has c/o pain to left flank and is guarding that area with her hand.  NADN. Sreece, RN

## 2022-11-01 NOTE — ED Triage Notes (Signed)
Pt c/o LUQ pain that radiates to left side of chest that started at 0130 today. Pt denies N/V.

## 2022-11-01 NOTE — ED Provider Notes (Signed)
[facility] Provider Note   CSN: [id number] Arrival date & time: 11/01/22  7829     History  Chief Complaint  Patient presents with   Abdominal Pain    Linda Wilson is a 52 y.o. female.  Upper abdominal pain since last night.  Brief episodes of radiating into her chest.  Severe pain in her upper abdomen that radiates to both upper quadrants.  Pain has been ongoing for the past several weeks and she was hospitalized with concern for gallbladder pathology.  She was found to have gallbladder sludge but negative HIDA scan. She reports she has had pain intermittently since then but never as severe as tonight.  This is the same pain for which she was hospitalized in January.  Denies any history of ulcers or acid reflux but has never had an endoscopy.  No cardiac history.  Has had nausea but no vomiting.  Reports fever last night up to 102 which is new.  Some urinary frequency and urgency. She had a HIDA scan while she was hospitalized that was negative for cholecystitis. She was told to follow a low-fat diet and soft diet which she has been doing.  The history is provided by the patient.  Abdominal Pain Associated symptoms: nausea   Associated symptoms: no cough, no dysuria, no fever, no hematuria, no shortness of breath and no vomiting        Home Medications Prior to Admission medications   Medication Sig Start Date End Date Taking? Authorizing Provider  clobetasol cream (TEMOVATE) 5.62 % Apply 1 application topically 2 (two) times daily. Patient not taking: Reported on 10/31/2022 07/07/22     glucosamine-chondroitin 500-400 MG tablet Take 1 tablet by mouth daily.    [provider]  loratadine (CLARITIN) 10 MG tablet Take 10 mg by mouth daily.    [provider]  ondansetron (ZOFRAN) 4 MG tablet Take 1 tablet (4 mg total) by mouth every 8 (eight) hours as needed for nausea or vomiting. 10/31/22   Sharion Balloon, FNP       Allergies    Patient has no known allergies.    Review of Systems   Review of Systems  Constitutional:  Positive for appetite change. Negative for activity change and fever.  HENT:  Negative for congestion and rhinorrhea.   Respiratory:  Negative for cough and shortness of breath.   Gastrointestinal:  Positive for abdominal pain and nausea. Negative for vomiting.  Genitourinary:  Negative for dysuria and hematuria.  Musculoskeletal:  Negative for arthralgias and myalgias.  Neurological:  Positive for weakness. Negative for headaches.    Physical Exam Updated Vital Signs BP (!) 148/75   Pulse 99   Temp 97.7 F (36.5 C) (Oral)   Resp 18   Ht '5\' 9"'$  (1.753 m)   Wt 132.1 kg   LMP 11/09/2014   SpO2 98%   BMI 43.01 kg/m  Physical Exam Vitals and nursing note reviewed.  Constitutional:      General: She is not in acute distress.    Appearance: She is well-developed. She is not ill-appearing.  HENT:     Head: Normocephalic and atraumatic.     Mouth/Throat:     Pharynx: No oropharyngeal exudate.  Eyes:     Conjunctiva/sclera: Conjunctivae normal.     Pupils: Pupils are equal, round, and reactive to light.  Neck:     Comments: No meningismus. Cardiovascular:     Rate and Rhythm: Normal rate  and regular rhythm.     Heart sounds: Normal heart sounds. No murmur heard. Pulmonary:     Effort: Pulmonary effort is normal. No respiratory distress.     Breath sounds: Normal breath sounds.  Chest:     Chest wall: No tenderness.  Abdominal:     Palpations: Abdomen is soft.     Tenderness: There is abdominal tenderness. There is guarding. There is no rebound.     Comments: TTP epigastric, LUQ, RUQ tenderness. No lower abdominal tenderness.  Musculoskeletal:        General: No tenderness. Normal range of motion.     Cervical back: Normal range of motion and neck supple.  Skin:    General: Skin is warm.  Neurological:     Mental Status: She is alert and oriented to person,  place, and time.     Cranial Nerves: No cranial nerve deficit.     Motor: No abnormal muscle tone.     Coordination: Coordination normal.     Comments: No ataxia on finger to nose bilaterally. No pronator drift. 5/5 strength throughout. CN 2-12 intact.Equal grip strength. Sensation intact.   Psychiatric:        Behavior: Behavior normal.     ED Results / Procedures / Treatments   Labs (all labs ordered are listed, but only abnormal results are displayed) Labs Reviewed  COMPREHENSIVE METABOLIC PANEL - Abnormal; Notable for the following components:      Result Value   Glucose, Bld 123 (*)    Calcium 8.7 (*)    Albumin 3.4 (*)    Total Bilirubin 1.4 (*)    All other components within normal limits  CBC WITH DIFFERENTIAL/PLATELET - Abnormal; Notable for the following components:   WBC 11.7 (*)    Hemoglobin 10.5 (*)    HCT 35.4 (*)    MCV 71.7 (*)    MCH 21.3 (*)    MCHC 29.7 (*)    RDW 18.0 (*)    Monocytes Absolute 1.2 (*)    All other components within normal limits  URINALYSIS, ROUTINE W REFLEX MICROSCOPIC - Abnormal; Notable for the following components:   Color, Urine AMBER (*)    APPearance HAZY (*)    All other components within normal limits  D-DIMER, QUANTITATIVE - Abnormal; Notable for the following components:   D-Dimer, Quant 3.77 (*)    All other components within normal limits  RESP PANEL BY RT-PCR (RSV, FLU A&B, COVID)  RVPGX2  LIPASE, BLOOD  FERRITIN  IRON AND TIBC  TROPONIN I (HIGH SENSITIVITY)  TROPONIN I (HIGH SENSITIVITY)    EKG EKG Interpretation  Date/Time:  Tuesday November 01 2022 08:59:24 EST Ventricular Rate:  99 PR Interval:  130 QRS Duration: 78 QT Interval:  342 QTC Calculation: 438 R Axis:   -4 Text Interpretation: Normal sinus rhythm Normal ECG When compared with ECG of 20-Oct-2022 15:09, PREVIOUS ECG IS PRESENT No significant change was found Confirmed by Ezequiel Essex [id number]) on 11/01/2022 10:52:51 AM  Radiology CT Angio Chest  PE W and/or Wo Contrast  Result Date: 11/01/2022 CLINICAL DATA:  Pulmonary embolism (PE) suspected, low to intermediate prob, positive D-dimer EXAM: CT ANGIOGRAPHY CHEST WITH CONTRAST TECHNIQUE: Multidetector CT imaging of the chest was performed using the standard protocol during bolus administration of intravenous contrast. Multiplanar CT image reconstructions and MIPs were obtained to evaluate the vascular anatomy. RADIATION DOSE REDUCTION: This exam was performed according to the departmental dose-optimization program which includes automated exposure control, adjustment of the  mA and/or kV according to patient size and/or use of iterative reconstruction technique. CONTRAST:  75 mL Omnipaque 350non-ionic IV contrast. COMPARISON:  None Available. FINDINGS: Cardiovascular: Satisfactory opacification of the pulmonary arteries to the segmental level. No evidence of pulmonary embolism. Normal heart size. No pericardial effusion. Mediastinum/Nodes: No enlarged mediastinal, hilar, or axillary lymph nodes. Thyroid gland, trachea, and esophagus demonstrate no significant findings. Lungs/Pleura: Lungs are clear. No pleural effusion or pneumothorax. Musculoskeletal: No chest wall abnormality. No acute or significant osseous findings. There are thoracic degenerative changes. Review of the MIP images confirms the above findings. IMPRESSION: No evidence of PE, aneurysm or dissection. Electronically Signed   By: Sammie Bench [name]   On: 11/01/2022 14:10   CT ABDOMEN PELVIS W CONTRAST  Result Date: 11/01/2022 CLINICAL DATA:  Abdominal pain EXAM: CT ABDOMEN AND PELVIS WITH CONTRAST TECHNIQUE: Multidetector CT imaging of the abdomen and pelvis was performed using the standard protocol following bolus administration of intravenous contrast. RADIATION DOSE REDUCTION: This exam was performed according to the departmental dose-optimization program which includes automated exposure control, adjustment of the mA and/or kV  according to patient size and/or use of iterative reconstruction technique. CONTRAST:  75 mL OMNIPAQUE IOHEXOL 350 MG/ML SOLN COMPARISON:  None Available. FINDINGS: Lower chest: No acute abnormality. Hepatobiliary: Subcentimeter hypodensity inferior right lobe of liver too small to characterize on CT scan. There was no intrahepatic ductal dilatation. No gallstones, gallbladder wall thickening, or extrahepatic biliary dilatation. Pancreas: Unremarkable. No pancreatic ductal dilatation or surrounding inflammatory changes. Spleen: Splenomegaly noted, 18.6 cm Adrenals/Urinary Tract: Adrenal glands are unremarkable. Kidneys are normal, without renal calculi, focal lesion, or hydronephrosis. Bladder is unremarkable. Stomach/Bowel: Stomach is within normal limits. Appendix appears normal. No evidence of bowel wall thickening, distention, or inflammatory changes. Vascular/Lymphatic: Unremarkable appearance of the aorta and IVC. Retro aortic left renal vein noted. There is adenopathy identified in the upper abdomen mesentery with portacaval node measuring 2.4 cm. There is some fat stranding noted in the transverse mesentery which is a nonspecific finding that can be seen with inflammation. Reproductive: Uterus and bilateral adnexa are unremarkable. Other: No abdominal wall hernia or abnormality. No abdominopelvic ascites. Musculoskeletal: No acute or significant osseous findings. IMPRESSION: 1. Upper abdominal transverse mesenteric fat stranding which can be seen with a nonspecific inflammatory process. 2. Splenomegaly. 3. Upper abdominal mesenteric adenopathy. Electronically Signed   By: Sammie Bench [name]   On: 11/01/2022 14:06   US Abdomen Limited RUQ (LIVER/GB)  Result Date: 11/01/2022 CLINICAL DATA:  Right upper quadrant pain since last night EXAM: ULTRASOUND ABDOMEN LIMITED RIGHT UPPER QUADRANT COMPARISON:  None Available. FINDINGS: Gallbladder: Mild sludge in the gallbladder. The gallbladder is otherwise normal  in appearance. Common bile duct: Diameter: 4.5 mm on limited images. Liver: Diffuse increased echogenicity. Mildly nodular contour. No focal mass. Portal vein is patent on color Doppler imaging with normal direction of blood flow towards the liver. Other: None. IMPRESSION: 1. Increased heterogeneous echogenicity in the liver and a mildly nodular contour. The contour also appears mildly nodular on the CT scan dated October 20, 2022. The findings suggest cirrhosis and superimposed hepatic steatosis. 2. Mild sludge in an otherwise normal gallbladder. Electronically Signed   By: Dorise Bullion III [name]   On: 11/01/2022 10:51   DG Chest 2 View  Result Date: 11/01/2022 CLINICAL DATA:  Pain. EXAM: CHEST - 2 VIEW COMPARISON:  December 22, 2021 FINDINGS: The heart size and mediastinal contours are within normal limits. Both lungs are clear. The visualized skeletal  structures are unremarkable. IMPRESSION: No active cardiopulmonary disease. Electronically Signed   By: Dorise Bullion III [name]   On: 11/01/2022 09:44    Procedures Procedures    Medications Ordered in ED Medications  lactated ringers bolus 1,000 mL (has no administration in time range)  ondansetron (ZOFRAN) injection 4 mg (has no administration in time range)  fentaNYL (SUBLIMAZE) injection 50 mcg (has no administration in time range)  pantoprazole (PROTONIX) injection 40 mg (has no administration in time range)    ED Course/ Medical Decision Making/ A&P                             Medical Decision Making Amount and/or Complexity of Data Reviewed Labs: ordered. Decision-making details documented in ED Course. Radiology: ordered and independent interpretation performed. Decision-making details documented in ED Course. ECG/medicine tests: ordered and independent interpretation performed. Decision-making details documented in ED Course.  Risk Prescription drug management. Decision regarding hospitalization.   Recurrent upper abdominal  pain with nausea similar to previous episode.  Ultrasound showed gallbladder sludge but no cholecystitis negative HIDA scan in January.  Return to similar pain in her epigastrium and radiates up into her chest.  Ultrasound today shows hepatic steatosis.  No evidence of cholecystitis.  Steatosis and possibly cirrhosis.  LFTs and lipase are normal.  Mild leukocytosis.  CT scan today shows upper abdominal stranding and mesenteric adenopathy.  Discussed with general surgery.  No indication for emergent surgical intervention.  May consider HIDA scan if symptoms are persistent.  Recommend discussion with gastroenterology /  Discussed with Dr. Bryan Lemma gastroenterology.  He agrees with IV PPI.  Will plan EGD tomorrow.  Patient agreeable after discussion of going home versus admission she prefers to be admitted.  Continue IV PPI and IV fluids overnight.  No indication for emergent surgical intervention but may consider repeating HIDA scan if symptoms persist.  Admission discussed with Dr. Trilby Drummer        Final Clinical Impression(s) / ED Diagnoses Final diagnoses:  None    Rx / DC Orders ED Discharge Orders     None         Woodrow Dulski, Annie Main, MD 11/01/22 1559

## 2022-11-01 NOTE — ED Provider Triage Note (Signed)
Emergency Medicine Provider Triage Evaluation Note  Linda Wilson , a 52 y.o. female  was evaluated in triage.  Pt complains of upper abdominal pain and chest pain.  This has been going on for the last few weeks intermittently, states this morning she was woken up by change in her chest as well as her upper abdomen and in bandlike attribution.  Does not radiate to the back, no nausea or vomiting.  Has a gallbladder.  Review of Systems  Per HPI  Physical Exam  BP (!) 148/75   Pulse 99   Temp 97.7 F (36.5 C) (Oral)   Resp 18   Ht '5\' 9"'$  (1.753 m)   Wt 132.1 kg   LMP 11/09/2014   SpO2 98%   BMI 43.01 kg/m  Gen:   Awake, no distress   Resp:  Normal effort MSK:   Moves extremities without difficulty  Other:  Right upper quadrant tenderness, epigastric tenderness and left upper quadrant tenderness right upper and lower extremity pulses are symmetric bilaterally.  Mildly tachycardic with a regular rate and rhythm.  Medical Decision Making  Medically screening exam initiated at 9:24 AM.  Appropriate orders placed.  Linda Wilson was informed that the remainder of the evaluation will be completed by another provider, this initial triage assessment does not replace that evaluation, and the importance of remaining in the ED until their evaluation is complete.     Sherrill Raring, [place] 11/01/22 [phone]

## 2022-11-01 NOTE — H&P (View-Only) (Signed)
Attending physician's note   I have taken a history, reviewed the chart, and examined the patient. I performed a substantive portion of this encounter, including complete performance of at least one of the key components, in conjunction with the APP. I agree with the APP's note, impression, and recommendations with my edits.   52 year old female with medical history as outlined below presents with acute recurrence of epigastric > LUQ pain and nausea, fever.  Recent admission 1/25-26 for similar symptoms, although reports this pain is more intense.  On that admission, had suspicion for cholecystitis on CT, but ultrasound without GB wall thickening and subsequent HIDA was negative and recommended by surgery for outpatient follow-up.  Now with acute recurrence of pain.  ER evaluation notable for the following: - Albumin 3.4, T. bili 1.4 (and downtrending) otherwise normal liver enzymes - WBC 11.7, H/H10.5/35 with MCV/RDW 71/18 - Lipase normal, troponin negative - Elevated D-dimer but subsequent CT PE was negative for PE - RUQ [place]: GB sludge, CBD 4.5 mm, mildly nodular liver contour - CT A/P: 4 abdominal mesenteric adenopathy which is new from CT in 1/25.  Splenomegaly  1) Epigastric/LUQ pain 2) Nausea 3) Mesenteric adenopathy on CT CT would be suggestive of infectious etiology, although interesting that the CT findings are new and had very similar symptoms at the end of January - Plan for expedited workup with EGD tomorrow - Pain control and antiemetics per primary service  4) Splenomegaly - No other features of impaired hepatic synthetic function on serologies.  Will check INR - Evaluate for esophageal varices, PHG, etc. time EGD as above  5) Microcytic anemia - Check iron studies - Will need outpatient colonoscopy for routine screening and evaluation of anemia if found to have have iron efficiency  The indications, risks, and benefits of EGD were explained to the patient in detail.  Risks include but are not limited to bleeding, perforation, adverse reaction to medications, and cardiopulmonary compromise. Sequelae include but are not limited to the possibility of surgery, hospitalization, and mortality. The patient verbalized understanding and wished to proceed. All questions answered      Gerrit Heck, DO, FACG [phone] office                                                     Consultation Note   Referring Provider:  Family Medicine PCP: Sharion Balloon, FNP Primary Gastroenterologist: Althia Forts        Reason for consultation: abdominal pain, N/V, anemia   Hospital Day: 1  Assessment    # 52 yo female with mid upper abdominal pain.  Admitted late January with mid upper abdominal pain, mildly elevated bilirubin and initial concern for acute cholecystitis but HIDA was negative, symptoms improved and surgery recommended outpatient follow up.  She has ongoing / worsening mid upper abdominal pain and development of fever this am. Tbilirubin trending down. Some N/V but not predominant symptom. Repeat imaging today suggests probable gallbladder sludge, new upper abdominal transverse mesenteric fat stranding and upper abdominal mesenteric adenopathy.   # Splenomegaly and ? Cirrhosis on [place]. Platelets normal at 395.   # Microcytic anemia, possibly chronic and of unclear etiology. Hgb is at her baseline of 10.4. No overt GI bleeding. No prior workup.   # See PMH for additional medical problems   Plan  Iron studies.  Will arrange for EGD to be done in am. Depending on clinical course she may be able to go home afterwards.  She got a dose of SQ lovenox this afternoon which should be fine for am EGD. Next dose wouldn't be due until tomorrow afternoon after time of EGD and we can hold that if warranted  Outpatient colonoscopy for IDA Obtain INR. Further evaluation of splenomegaly / ? Cirrhosis findings on [place] as outpatient.   HPI   Patient is a  52 yo  female with a PMH of nephrolithiasis, asthma, hepatic steatosis, obesity .   See PMH for any additional medical problems.  Adonis Brook was hospitalized the end of January with RUQ pain, N/V. Tbili was 2.1, alk phos normal, AST minimally elevated at 47 and ALT normal. WBC was 13K. She had microcytic anemia with hgb of 10.8. CT scan suggested possible cholecystitis. US showed mild sludge in gallbladder but no stones or gb wall thickening. Surgery evaluated. HIDA was negative. Outpatient follow up recommended.   Adonis Brook saw her PCP yesterday with persistent abdominal pain. Told to come to ED if pain got worse. The pain escalated earlier this am and she spiked a  fever of 102. The mid upper abdominal pain has been intermittent since it started late last month. The pain doesn't radiate through to her back. It has no relationship to eating though her appetite has been diminished. She isn't really having any significant N/V. She hasn't been taking any NSAIDs. Prior to late last month she had never had this type of pain before. She has taken Tums for the pain but they don't really help. She hasn't tried anything else to relieve the pain. No lower GI complaints. Bowel movements are normal. No black stools, no blood in stools. She doesn't feel well but is worried about missing work.   Summary of work up thus far:  VSS, afebrile Mild leukocytosis Microcytic anemia -Hgb 10.6, MCV 71 Mild hyperbilirubinemia - tbili of 1.4.   RUQ [place] -  mildly nodular contour.  The findings suggest cirrhosis and superimposed hepatic steatosis.Mild sludge in an otherwise normal gallbladder. CTA chest- No evidence of PE, aneurysm or dissection. CT AP with contrast-  Upper abdominal transverse mesenteric fat stranding which can be seen with a nonspecific inflammatory process.Marland Kitchen Splenomegaly. Upper abdominal mesenteric adenopathy.  Previous GI Evaluation     Recent Labs and Imaging CT Angio Chest PE W and/or Wo Contrast  Result Date:  11/01/2022 CLINICAL DATA:  Pulmonary embolism (PE) suspected, low to intermediate prob, positive D-dimer EXAM: CT ANGIOGRAPHY CHEST WITH CONTRAST TECHNIQUE: Multidetector CT imaging of the chest was performed using the standard protocol during bolus administration of intravenous contrast. Multiplanar CT image reconstructions and MIPs were obtained to evaluate the vascular anatomy. RADIATION DOSE REDUCTION: This exam was performed according to the departmental dose-optimization program which includes automated exposure control, adjustment of the mA and/or kV according to patient size and/or use of iterative reconstruction technique. CONTRAST:  75 mL Omnipaque 350non-ionic IV contrast. COMPARISON:  None Available. FINDINGS: Cardiovascular: Satisfactory opacification of the pulmonary arteries to the segmental level. No evidence of pulmonary embolism. Normal heart size. No pericardial effusion. Mediastinum/Nodes: No enlarged mediastinal, hilar, or axillary lymph nodes. Thyroid gland, trachea, and esophagus demonstrate no significant findings. Lungs/Pleura: Lungs are clear. No pleural effusion or pneumothorax. Musculoskeletal: No chest wall abnormality. No acute or significant osseous findings. There are thoracic degenerative changes. Review of the MIP images confirms the above findings. IMPRESSION: No evidence of  PE, aneurysm or dissection. Electronically Signed   By: Sammie Bench [name]   On: 11/01/2022 14:10   CT ABDOMEN PELVIS W CONTRAST  Result Date: 11/01/2022 CLINICAL DATA:  Abdominal pain EXAM: CT ABDOMEN AND PELVIS WITH CONTRAST TECHNIQUE: Multidetector CT imaging of the abdomen and pelvis was performed using the standard protocol following bolus administration of intravenous contrast. RADIATION DOSE REDUCTION: This exam was performed according to the departmental dose-optimization program which includes automated exposure control, adjustment of the mA and/or kV according to patient size and/or use of  iterative reconstruction technique. CONTRAST:  75 mL OMNIPAQUE IOHEXOL 350 MG/ML SOLN COMPARISON:  None Available. FINDINGS: Lower chest: No acute abnormality. Hepatobiliary: Subcentimeter hypodensity inferior right lobe of liver too small to characterize on CT scan. There was no intrahepatic ductal dilatation. No gallstones, gallbladder wall thickening, or extrahepatic biliary dilatation. Pancreas: Unremarkable. No pancreatic ductal dilatation or surrounding inflammatory changes. Spleen: Splenomegaly noted, 18.6 cm Adrenals/Urinary Tract: Adrenal glands are unremarkable. Kidneys are normal, without renal calculi, focal lesion, or hydronephrosis. Bladder is unremarkable. Stomach/Bowel: Stomach is within normal limits. Appendix appears normal. No evidence of bowel wall thickening, distention, or inflammatory changes. Vascular/Lymphatic: Unremarkable appearance of the aorta and IVC. Retro aortic left renal vein noted. There is adenopathy identified in the upper abdomen mesentery with portacaval node measuring 2.4 cm. There is some fat stranding noted in the transverse mesentery which is a nonspecific finding that can be seen with inflammation. Reproductive: Uterus and bilateral adnexa are unremarkable. Other: No abdominal wall hernia or abnormality. No abdominopelvic ascites. Musculoskeletal: No acute or significant osseous findings. IMPRESSION: 1. Upper abdominal transverse mesenteric fat stranding which can be seen with a nonspecific inflammatory process. 2. Splenomegaly. 3. Upper abdominal mesenteric adenopathy. Electronically Signed   By: Sammie Bench [name]   On: 11/01/2022 14:06   US Abdomen Limited RUQ (LIVER/GB)  Result Date: 11/01/2022 CLINICAL DATA:  Right upper quadrant pain since last night EXAM: ULTRASOUND ABDOMEN LIMITED RIGHT UPPER QUADRANT COMPARISON:  None Available. FINDINGS: Gallbladder: Mild sludge in the gallbladder. The gallbladder is otherwise normal in appearance. Common bile duct:  Diameter: 4.5 mm on limited images. Liver: Diffuse increased echogenicity. Mildly nodular contour. No focal mass. Portal vein is patent on color Doppler imaging with normal direction of blood flow towards the liver. Other: None. IMPRESSION: 1. Increased heterogeneous echogenicity in the liver and a mildly nodular contour. The contour also appears mildly nodular on the CT scan dated October 20, 2022. The findings suggest cirrhosis and superimposed hepatic steatosis. 2. Mild sludge in an otherwise normal gallbladder. Electronically Signed   By: Dorise Bullion III [name]   On: 11/01/2022 10:51   DG Chest 2 View  Result Date: 11/01/2022 CLINICAL DATA:  Pain. EXAM: CHEST - 2 VIEW COMPARISON:  December 22, 2021 FINDINGS: The heart size and mediastinal contours are within normal limits. Both lungs are clear. The visualized skeletal structures are unremarkable. IMPRESSION: No active cardiopulmonary disease. Electronically Signed   By: Dorise Bullion III [name]   On: 11/01/2022 09:44   NM Hepatobiliary Liver Func  Result Date: 10/21/2022 CLINICAL DATA:  Right upper quadrant abdominal pain. EXAM: NUCLEAR MEDICINE HEPATOBILIARY IMAGING TECHNIQUE: Sequential images of the abdomen were obtained out to 60 minutes following intravenous administration of radiopharmaceutical. RADIOPHARMACEUTICALS:  5.0 mCi Tc-26m Choletec IV COMPARISON:  Ultrasound October 20, 2022 FINDINGS: Prompt uptake and biliary excretion of activity by the liver is seen. Gallbladder activity is visualized, consistent with patency of cystic duct. Biliary activity passes into  small bowel, consistent with patent common bile duct. IMPRESSION: Patent cystic and common bile ducts. No evidence of acute cholecystitis Electronically Signed   By: Dahlia Bailiff [name]   On: 10/21/2022 12:10   US Abdomen Limited RUQ (LIVER/GB)  Result Date: 10/20/2022 CLINICAL DATA:  Epigastric pain for 2 days with nausea and vomiting. Stranding along the porta hepatis on CT scan  earlier today. EXAM: ULTRASOUND ABDOMEN LIMITED RIGHT UPPER QUADRANT COMPARISON:  CT scan 10/20/2022 FINDINGS: Gallbladder: Sonographic Murphy sign present. There is some mild sludge in the gallbladder without gallstones or gallbladder wall thickening. No overt pericholecystic fluid. Common bile duct: Not well seen on ultrasound, but of normal caliber on the CT scan earlier today. Liver: Diffuse hepatic accentuated echogenicity and reduced sonic penetration compatible with hepatic steatosis. No focal liver lesion is identified. Portal vein is patent on color Doppler imaging with normal direction of blood flow towards the liver. Other: None. IMPRESSION: 1. Sonographic Murphy sign present. There is some mild sludge in the gallbladder without gallstones or gallbladder wall thickening. No overt pericholecystic fluid, although there was stranding adjacent to parts of the gallbladder and porta hepatis on recent CT which can be seen in the setting of cholecystitis. Correlate clinically in management. 2. Hepatic steatosis. Electronically Signed   By: Van Clines [name]   On: 10/20/2022 17:49   CT ABDOMEN PELVIS W CONTRAST  Result Date: 10/20/2022 CLINICAL DATA:  Abdominal pain EXAM: CT ABDOMEN AND PELVIS WITH CONTRAST TECHNIQUE: Multidetector CT imaging of the abdomen and pelvis was performed using the standard protocol following bolus administration of intravenous contrast. RADIATION DOSE REDUCTION: This exam was performed according to the departmental dose-optimization program which includes automated exposure control, adjustment of the mA and/or kV according to patient size and/or use of iterative reconstruction technique. CONTRAST:  125 mL OMNIPAQUE IOHEXOL 300 MG/ML  SOLN COMPARISON:  None Available. FINDINGS: Lower chest: No acute abnormality. Hepatobiliary: Subcentimeter fat attenuation nodule in the right lobe of the liver posteriorly not likely represent a clinically significant finding. 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 No intrahepatic ductal dilatation identified. Gallbladder is distended and there is some stranding in the pericholecystic fat. These findings suggest possible cholecystitis which could be assessed further with HIDA scan and/or right upper quadrant ultrasound if indicated. Pancreas: Unremarkable. No pancreatic ductal dilatation or surrounding inflammatory changes. Spleen: Normal in size without focal abnormality. Adrenals/Urinary Tract: Normal adrenal glands. No hydronephrosis. No renal parenchymal lesions. Nonobstructing 3 mm stones right kidney midpole. Stomach/Bowel: Stomach is within normal limits. Appendix appears normal. No evidence of bowel wall thickening, distention, or inflammatory changes. Vascular/Lymphatic: No significant vascular findings are present. No enlarged abdominal or pelvic lymph nodes. Reproductive: Uterus and bilateral adnexa are unremarkable. Other: No abdominal wall hernia or abnormality. No abdominopelvic ascites. Musculoskeletal: No acute or significant osseous findings. IMPRESSION: 1. Distended gallbladder with possible pericholecystic inflammatory changes. Consider possible cholecystitis. 2. Two right kidney 3 mm stones.  No hydronephrosis. 3. Otherwise no acute abdominal or pelvic pathology identified. Electronically Signed   By: Sammie Bench [name]   On: 10/20/2022 16:29    Labs:  Recent Labs    10/31/22 1533 11/01/22 0919  WBC 10.9* 11.7*  HGB 10.6* 10.5*  HCT 36.3 35.4*  PLT 403 395   Recent Labs    10/31/22 1533 11/01/22 0919  NA 142 137  K 4.3 3.7  CL 103 102  CO2 23 24  GLUCOSE 93 123*  BUN 8 6  CREATININE 0.68 0.71  CALCIUM 9.2 8.7*  Recent Labs    11/01/22 0919  PROT 7.8  ALBUMIN 3.4*  AST 21  ALT 14  ALKPHOS 75  BILITOT 1.4*   No results for input(s): "HEPBSAG", "HCVAB", "HEPAIGM", "HEPBIGM" in the last 72 hours. No results for input(s): "LABPROT", "INR" in the last 72 hours.  Past Medical History:  Diagnosis  Date   Nephrolithiasis 10/20/2022    Past Surgical History:  Procedure Laterality Date   CESAREAN SECTION     INCISION AND DRAINAGE ABSCESS  02/15/2007   pubic abscess - I&D Dr Marlou Starks   TUBAL LIGATION  09/16/2005   Cheri Fowler, MD    Family History  Problem Relation Age of Onset   Healthy Mother    Heart disease Father        MI   Diabetes Father    Stroke Father     Prior to Admission medications   Medication Sig Start Date End Date Taking? Authorizing Provider  clobetasol cream (TEMOVATE) 1.61 % Apply 1 application topically 2 (two) times daily. 07/07/22  Yes   Cyclobenzaprine HCl (FLEXERIL PO) Take 1 tablet by mouth daily as needed (pain).   Yes [provider]  glucosamine-chondroitin 500-400 MG tablet Take 1 tablet by mouth daily.   Yes [provider]  loratadine (CLARITIN) 10 MG tablet Take 10 mg by mouth daily.   Yes [provider]  ondansetron (ZOFRAN) 4 MG tablet Take 1 tablet (4 mg total) by mouth every 8 (eight) hours as needed for nausea or vomiting. Patient not taking: Reported on 11/01/2022 10/31/22   Sharion Balloon, FNP    Current Facility-Administered Medications  Medication Dose Route Frequency Provider Last Rate Last Admin   fentaNYL (SUBLIMAZE) injection 50 mcg  50 mcg Intravenous Once Rancour, Stephen, MD       Current Outpatient Medications  Medication Sig Dispense Refill   clobetasol cream (TEMOVATE) 0.96 % Apply 1 application topically 2 (two) times daily. 45 g 1   Cyclobenzaprine HCl (FLEXERIL PO) Take 1 tablet by mouth daily as needed (pain).     glucosamine-chondroitin 500-400 MG tablet Take 1 tablet by mouth daily.     loratadine (CLARITIN) 10 MG tablet Take 10 mg by mouth daily.     ondansetron (ZOFRAN) 4 MG tablet Take 1 tablet (4 mg total) by mouth every 8 (eight) hours as needed for nausea or vomiting. (Patient not taking: Reported on 11/01/2022) 60 tablet 0    Allergies as of 11/01/2022   (No Known Allergies)     Social History   Socioeconomic History   Marital status: Single    Spouse name: Not on file   Number of children: Not on file   Years of education: Not on file   Highest education level: Not on file  Occupational History   Not on file  Tobacco Use   Smoking status: Never   Smokeless tobacco: Never  Vaping Use   Vaping Use: Never used  Substance and Sexual Activity   Alcohol use: No   Drug use: No   Sexual activity: Not on file  Other Topics Concern   Not on file  Social History Narrative   Not on file   Social Determinants of Health   Financial Resource Strain: Not on file  Food Insecurity: No Food Insecurity (10/20/2022)   Hunger Vital Sign    Worried About Running Out of Food in the Last Year: Never true    Ran Out of Food in the Last Year: Never true  Transportation Needs: No Transportation Needs (10/20/2022)   PRAPARE - Hydrologist (Medical): No    Lack of Transportation (Non-Medical): No  Physical Activity: Not on file  Stress: Not on file  Social Connections: Not on file  Intimate Partner Violence: Not At Risk (10/20/2022)   Humiliation, Afraid, Rape, and Kick questionnaire    Fear of Current or Ex-Partner: No    Emotionally Abused: No    Physically Abused: No    Sexually Abused: No    Review of Systems: All systems reviewed and negative except where noted in HPI.  Physical Exam: Vital signs in last 24 hours: Temp:  [97.6 F (36.4 C)-97.8 F (36.6 C)] 97.8 F (36.6 C) (02/06 1411) Pulse Rate:  [80-99] 80 (02/06 1315) Resp:  [18-26] 26 (02/06 1315) BP: (118-148)/(64-78) 118/67 (02/06 1315) SpO2:  [95 %-100 %] 99 % (02/06 1315) Weight:  [132.1 kg] 132.1 kg (02/06 0912)    General:  Pleasant obese female in NAD Psych:  Cooperative. Normal mood and affect Eyes: Pupils equal Ears:  Normal auditory acuity Nose: No deformity, discharge or lesions Neck:  Supple, no masses felt Lungs:  Clear to auscultation.  Heart:   Regular rate, regular rhythm.  Abdomen:  Soft, nondistended, moderate mid upper abdominal tenderness. Active bowel sounds, no masses felt Rectal :  Deferred Msk: Symmetrical without gross deformities.  Neurologic:  Alert, oriented, grossly normal neurologically Extremities : No edema Skin:  Intact without significant lesions.    Intake/Output from previous day: No intake/output data recorded. Intake/Output this shift:  Total I/O In: 1000 [IV Piggyback:1000] Out: -     Active Problems:   * No active hospital problems. Tye Savoy, NP-C @  11/01/2022, 2:50 PM

## 2022-11-01 NOTE — Consult Note (Addendum)
Attending physician's note   I have taken a history, reviewed the chart, and examined the patient. I performed a substantive portion of this encounter, including complete performance of at least one of the key components, in conjunction with the APP. I agree with the APP's note, impression, and recommendations with my edits.   52 year old female with medical history as outlined below presents with acute recurrence of epigastric > LUQ pain and nausea, fever.  Recent admission 1/25-26 for similar symptoms, although reports this pain is more intense.  On that admission, had suspicion for cholecystitis on CT, but ultrasound without GB wall thickening and subsequent HIDA was negative and recommended by surgery for outpatient follow-up.  Now with acute recurrence of pain.  ER evaluation notable for the following: - Albumin 3.4, T. bili 1.4 (and downtrending) otherwise normal liver enzymes - WBC 11.7, H/H10.5/35 with MCV/RDW 71/18 - Lipase normal, troponin negative - Elevated D-dimer but subsequent CT PE was negative for PE - RUQ [place]: GB sludge, CBD 4.5 mm, mildly nodular liver contour - CT A/P: 4 abdominal mesenteric adenopathy which is new from CT in 1/25.  Splenomegaly  1) Epigastric/LUQ pain 2) Nausea 3) Mesenteric adenopathy on CT CT would be suggestive of infectious etiology, although interesting that the CT findings are new and had very similar symptoms at the end of January - Plan for expedited workup with EGD tomorrow - Pain control and antiemetics per primary service  4) Splenomegaly - No other features of impaired hepatic synthetic function on serologies.  Will check INR - Evaluate for esophageal varices, PHG, etc. time EGD as above  5) Microcytic anemia - Check iron studies - Will need outpatient colonoscopy for routine screening and evaluation of anemia if found to have have iron efficiency  The indications, risks, and benefits of EGD were explained to the patient in detail.  Risks include but are not limited to bleeding, perforation, adverse reaction to medications, and cardiopulmonary compromise. Sequelae include but are not limited to the possibility of surgery, hospitalization, and mortality. The patient verbalized understanding and wished to proceed. All questions answered      Gerrit Heck, DO, FACG [phone] office                                                     Consultation Note   Referring Provider:  Family Medicine PCP: Sharion Balloon, FNP Primary Gastroenterologist: Althia Forts        Reason for consultation: abdominal pain, N/V, anemia   Hospital Day: 1  Assessment    # 52 yo female with mid upper abdominal pain.  Admitted late January with mid upper abdominal pain, mildly elevated bilirubin and initial concern for acute cholecystitis but HIDA was negative, symptoms improved and surgery recommended outpatient follow up.  She has ongoing / worsening mid upper abdominal pain and development of fever this am. Tbilirubin trending down. Some N/V but not predominant symptom. Repeat imaging today suggests probable gallbladder sludge, new upper abdominal transverse mesenteric fat stranding and upper abdominal mesenteric adenopathy.   # Splenomegaly and ? Cirrhosis on [place]. Platelets normal at 395.   # Microcytic anemia, possibly chronic and of unclear etiology. Hgb is at her baseline of 10.4. No overt GI bleeding. No prior workup.   # See PMH for additional medical problems   Plan  Iron studies.  Will arrange for EGD to be done in am. Depending on clinical course she may be able to go home afterwards.  She got a dose of SQ lovenox this afternoon which should be fine for am EGD. Next dose wouldn't be due until tomorrow afternoon after time of EGD and we can hold that if warranted  Outpatient colonoscopy for IDA Obtain INR. Further evaluation of splenomegaly / ? Cirrhosis findings on [place] as outpatient.   HPI   Patient is a  52 yo  female with a PMH of nephrolithiasis, asthma, hepatic steatosis, obesity .   See PMH for any additional medical problems.  Linda Wilson was hospitalized the end of January with RUQ pain, N/V. Tbili was 2.1, alk phos normal, AST minimally elevated at 47 and ALT normal. WBC was 13K. She had microcytic anemia with hgb of 10.8. CT scan suggested possible cholecystitis. US showed mild sludge in gallbladder but no stones or gb wall thickening. Surgery evaluated. HIDA was negative. Outpatient follow up recommended.   Linda Wilson saw her PCP yesterday with persistent abdominal pain. Told to come to ED if pain got worse. The pain escalated earlier this am and she spiked a  fever of 102. The mid upper abdominal pain has been intermittent since it started late last month. The pain doesn't radiate through to her back. It has no relationship to eating though her appetite has been diminished. She isn't really having any significant N/V. She hasn't been taking any NSAIDs. Prior to late last month she had never had this type of pain before. She has taken Tums for the pain but they don't really help. She hasn't tried anything else to relieve the pain. No lower GI complaints. Bowel movements are normal. No black stools, no blood in stools. She doesn't feel well but is worried about missing work.   Summary of work up thus far:  VSS, afebrile Mild leukocytosis Microcytic anemia -Hgb 10.6, MCV 71 Mild hyperbilirubinemia - tbili of 1.4.   RUQ [place] -  mildly nodular contour.  The findings suggest cirrhosis and superimposed hepatic steatosis.Mild sludge in an otherwise normal gallbladder. CTA chest- No evidence of PE, aneurysm or dissection. CT AP with contrast-  Upper abdominal transverse mesenteric fat stranding which can be seen with a nonspecific inflammatory process.Marland Kitchen Splenomegaly. Upper abdominal mesenteric adenopathy.  Previous GI Evaluation     Recent Labs and Imaging CT Angio Chest PE W and/or Wo Contrast  Result Date:  11/01/2022 CLINICAL DATA:  Pulmonary embolism (PE) suspected, low to intermediate prob, positive D-dimer EXAM: CT ANGIOGRAPHY CHEST WITH CONTRAST TECHNIQUE: Multidetector CT imaging of the chest was performed using the standard protocol during bolus administration of intravenous contrast. Multiplanar CT image reconstructions and MIPs were obtained to evaluate the vascular anatomy. RADIATION DOSE REDUCTION: This exam was performed according to the departmental dose-optimization program which includes automated exposure control, adjustment of the mA and/or kV according to patient size and/or use of iterative reconstruction technique. CONTRAST:  75 mL Omnipaque 350non-ionic IV contrast. COMPARISON:  None Available. FINDINGS: Cardiovascular: Satisfactory opacification of the pulmonary arteries to the segmental level. No evidence of pulmonary embolism. Normal heart size. No pericardial effusion. Mediastinum/Nodes: No enlarged mediastinal, hilar, or axillary lymph nodes. Thyroid gland, trachea, and esophagus demonstrate no significant findings. Lungs/Pleura: Lungs are clear. No pleural effusion or pneumothorax. Musculoskeletal: No chest wall abnormality. No acute or significant osseous findings. There are thoracic degenerative changes. Review of the MIP images confirms the above findings. IMPRESSION: No evidence of  PE, aneurysm or dissection. Electronically Signed   By: Sammie Bench [name]   On: 11/01/2022 14:10   CT ABDOMEN PELVIS W CONTRAST  Result Date: 11/01/2022 CLINICAL DATA:  Abdominal pain EXAM: CT ABDOMEN AND PELVIS WITH CONTRAST TECHNIQUE: Multidetector CT imaging of the abdomen and pelvis was performed using the standard protocol following bolus administration of intravenous contrast. RADIATION DOSE REDUCTION: This exam was performed according to the departmental dose-optimization program which includes automated exposure control, adjustment of the mA and/or kV according to patient size and/or use of  iterative reconstruction technique. CONTRAST:  75 mL OMNIPAQUE IOHEXOL 350 MG/ML SOLN COMPARISON:  None Available. FINDINGS: Lower chest: No acute abnormality. Hepatobiliary: Subcentimeter hypodensity inferior right lobe of liver too small to characterize on CT scan. There was no intrahepatic ductal dilatation. No gallstones, gallbladder wall thickening, or extrahepatic biliary dilatation. Pancreas: Unremarkable. No pancreatic ductal dilatation or surrounding inflammatory changes. Spleen: Splenomegaly noted, 18.6 cm Adrenals/Urinary Tract: Adrenal glands are unremarkable. Kidneys are normal, without renal calculi, focal lesion, or hydronephrosis. Bladder is unremarkable. Stomach/Bowel: Stomach is within normal limits. Appendix appears normal. No evidence of bowel wall thickening, distention, or inflammatory changes. Vascular/Lymphatic: Unremarkable appearance of the aorta and IVC. Retro aortic left renal vein noted. There is adenopathy identified in the upper abdomen mesentery with portacaval node measuring 2.4 cm. There is some fat stranding noted in the transverse mesentery which is a nonspecific finding that can be seen with inflammation. Reproductive: Uterus and bilateral adnexa are unremarkable. Other: No abdominal wall hernia or abnormality. No abdominopelvic ascites. Musculoskeletal: No acute or significant osseous findings. IMPRESSION: 1. Upper abdominal transverse mesenteric fat stranding which can be seen with a nonspecific inflammatory process. 2. Splenomegaly. 3. Upper abdominal mesenteric adenopathy. Electronically Signed   By: Sammie Bench [name]   On: 11/01/2022 14:06   US Abdomen Limited RUQ (LIVER/GB)  Result Date: 11/01/2022 CLINICAL DATA:  Right upper quadrant pain since last night EXAM: ULTRASOUND ABDOMEN LIMITED RIGHT UPPER QUADRANT COMPARISON:  None Available. FINDINGS: Gallbladder: Mild sludge in the gallbladder. The gallbladder is otherwise normal in appearance. Common bile duct:  Diameter: 4.5 mm on limited images. Liver: Diffuse increased echogenicity. Mildly nodular contour. No focal mass. Portal vein is patent on color Doppler imaging with normal direction of blood flow towards the liver. Other: None. IMPRESSION: 1. Increased heterogeneous echogenicity in the liver and a mildly nodular contour. The contour also appears mildly nodular on the CT scan dated October 20, 2022. The findings suggest cirrhosis and superimposed hepatic steatosis. 2. Mild sludge in an otherwise normal gallbladder. Electronically Signed   By: Dorise Bullion III [name]   On: 11/01/2022 10:51   DG Chest 2 View  Result Date: 11/01/2022 CLINICAL DATA:  Pain. EXAM: CHEST - 2 VIEW COMPARISON:  December 22, 2021 FINDINGS: The heart size and mediastinal contours are within normal limits. Both lungs are clear. The visualized skeletal structures are unremarkable. IMPRESSION: No active cardiopulmonary disease. Electronically Signed   By: Dorise Bullion III [name]   On: 11/01/2022 09:44   NM Hepatobiliary Liver Func  Result Date: 10/21/2022 CLINICAL DATA:  Right upper quadrant abdominal pain. EXAM: NUCLEAR MEDICINE HEPATOBILIARY IMAGING TECHNIQUE: Sequential images of the abdomen were obtained out to 60 minutes following intravenous administration of radiopharmaceutical. RADIOPHARMACEUTICALS:  5.0 mCi Tc-72m Choletec IV COMPARISON:  Ultrasound October 20, 2022 FINDINGS: Prompt uptake and biliary excretion of activity by the liver is seen. Gallbladder activity is visualized, consistent with patency of cystic duct. Biliary activity passes into  small bowel, consistent with patent common bile duct. IMPRESSION: Patent cystic and common bile ducts. No evidence of acute cholecystitis Electronically Signed   By: Dahlia Bailiff [name]   On: 10/21/2022 12:10   US Abdomen Limited RUQ (LIVER/GB)  Result Date: 10/20/2022 CLINICAL DATA:  Epigastric pain for 2 days with nausea and vomiting. Stranding along the porta hepatis on CT scan  earlier today. EXAM: ULTRASOUND ABDOMEN LIMITED RIGHT UPPER QUADRANT COMPARISON:  CT scan 10/20/2022 FINDINGS: Gallbladder: Sonographic Murphy sign present. There is some mild sludge in the gallbladder without gallstones or gallbladder wall thickening. No overt pericholecystic fluid. Common bile duct: Not well seen on ultrasound, but of normal caliber on the CT scan earlier today. Liver: Diffuse hepatic accentuated echogenicity and reduced sonic penetration compatible with hepatic steatosis. No focal liver lesion is identified. Portal vein is patent on color Doppler imaging with normal direction of blood flow towards the liver. Other: None. IMPRESSION: 1. Sonographic Murphy sign present. There is some mild sludge in the gallbladder without gallstones or gallbladder wall thickening. No overt pericholecystic fluid, although there was stranding adjacent to parts of the gallbladder and porta hepatis on recent CT which can be seen in the setting of cholecystitis. Correlate clinically in management. 2. Hepatic steatosis. Electronically Signed   By: Van Clines [name]   On: 10/20/2022 17:49   CT ABDOMEN PELVIS W CONTRAST  Result Date: 10/20/2022 CLINICAL DATA:  Abdominal pain EXAM: CT ABDOMEN AND PELVIS WITH CONTRAST TECHNIQUE: Multidetector CT imaging of the abdomen and pelvis was performed using the standard protocol following bolus administration of intravenous contrast. RADIATION DOSE REDUCTION: This exam was performed according to the departmental dose-optimization program which includes automated exposure control, adjustment of the mA and/or kV according to patient size and/or use of iterative reconstruction technique. CONTRAST:  125 mL OMNIPAQUE IOHEXOL 300 MG/ML  SOLN COMPARISON:  None Available. FINDINGS: Lower chest: No acute abnormality. Hepatobiliary: Subcentimeter fat attenuation nodule in the right lobe of the liver posteriorly not likely represent a clinically significant finding. 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 No intrahepatic ductal dilatation identified. Gallbladder is distended and there is some stranding in the pericholecystic fat. These findings suggest possible cholecystitis which could be assessed further with HIDA scan and/or right upper quadrant ultrasound if indicated. Pancreas: Unremarkable. No pancreatic ductal dilatation or surrounding inflammatory changes. Spleen: Normal in size without focal abnormality. Adrenals/Urinary Tract: Normal adrenal glands. No hydronephrosis. No renal parenchymal lesions. Nonobstructing 3 mm stones right kidney midpole. Stomach/Bowel: Stomach is within normal limits. Appendix appears normal. No evidence of bowel wall thickening, distention, or inflammatory changes. Vascular/Lymphatic: No significant vascular findings are present. No enlarged abdominal or pelvic lymph nodes. Reproductive: Uterus and bilateral adnexa are unremarkable. Other: No abdominal wall hernia or abnormality. No abdominopelvic ascites. Musculoskeletal: No acute or significant osseous findings. IMPRESSION: 1. Distended gallbladder with possible pericholecystic inflammatory changes. Consider possible cholecystitis. 2. Two right kidney 3 mm stones.  No hydronephrosis. 3. Otherwise no acute abdominal or pelvic pathology identified. Electronically Signed   By: Sammie Bench [name]   On: 10/20/2022 16:29    Labs:  Recent Labs    10/31/22 1533 11/01/22 0919  WBC 10.9* 11.7*  HGB 10.6* 10.5*  HCT 36.3 35.4*  PLT 403 395   Recent Labs    10/31/22 1533 11/01/22 0919  NA 142 137  K 4.3 3.7  CL 103 102  CO2 23 24  GLUCOSE 93 123*  BUN 8 6  CREATININE 0.68 0.71  CALCIUM 9.2 8.7*  Recent Labs    11/01/22 0919  PROT 7.8  ALBUMIN 3.4*  AST 21  ALT 14  ALKPHOS 75  BILITOT 1.4*   No results for input(s): "HEPBSAG", "HCVAB", "HEPAIGM", "HEPBIGM" in the last 72 hours. No results for input(s): "LABPROT", "INR" in the last 72 hours.  Past Medical History:  Diagnosis  Date   Nephrolithiasis 10/20/2022    Past Surgical History:  Procedure Laterality Date   CESAREAN SECTION     INCISION AND DRAINAGE ABSCESS  02/15/2007   pubic abscess - I&D Dr Marlou Starks   TUBAL LIGATION  09/16/2005   Cheri Fowler, MD    Family History  Problem Relation Age of Onset   Healthy Mother    Heart disease Father        MI   Diabetes Father    Stroke Father     Prior to Admission medications   Medication Sig Start Date End Date Taking? Authorizing Provider  clobetasol cream (TEMOVATE) 3.53 % Apply 1 application topically 2 (two) times daily. 07/07/22  Yes   Cyclobenzaprine HCl (FLEXERIL PO) Take 1 tablet by mouth daily as needed (pain).   Yes [provider]  glucosamine-chondroitin 500-400 MG tablet Take 1 tablet by mouth daily.   Yes [provider]  loratadine (CLARITIN) 10 MG tablet Take 10 mg by mouth daily.   Yes [provider]  ondansetron (ZOFRAN) 4 MG tablet Take 1 tablet (4 mg total) by mouth every 8 (eight) hours as needed for nausea or vomiting. Patient not taking: Reported on 11/01/2022 10/31/22   Sharion Balloon, FNP    Current Facility-Administered Medications  Medication Dose Route Frequency Provider Last Rate Last Admin   fentaNYL (SUBLIMAZE) injection 50 mcg  50 mcg Intravenous Once Rancour, Stephen, MD       Current Outpatient Medications  Medication Sig Dispense Refill   clobetasol cream (TEMOVATE) 2.99 % Apply 1 application topically 2 (two) times daily. 45 g 1   Cyclobenzaprine HCl (FLEXERIL PO) Take 1 tablet by mouth daily as needed (pain).     glucosamine-chondroitin 500-400 MG tablet Take 1 tablet by mouth daily.     loratadine (CLARITIN) 10 MG tablet Take 10 mg by mouth daily.     ondansetron (ZOFRAN) 4 MG tablet Take 1 tablet (4 mg total) by mouth every 8 (eight) hours as needed for nausea or vomiting. (Patient not taking: Reported on 11/01/2022) 60 tablet 0    Allergies as of 11/01/2022   (No Known Allergies)     Social History   Socioeconomic History   Marital status: Single    Spouse name: Not on file   Number of children: Not on file   Years of education: Not on file   Highest education level: Not on file  Occupational History   Not on file  Tobacco Use   Smoking status: Never   Smokeless tobacco: Never  Vaping Use   Vaping Use: Never used  Substance and Sexual Activity   Alcohol use: No   Drug use: No   Sexual activity: Not on file  Other Topics Concern   Not on file  Social History Narrative   Not on file   Social Determinants of Health   Financial Resource Strain: Not on file  Food Insecurity: No Food Insecurity (10/20/2022)   Hunger Vital Sign    Worried About Running Out of Food in the Last Year: Never true    Ran Out of Food in the Last Year: Never true  Transportation Needs: No Transportation Needs (10/20/2022)   PRAPARE - Hydrologist (Medical): No    Lack of Transportation (Non-Medical): No  Physical Activity: Not on file  Stress: Not on file  Social Connections: Not on file  Intimate Partner Violence: Not At Risk (10/20/2022)   Humiliation, Afraid, Rape, and Kick questionnaire    Fear of Current or Ex-Partner: No    Emotionally Abused: No    Physically Abused: No    Sexually Abused: No    Review of Systems: All systems reviewed and negative except where noted in HPI.  Physical Exam: Vital signs in last 24 hours: Temp:  [97.6 F (36.4 C)-97.8 F (36.6 C)] 97.8 F (36.6 C) (02/06 1411) Pulse Rate:  [80-99] 80 (02/06 1315) Resp:  [18-26] 26 (02/06 1315) BP: (118-148)/(64-78) 118/67 (02/06 1315) SpO2:  [95 %-100 %] 99 % (02/06 1315) Weight:  [132.1 kg] 132.1 kg (02/06 0912)    General:  Pleasant obese female in NAD Psych:  Cooperative. Normal mood and affect Eyes: Pupils equal Ears:  Normal auditory acuity Nose: No deformity, discharge or lesions Neck:  Supple, no masses felt Lungs:  Clear to auscultation.  Heart:   Regular rate, regular rhythm.  Abdomen:  Soft, nondistended, moderate mid upper abdominal tenderness. Active bowel sounds, no masses felt Rectal :  Deferred Msk: Symmetrical without gross deformities.  Neurologic:  Alert, oriented, grossly normal neurologically Extremities : No edema Skin:  Intact without significant lesions.    Intake/Output from previous day: No intake/output data recorded. Intake/Output this shift:  Total I/O In: 1000 [IV Piggyback:1000] Out: -     Active Problems:   * No active hospital problems. Tye Savoy, NP-C @  11/01/2022, 2:50 PM

## 2022-11-01 NOTE — Addendum Note (Signed)
Addended by: Evelina Dun A on: 11/01/2022 01:05 PM   Modules accepted: Level of Service

## 2022-11-01 NOTE — H&P (Signed)
History and Physical   Sonal L Campi KPT:[id number] DOB: [birth year] DOA: 11/01/2022  PCP: Sharion Balloon, FNP   Patient coming from: Home  Chief Complaint: Abdominal pain  HPI: Linda Wilson is a 52 y.o. female with medical history significant of obesity, anemia, recent cholecystitis presenting with bilateral abdominal pain radiating to her chest.  Patient reports ongoing bilateral upper quadrant pain with intermittent radiation to her chest.  Has had issues with this for several weeks and was admitted January 25-2:26 days of pain that was 10 out of 10 in the right upper quadrant and found to have a distended gallbladder on CT with evidence of cholecystitis on ultrasound.  Surgery was consulted and patient had a HIDA scan which looked okay, diet was advanced and patient was discharged for outpatient follow-up.  She has had ongoing intermittent significant abdominal pain with intermittent radiation to the chest as above.  She states fever last night to 102 on home infrared thermometer on forehead; And some urinary frequency.  Around 1:30 AM this morning she had severe pain worse than she had had in the last few weeks.  Denies chills, shortness of breath, constipation, diarrhea, nausea, vomiting.  ED Course: Vital signs in ED significant for blood pressure in the 170Y to 174 systolic.  Lab workup included CMP with glucose 123, calcium 8.7, albumin 3.4, T. bili 1.4.  CBC with leukocytosis to 11.7, hemoglobin stable at 10.5.  Troponin negative x 2.  D-dimer elevated at 3.77.  Lipase normal.  Respiratory panel for flu COVID and RSV pending.  Urinalysis normal.  Chest x-ray showed no acute normality.  CT PE study was negative for PE, aneurysm, dissection.  Ultrasound of the abdomen showed increased echogenicity of the liver and nodular contour suggestive of cirrhosis/steatosis, other than gallbladder sludge gallbladder appeared normal.  CT of the abdomen pelvis showed upper abdominal transverse  mesenteric fat stranding, representing nonspecific inflammation.  Also noted with splenomegaly.  Patient received fentanyl, Zofran, PPI, liter fluids in the ED.  General surgery consulted and stated there is no need for surgical intervention currently, recommend considering repeat HIDA scan if symptoms fail to improve.  GI consulted and plan for endoscopy tomorrow.  Review of Systems: As per HPI otherwise all other systems reviewed and are negative.  Past Medical History:  Diagnosis Date   Nephrolithiasis 10/20/2022    Past Surgical History:  Procedure Laterality Date   CESAREAN SECTION     INCISION AND DRAINAGE ABSCESS  02/15/2007   pubic abscess - I&D Dr Marlou Starks   TUBAL LIGATION  09/16/2005   Cheri Fowler, MD    Social History  reports that she has never smoked. She has never used smokeless tobacco. She reports that she does not drink alcohol and does not use drugs.  No Known Allergies  Family History  Problem Relation Age of Onset   Healthy Mother    Heart disease Father        MI   Diabetes Father    Stroke Father   Reviewed on admission  Prior to Admission medications   Medication Sig Start Date End Date Taking? Authorizing Provider  clobetasol cream (TEMOVATE) 9.44 % Apply 1 application topically 2 (two) times daily. 07/07/22  Yes   Cyclobenzaprine HCl (FLEXERIL PO) Take 1 tablet by mouth daily as needed (pain).   Yes [provider]  glucosamine-chondroitin 500-400 MG tablet Take 1 tablet by mouth daily.   Yes [provider]  loratadine (CLARITIN) 10 MG tablet Take 10  mg by mouth daily.   Yes [provider]  ondansetron (ZOFRAN) 4 MG tablet Take 1 tablet (4 mg total) by mouth every 8 (eight) hours as needed for nausea or vomiting. Patient not taking: Reported on 11/01/2022 10/31/22   Sharion Balloon, FNP    Physical Exam: Vitals:   11/01/22 1411 11/01/22 1455 11/01/22 1455 11/01/22 1500  BP:  121/67  124/67  Pulse:  84  81  Resp:  15  14   Temp: 97.8 F (36.6 C)  98.3 F (36.8 C)   TempSrc: Oral  Oral   SpO2:  100%  100%  Weight:      Height:        Physical Exam Constitutional:      General: She is not in acute distress.    Appearance: Normal appearance.  HENT:     Head: Normocephalic and atraumatic.     Mouth/Throat:     Mouth: Mucous membranes are moist.     Pharynx: Oropharynx is clear.  Eyes:     Extraocular Movements: Extraocular movements intact.     Pupils: Pupils are equal, round, and reactive to light.  Cardiovascular:     Rate and Rhythm: Normal rate and regular rhythm.     Pulses: Normal pulses.     Heart sounds: Normal heart sounds.  Pulmonary:     Effort: Pulmonary effort is normal. No respiratory distress.     Breath sounds: Normal breath sounds.  Abdominal:     General: Bowel sounds are normal. There is no distension.     Palpations: Abdomen is soft.     Tenderness: There is abdominal tenderness in the right upper quadrant, epigastric area and left upper quadrant.  Musculoskeletal:        General: No swelling or deformity.  Skin:    General: Skin is warm and dry.  Neurological:     General: No focal deficit present.     Mental Status: Mental status is at baseline.    Labs on Admission: I have personally reviewed following labs and imaging studies  CBC: Recent Labs  Lab 10/31/22 1533 11/01/22 0919  WBC 10.9* 11.7*  NEUTROABS 6.8 7.7  HGB 10.6* 10.5*  HCT 36.3 35.4*  MCV 71* 71.7*  PLT 403 782    Basic Metabolic Panel: Recent Labs  Lab 10/31/22 1533 11/01/22 0919  NA 142 137  K 4.3 3.7  CL 103 102  CO2 23 24  GLUCOSE 93 123*  BUN 8 6  CREATININE 0.68 0.71  CALCIUM 9.2 8.7*    GFR: Estimated Creatinine Clearance: 121.6 mL/min (by C-G formula based on SCr of 0.71 mg/dL).  Liver Function Tests: Recent Labs  Lab 11/01/22 0919  AST 21  ALT 14  ALKPHOS 75  BILITOT 1.4*  PROT 7.8  ALBUMIN 3.4*    Urine analysis:    Component Value Date/Time   COLORURINE  AMBER (A) 11/01/2022 1012   APPEARANCEUR HAZY (A) 11/01/2022 1012   LABSPEC 1.019 11/01/2022 1012   PHURINE 6.0 11/01/2022 1012   GLUCOSEU NEGATIVE 11/01/2022 1012   HGBUR NEGATIVE 11/01/2022 1012   BILIRUBINUR NEGATIVE 11/01/2022 1012   KETONESUR NEGATIVE 11/01/2022 1012   PROTEINUR NEGATIVE 11/01/2022 1012   NITRITE NEGATIVE 11/01/2022 1012   LEUKOCYTESUR NEGATIVE 11/01/2022 1012    Radiological Exams on Admission: CT Angio Chest PE W and/or Wo Contrast  Result Date: 11/01/2022 CLINICAL DATA:  Pulmonary embolism (PE) suspected, low to intermediate prob, positive D-dimer EXAM: CT ANGIOGRAPHY CHEST WITH CONTRAST  TECHNIQUE: Multidetector CT imaging of the chest was performed using the standard protocol during bolus administration of intravenous contrast. Multiplanar CT image reconstructions and MIPs were obtained to evaluate the vascular anatomy. RADIATION DOSE REDUCTION: This exam was performed according to the departmental dose-optimization program which includes automated exposure control, adjustment of the mA and/or kV according to patient size and/or use of iterative reconstruction technique. CONTRAST:  75 mL Omnipaque 350non-ionic IV contrast. COMPARISON:  None Available. FINDINGS: Cardiovascular: Satisfactory opacification of the pulmonary arteries to the segmental level. No evidence of pulmonary embolism. Normal heart size. No pericardial effusion. Mediastinum/Nodes: No enlarged mediastinal, hilar, or axillary lymph nodes. Thyroid gland, trachea, and esophagus demonstrate no significant findings. Lungs/Pleura: Lungs are clear. No pleural effusion or pneumothorax. Musculoskeletal: No chest wall abnormality. No acute or significant osseous findings. There are thoracic degenerative changes. Review of the MIP images confirms the above findings. IMPRESSION: No evidence of PE, aneurysm or dissection. Electronically Signed   By: Sammie Bench [name]   On: 11/01/2022 14:10   CT ABDOMEN PELVIS W  CONTRAST  Result Date: 11/01/2022 CLINICAL DATA:  Abdominal pain EXAM: CT ABDOMEN AND PELVIS WITH CONTRAST TECHNIQUE: Multidetector CT imaging of the abdomen and pelvis was performed using the standard protocol following bolus administration of intravenous contrast. RADIATION DOSE REDUCTION: This exam was performed according to the departmental dose-optimization program which includes automated exposure control, adjustment of the mA and/or kV according to patient size and/or use of iterative reconstruction technique. CONTRAST:  75 mL OMNIPAQUE IOHEXOL 350 MG/ML SOLN COMPARISON:  None Available. FINDINGS: Lower chest: No acute abnormality. Hepatobiliary: Subcentimeter hypodensity inferior right lobe of liver too small to characterize on CT scan. There was no intrahepatic ductal dilatation. No gallstones, gallbladder wall thickening, or extrahepatic biliary dilatation. Pancreas: Unremarkable. No pancreatic ductal dilatation or surrounding inflammatory changes. Spleen: Splenomegaly noted, 18.6 cm Adrenals/Urinary Tract: Adrenal glands are unremarkable. Kidneys are normal, without renal calculi, focal lesion, or hydronephrosis. Bladder is unremarkable. Stomach/Bowel: Stomach is within normal limits. Appendix appears normal. No evidence of bowel wall thickening, distention, or inflammatory changes. Vascular/Lymphatic: Unremarkable appearance of the aorta and IVC. Retro aortic left renal vein noted. There is adenopathy identified in the upper abdomen mesentery with portacaval node measuring 2.4 cm. There is some fat stranding noted in the transverse mesentery which is a nonspecific finding that can be seen with inflammation. Reproductive: Uterus and bilateral adnexa are unremarkable. Other: No abdominal wall hernia or abnormality. No abdominopelvic ascites. Musculoskeletal: No acute or significant osseous findings. IMPRESSION: 1. Upper abdominal transverse mesenteric fat stranding which can be seen with a nonspecific  inflammatory process. 2. Splenomegaly. 3. Upper abdominal mesenteric adenopathy. Electronically Signed   By: Sammie Bench [name]   On: 11/01/2022 14:06   US Abdomen Limited RUQ (LIVER/GB)  Result Date: 11/01/2022 CLINICAL DATA:  Right upper quadrant pain since last night EXAM: ULTRASOUND ABDOMEN LIMITED RIGHT UPPER QUADRANT COMPARISON:  None Available. FINDINGS: Gallbladder: Mild sludge in the gallbladder. The gallbladder is otherwise normal in appearance. Common bile duct: Diameter: 4.5 mm on limited images. Liver: Diffuse increased echogenicity. Mildly nodular contour. No focal mass. Portal vein is patent on color Doppler imaging with normal direction of blood flow towards the liver. Other: None. IMPRESSION: 1. Increased heterogeneous echogenicity in the liver and a mildly nodular contour. The contour also appears mildly nodular on the CT scan dated October 20, 2022. The findings suggest cirrhosis and superimposed hepatic steatosis. 2. Mild sludge in an otherwise normal gallbladder. Electronically Signed  By: Dorise Bullion III [name]   On: 11/01/2022 10:51   DG Chest 2 View  Result Date: 11/01/2022 CLINICAL DATA:  Pain. EXAM: CHEST - 2 VIEW COMPARISON:  December 22, 2021 FINDINGS: The heart size and mediastinal contours are within normal limits. Both lungs are clear. The visualized skeletal structures are unremarkable. IMPRESSION: No active cardiopulmonary disease. Electronically Signed   By: Dorise Bullion III [name]   On: 11/01/2022 09:44    EKG: Independently reviewed.  Sinus rhythm at 99 bpm.  Some baseline wander.  Nonspecific T wave flattening.  Assessment/Plan Active Problems:   Obesity, Class III, BMI 40-49.9 (morbid obesity) (HCC)   Normocytic anemia   Intractable abdominal pain   Intractable abdominal pain > Patient presenting with bilateral upper quadrant pain intermittent radiation to the chest.  Ongoing for several weeks initially thought to have cholecystitis during recent admission  however HIDA scan was negative and patient was able to advance diet. > Had a much more severe episode earlier this morning and a fever to 102 last night. > Imaging with nodularity of liver suggesting cirrhosis on ultrasound and CT showing transverse mesenteric fat stranding representing nonspecific inflammation as well as splenomegaly. > General surgery consulted and saw no need for surgical intervention and recommended to consider repeat HIDA scan if symptoms persist. > Gastroenterology consulted in ED > Received fentanyl, Zofran, PPI, IV fluids in the ED - Monitor on telemetry - Appreciate GI recommendations - IV fluids overnight - Consider antibiotics if pt spikes fever  - Plan for endoscopy tomorrow - Pain control as needed   Anemia > Hemoglobin stable at 10.5 - Trend CBC  Obesity - Noted   DVT prophylaxis: Lovenox Code Status:   Full, discussed with patient Family Communication:  None on admission Disposition Plan:   Patient is from:  Home  Anticipated DC to:  Home  Anticipated DC date:  1 to 3 days  Anticipated DC barriers: None  Consults called:  General surgery consulted in the ED, gastroenterology consulted in the ED and are following. Admission status:  Observation, telemetry  Severity of Illness: The appropriate patient status for this patient is OBSERVATION. Observation status is judged to be reasonable and necessary in order to provide the required intensity of service to ensure the patient's safety. The patient's presenting symptoms, physical exam findings, and initial radiographic and laboratory data in the context of their medical condition is felt to place them at decreased risk for further clinical deterioration. Furthermore, it is anticipated that the patient will be medically stable for discharge from the hospital within 2 midnights of admission.    Marcelyn Bruins MD [facility]  How to contact the [facility] Attending or Consulting provider [place] or  covering provider during after hours [place], for this patient?   Check the care team in [facility] and look for a) attending/consulting [facility] provider listed and b) the [facility] team listed Log into [email or web address] and use [place] universal password to access. If you do not have the password, please contact the hospital operator. Locate the [facility] provider you are looking for under [facility] and page to a number that you can be directly reached. If you still have difficulty reaching the provider, please page the [facility] (Director on Call) for the Hospitalists listed on amion for assistance.  11/01/2022, 3:40 PM

## 2022-11-01 NOTE — ED Notes (Signed)
This RN noted that pt's 1st trop was collected at 0919 and received in at 80 and has not resulted, however the troponin that was collected at 1122 and received in at 1217 has resulted. Per lab, they are looking into this.

## 2022-11-01 NOTE — ED Notes (Signed)
ED TO INPATIENT HANDOFF REPORT  ED Nurse Name and Phone #: Jory Sims [phone]  S Name/Age/Gender Linda Wilson 52 y.o. female Room/Bed: 029C/029C  Code Status   Code Status: Full Code  Home/SNF/Other Home Patient oriented to: self, place, time, and situation Is this baseline? Yes   Triage Complete: Triage complete  Chief Complaint Intractable abdominal pain [R10.9]  Triage Note Pt c/o LUQ pain that radiates to left side of chest that started at 0130 today. Pt denies N/V.   Allergies No Known Allergies  Level of Care/Admitting Diagnosis ED Disposition     ED Disposition  Admit   Condition  --   Comment  Hospital Area: [facility] [[id number]]  Level of Care: Telemetry Medical [104]  May place patient in observation at [facility] or [place] if equivalent level of care is available:: No  Covid Evaluation: Asymptomatic - no recent exposure (last 10 days) testing not required  Diagnosis: Intractable abdominal pain [782423]  Admitting Physician: Marcelyn Bruins [[id number]]  Attending Physician: Marcelyn Bruins [[id number]]          B Medical/Surgery History Past Medical History:  Diagnosis Date   Nephrolithiasis 10/20/2022   Past Surgical History:  Procedure Laterality Date   CESAREAN SECTION     INCISION AND DRAINAGE ABSCESS  02/15/2007   pubic abscess - I&D Dr Marlou Starks   TUBAL LIGATION  09/16/2005   Cheri Fowler, MD     A IV Location/Drains/Wounds Patient Lines/Drains/Airways Status     Active Line/Drains/Airways     Name Placement date Placement time Site Days   Peripheral IV 11/01/22 18 G Right Antecubital 11/01/22  1115  Antecubital  less than 1            Intake/Output Last 24 hours  Intake/Output Summary (Last 24 hours) at 11/01/2022 1618 Last data filed at 11/01/2022 1219 Gross per 24 hour  Intake 1000 ml  Output --  Net 1000 ml    Labs/Imaging Results for orders placed or performed during the hospital  encounter of 11/01/22 (from the past 48 hour(s))  Comprehensive metabolic panel     Status: Abnormal   Collection Time: 11/01/22  9:19 AM  Result Value Ref Range   Sodium 137 135 - 145 mmol/L   Potassium 3.7 3.5 - 5.1 mmol/L   Chloride 102 98 - 111 mmol/L   CO2 24 22 - 32 mmol/L   Glucose, Bld 123 (H) 70 - 99 mg/dL    Comment: Glucose reference range applies only to samples taken after fasting for at least 8 hours.   BUN 6 6 - 20 mg/dL   Creatinine, Ser 0.71 0.44 - 1.00 mg/dL   Calcium 8.7 (L) 8.9 - 10.3 mg/dL   Total Protein 7.8 6.5 - 8.1 g/dL   Albumin 3.4 (L) 3.5 - 5.0 g/dL   AST 21 15 - 41 U/L   ALT 14 0 - 44 U/L   Alkaline Phosphatase 75 38 - 126 U/L   Total Bilirubin 1.4 (H) 0.3 - 1.2 mg/dL   GFR, Estimated >60 >60 mL/min    Comment: (NOTE) Calculated using the CKD-EPI Creatinine Equation (2021)    Anion gap 11 5 - 15    Comment: Performed at [facility], [place] [address] [place], [place] [address]  Lipase, blood     Status: None   Collection Time: 11/01/22  9:19 AM  Result Value Ref Range   Lipase 25 11 - 51 U/L  Comment: Performed at [facility], [place] [address], [place], [place] [address]  CBC with Diff     Status: Abnormal   Collection Time: 11/01/22  9:19 AM  Result Value Ref Range   WBC 11.7 (H) 4.0 - 10.5 K/uL   RBC 4.94 3.87 - 5.11 MIL/uL   Hemoglobin 10.5 (L) 12.0 - 15.0 g/dL   HCT 35.4 (L) 36.0 - 46.0 %   MCV 71.7 (L) 80.0 - 100.0 fL   MCH 21.3 (L) 26.0 - 34.0 pg   MCHC 29.7 (L) 30.0 - 36.0 g/dL   RDW 18.0 (H) 11.5 - 15.5 %   Platelets 395 150 - 400 K/uL   nRBC 0.0 0.0 - 0.2 %   Neutrophils Relative % 65 %   Neutro Abs 7.7 1.7 - 7.7 K/uL   Lymphocytes Relative 22 %   Lymphs Abs 2.5 0.7 - 4.0 K/uL   Monocytes Relative 10 %   Monocytes Absolute 1.2 (H) 0.1 - 1.0 K/uL   Eosinophils Relative 2 %   Eosinophils Absolute 0.2 0.0 - 0.5 K/uL   Basophils Relative 0 %   Basophils Absolute 0.1 0.0 - 0.1 K/uL   Immature Granulocytes 1 %   Abs  Immature Granulocytes 0.07 0.00 - 0.07 K/uL    Comment: Performed at [facility], [place] [address], [place], [place] [address]  Troponin I (High Sensitivity)     Status: None   Collection Time: 11/01/22  9:19 AM  Result Value Ref Range   Troponin I (High Sensitivity) 6 <18 ng/L    Comment: (NOTE) Elevated high sensitivity troponin I (hsTnI) values and significant  changes across serial measurements may suggest ACS but many other  chronic and acute conditions are known to elevate hsTnI results.  Refer to the "Links" section for chest pain algorithms and additional  guidance. Performed at [facility], [place] [address], [place], [place] [address]   Urinalysis, Routine w reflex microscopic -Urine, Clean Catch     Status: Abnormal   Collection Time: 11/01/22 10:12 AM  Result Value Ref Range   Color, Urine AMBER (A) YELLOW    Comment: BIOCHEMICALS MAY BE AFFECTED BY COLOR   APPearance HAZY (A) CLEAR   Specific Gravity, Urine 1.019 1.005 - 1.030   pH 6.0 5.0 - 8.0   Glucose, UA NEGATIVE NEGATIVE mg/dL   Hgb urine dipstick NEGATIVE NEGATIVE   Bilirubin Urine NEGATIVE NEGATIVE   Ketones, ur NEGATIVE NEGATIVE mg/dL   Protein, ur NEGATIVE NEGATIVE mg/dL   Nitrite NEGATIVE NEGATIVE   Leukocytes,Ua NEGATIVE NEGATIVE    Comment: Performed at [facility], [place] [address], [place],  [address]  D-dimer, quantitative     Status: Abnormal   Collection Time: 11/01/22 11:06 AM  Result Value Ref Range   D-Dimer, Quant 3.77 (H) 0.00 - 0.50 ug/mL-FEU    Comment: (NOTE) At the manufacturer cut-off value of 0.5 g/mL FEU, this assay has a negative predictive value of 95-100%.This assay is intended for use in conjunction with a clinical pretest probability (PTP) assessment model to exclude pulmonary embolism (PE) and deep venous thrombosis (DVT) in outpatients suspected of PE or DVT. Results should be correlated with clinical presentation. Performed at [facility],  [place] [address], [place], [place] [address]   Troponin I (High Sensitivity)     Status: None   Collection Time: 11/01/22 11:22 AM  Result Value Ref Range   Troponin I (High Sensitivity) 4 <18 ng/L  Comment: (NOTE) Elevated high sensitivity troponin I (hsTnI) values and significant  changes across serial measurements may suggest ACS but many other  chronic and acute conditions are known to elevate hsTnI results.  Refer to the "Links" section for chest pain algorithms and additional  guidance. Performed at [facility], [place] [address], [place], [place] [address]    CT Angio Chest PE W and/or Wo Contrast  Result Date: 11/01/2022 CLINICAL DATA:  Pulmonary embolism (PE) suspected, low to intermediate prob, positive D-dimer EXAM: CT ANGIOGRAPHY CHEST WITH CONTRAST TECHNIQUE: Multidetector CT imaging of the chest was performed using the standard protocol during bolus administration of intravenous contrast. Multiplanar CT image reconstructions and MIPs were obtained to evaluate the vascular anatomy. RADIATION DOSE REDUCTION: This exam was performed according to the departmental dose-optimization program which includes automated exposure control, adjustment of the mA and/or kV according to patient size and/or use of iterative reconstruction technique. CONTRAST:  75 mL Omnipaque 350non-ionic IV contrast. COMPARISON:  None Available. FINDINGS: Cardiovascular: Satisfactory opacification of the pulmonary arteries to the segmental level. No evidence of pulmonary embolism. Normal heart size. No pericardial effusion. Mediastinum/Nodes: No enlarged mediastinal, hilar, or axillary lymph nodes. Thyroid gland, trachea, and esophagus demonstrate no significant findings. Lungs/Pleura: Lungs are clear. No pleural effusion or pneumothorax. Musculoskeletal: No chest wall abnormality. No acute or significant osseous findings. There are thoracic degenerative changes. Review of the MIP images confirms the above findings.  IMPRESSION: No evidence of PE, aneurysm or dissection. Electronically Signed   By: Sammie Bench [name]   On: 11/01/2022 14:10   CT ABDOMEN PELVIS W CONTRAST  Result Date: 11/01/2022 CLINICAL DATA:  Abdominal pain EXAM: CT ABDOMEN AND PELVIS WITH CONTRAST TECHNIQUE: Multidetector CT imaging of the abdomen and pelvis was performed using the standard protocol following bolus administration of intravenous contrast. RADIATION DOSE REDUCTION: This exam was performed according to the departmental dose-optimization program which includes automated exposure control, adjustment of the mA and/or kV according to patient size and/or use of iterative reconstruction technique. CONTRAST:  75 mL OMNIPAQUE IOHEXOL 350 MG/ML SOLN COMPARISON:  None Available. FINDINGS: Lower chest: No acute abnormality. Hepatobiliary: Subcentimeter hypodensity inferior right lobe of liver too small to characterize on CT scan. There was no intrahepatic ductal dilatation. No gallstones, gallbladder wall thickening, or extrahepatic biliary dilatation. Pancreas: Unremarkable. No pancreatic ductal dilatation or surrounding inflammatory changes. Spleen: Splenomegaly noted, 18.6 cm Adrenals/Urinary Tract: Adrenal glands are unremarkable. Kidneys are normal, without renal calculi, focal lesion, or hydronephrosis. Bladder is unremarkable. Stomach/Bowel: Stomach is within normal limits. Appendix appears normal. No evidence of bowel wall thickening, distention, or inflammatory changes. Vascular/Lymphatic: Unremarkable appearance of the aorta and IVC. Retro aortic left renal vein noted. There is adenopathy identified in the upper abdomen mesentery with portacaval node measuring 2.4 cm. There is some fat stranding noted in the transverse mesentery which is a nonspecific finding that can be seen with inflammation. Reproductive: Uterus and bilateral adnexa are unremarkable. Other: No abdominal wall hernia or abnormality. No abdominopelvic ascites.  Musculoskeletal: No acute or significant osseous findings. IMPRESSION: 1. Upper abdominal transverse mesenteric fat stranding which can be seen with a nonspecific inflammatory process. 2. Splenomegaly. 3. Upper abdominal mesenteric adenopathy. Electronically Signed   By: Sammie Bench [name]   On: 11/01/2022 14:06   US Abdomen Limited RUQ (LIVER/GB)  Result Date: 11/01/2022 CLINICAL DATA:  Right upper quadrant pain since last night EXAM: ULTRASOUND ABDOMEN LIMITED RIGHT UPPER QUADRANT COMPARISON:  None Available. FINDINGS: Gallbladder: Mild sludge in  the gallbladder. The gallbladder is otherwise normal in appearance. Common bile duct: Diameter: 4.5 mm on limited images. Liver: Diffuse increased echogenicity. Mildly nodular contour. No focal mass. Portal vein is patent on color Doppler imaging with normal direction of blood flow towards the liver. Other: None. IMPRESSION: 1. Increased heterogeneous echogenicity in the liver and a mildly nodular contour. The contour also appears mildly nodular on the CT scan dated October 20, 2022. The findings suggest cirrhosis and superimposed hepatic steatosis. 2. Mild sludge in an otherwise normal gallbladder. Electronically Signed   By: Dorise Bullion III [name]   On: 11/01/2022 10:51   DG Chest 2 View  Result Date: 11/01/2022 CLINICAL DATA:  Pain. EXAM: CHEST - 2 VIEW COMPARISON:  December 22, 2021 FINDINGS: The heart size and mediastinal contours are within normal limits. Both lungs are clear. The visualized skeletal structures are unremarkable. IMPRESSION: No active cardiopulmonary disease. Electronically Signed   By: Dorise Bullion III [name]   On: 11/01/2022 09:44    Pending Labs Unresulted Labs (From admission, onward)     Start     Ordered   11/08/22 0500  Creatinine, serum  (enoxaparin (LOVENOX)    CrCl >/= 30 ml/min)  Weekly,   R     Comments: while on enoxaparin therapy    11/01/22 1540   11/02/22 0500  Comprehensive metabolic panel  Tomorrow morning,   R         11/01/22 1540   11/02/22 0500  CBC  Tomorrow morning,   R        11/01/22 1540   11/02/22 0500  Protime-INR  Tomorrow morning,   R        11/01/22 1552   11/01/22 1553  Ferritin  Add-on,   AD        11/01/22 1552   11/01/22 1553  Iron and TIBC  Add-on,   AD        11/01/22 1552   11/01/22 1542  Resp panel by RT-PCR (RSV, Flu A&B, Covid) Anterior Nasal Swab  (Tier 2 - SymptomaticResp panel by RT-PCR (RSV, Flu A&B, Covid))  Once,   R        11/01/22 1541            Vitals/Pain Today's Vitals   11/01/22 1455 11/01/22 1458 11/01/22 1500 11/01/22 1545  BP:   124/67 113/63  Pulse:   81 77  Resp:   14 14  Temp: 98.3 F (36.8 C)     TempSrc: Oral     SpO2:   100% 96%  Weight:      Height:      PainSc:  8       Isolation Precautions Airborne and Contact precautions  Medications Medications  enoxaparin (LOVENOX) injection 60 mg (has no administration in time range)  sodium chloride flush (NS) 0.9 % injection 3 mL (has no administration in time range)  acetaminophen (TYLENOL) tablet 650 mg (has no administration in time range)    Or  acetaminophen (TYLENOL) suppository 650 mg (has no administration in time range)  lactated ringers bolus 1,000 mL (0 mLs Intravenous Stopped 11/01/22 1219)  ondansetron (ZOFRAN) injection 4 mg (4 mg Intravenous Given 11/01/22 1130)  fentaNYL (SUBLIMAZE) injection 50 mcg (50 mcg Intravenous Given 11/01/22 1130)  pantoprazole (PROTONIX) injection 40 mg (40 mg Intravenous Given 11/01/22 1130)  iohexol (OMNIPAQUE) 350 MG/ML injection 75 mL (75 mLs Intravenous Contrast Given 11/01/22 1343)  fentaNYL (SUBLIMAZE) injection 50 mcg (50 mcg Intravenous Given 11/01/22  1459)    Mobility walks     Focused Assessments    R Recommendations: See Admitting Provider Note  Report given to:   Additional Notes: Pt is A&Ox4, ambulatory w/o any devices. VSS on RA. 18 g R AC currently SL. Respiratory swab being obtained now and will be sent to lab prior to pt  transport.

## 2022-11-01 NOTE — Care Plan (Signed)
SBAR noted. Sreece, RN

## 2022-11-01 NOTE — ED Notes (Signed)
GI at bedside

## 2022-11-02 ENCOUNTER — Encounter (HOSPITAL_COMMUNITY): Payer: Self-pay | Admitting: Internal Medicine

## 2022-11-02 ENCOUNTER — Observation Stay (HOSPITAL_BASED_OUTPATIENT_CLINIC_OR_DEPARTMENT_OTHER): Payer: Commercial Managed Care - PPO | Admitting: Certified Registered Nurse Anesthetist

## 2022-11-02 ENCOUNTER — Other Ambulatory Visit: Payer: Self-pay

## 2022-11-02 ENCOUNTER — Encounter (HOSPITAL_COMMUNITY)
Admission: EM | Disposition: A | Discharge status: Home / Self Care | Payer: Self-pay | Source: Home / Self Care | Attending: Emergency Medicine

## 2022-11-02 ENCOUNTER — Observation Stay (HOSPITAL_COMMUNITY): Payer: Commercial Managed Care - PPO | Admitting: Certified Registered Nurse Anesthetist

## 2022-11-02 ENCOUNTER — Telehealth: Payer: Self-pay

## 2022-11-02 DIAGNOSIS — R109 Unspecified abdominal pain: Secondary | ICD-10-CM

## 2022-11-02 DIAGNOSIS — R933 Abnormal findings on diagnostic imaging of other parts of digestive tract: Secondary | ICD-10-CM

## 2022-11-02 DIAGNOSIS — D509 Iron deficiency anemia, unspecified: Secondary | ICD-10-CM | POA: Diagnosis not present

## 2022-11-02 DIAGNOSIS — R1010 Upper abdominal pain, unspecified: Secondary | ICD-10-CM | POA: Diagnosis not present

## 2022-11-02 DIAGNOSIS — D649 Anemia, unspecified: Secondary | ICD-10-CM

## 2022-11-02 DIAGNOSIS — R110 Nausea: Secondary | ICD-10-CM

## 2022-11-02 DIAGNOSIS — M1990 Unspecified osteoarthritis, unspecified site: Secondary | ICD-10-CM | POA: Diagnosis not present

## 2022-11-02 DIAGNOSIS — Z6841 Body Mass Index (BMI) 40.0 and over, adult: Secondary | ICD-10-CM

## 2022-11-02 HISTORY — PX: ESOPHAGOGASTRODUODENOSCOPY (EGD) WITH PROPOFOL: SHX5813

## 2022-11-02 HISTORY — PX: BIOPSY: SHX5522

## 2022-11-02 LAB — COMPREHENSIVE METABOLIC PANEL
ALT: 12 U/L (ref 0–44)
AST: 21 U/L (ref 15–41)
Albumin: 2.9 g/dL — ABNORMAL LOW (ref 3.5–5.0)
Alkaline Phosphatase: 58 U/L (ref 38–126)
Anion gap: 8 (ref 5–15)
BUN: <5 mg/dL — ABNORMAL LOW (ref 6–20)
CO2: 28 mmol/L (ref 22–32)
Calcium: 8.3 mg/dL — ABNORMAL LOW (ref 8.9–10.3)
Chloride: 103 mmol/L (ref 98–111)
Creatinine, Ser: 0.72 mg/dL (ref 0.44–1.00)
GFR, Estimated: >60 mL/min (ref >60)
Glucose, Bld: 94 mg/dL (ref 70–99)
Potassium: 3.7 mmol/L (ref 3.5–5.1)
Sodium: 139 mmol/L (ref 135–145)
Total Bilirubin: 0.9 mg/dL (ref 0.3–1.2)
Total Protein: 6.5 g/dL (ref 6.5–8.1)

## 2022-11-02 LAB — CBC
HCT: 30.9 % — ABNORMAL LOW (ref 36.0–46.0)
Hemoglobin: 9 g/dL — ABNORMAL LOW (ref 12.0–15.0)
MCH: 21.1 pg — ABNORMAL LOW (ref 26.0–34.0)
MCHC: 29.1 g/dL — ABNORMAL LOW (ref 30.0–36.0)
MCV: 72.4 fL — ABNORMAL LOW (ref 80.0–100.0)
Platelets: 289 10*3/uL (ref 150–400)
RBC: 4.27 MIL/uL (ref 3.87–5.11)
RDW: 17.9 % — ABNORMAL HIGH (ref 11.5–15.5)
WBC: 6.3 10*3/uL (ref 4.0–10.5)
nRBC: 0 % (ref 0.0–0.2)

## 2022-11-02 LAB — PROTIME-INR
INR: 1.2 (ref 0.8–1.2)
Prothrombin Time: 15 seconds (ref 11.4–15.2)

## 2022-11-02 SURGERY — ESOPHAGOGASTRODUODENOSCOPY (EGD) WITH PROPOFOL
Anesthesia: Monitor Anesthesia Care

## 2022-11-02 MED ORDER — PROPOFOL 500 MG/50ML IV EMUL
INTRAVENOUS | Status: DC | PRN
  Administered 2022-11-02: 150 ug/kg/min via INTRAVENOUS

## 2022-11-02 MED ORDER — SODIUM CHLORIDE 0.9 % IV SOLN: INTRAVENOUS | Status: DC

## 2022-11-02 MED ORDER — LIDOCAINE 2% (20 MG/ML) 5 ML SYRINGE
INTRAMUSCULAR | Status: DC | PRN
  Administered 2022-11-02: 60 mg via INTRAVENOUS
  Administered 2022-11-02: 40 mg via INTRAVENOUS

## 2022-11-02 MED ORDER — LACTATED RINGERS IV SOLN: INTRAVENOUS | Status: DC

## 2022-11-02 MED ORDER — PROPOFOL 10 MG/ML IV BOLUS
INTRAVENOUS | Status: DC | PRN
  Administered 2022-11-02 (×3): 10 mg via INTRAVENOUS
  Administered 2022-11-02: 20 mg via INTRAVENOUS

## 2022-11-02 SURGICAL SUPPLY — 15 items

## 2022-11-02 NOTE — Interval H&P Note (Signed)
History and Physical Interval Note:  11/02/2022 12:58 PM  Linda Wilson  has presented today for surgery, with the diagnosis of upper abdominal pain.  The various methods of treatment have been discussed with the patient and family. After consideration of risks, benefits and other options for treatment, the patient has consented to  Procedure(s): ESOPHAGOGASTRODUODENOSCOPY (EGD) WITH PROPOFOL (N/A) as a surgical intervention.  The patient's history has been reviewed, patient examined, no change in status, stable for surgery.  I have reviewed the patient's chart and labs.  Questions were answered to the patient's satisfaction.     Dominic Pea Bera Pinela

## 2022-11-02 NOTE — Transfer of Care (Signed)
Immediate Anesthesia Transfer of Care Note  Patient: Linda Wilson  Procedure(s) Performed: ESOPHAGOGASTRODUODENOSCOPY (EGD) WITH PROPOFOL BIOPSY  Patient Location: PACU  Anesthesia Type:MAC  Level of Consciousness: awake, alert , and oriented  Airway & Oxygen Therapy: Patient Spontanous Breathing  Post-op Assessment: Report given to RN and Post -op Vital signs reviewed and stable  Post vital signs: Reviewed and stable  Last Vitals:  Vitals Value Taken Time  BP 129/70 11/02/22 1343  Temp 36.4 C 11/02/22 1343  Pulse 84 11/02/22 1343  Resp 16 11/02/22 1343  SpO2 97 % 11/02/22 1343    Last Pain:  Vitals:   11/02/22 1343  TempSrc: Temporal  PainSc: 0-No pain         Complications: No notable events documented.

## 2022-11-02 NOTE — Anesthesia Procedure Notes (Signed)
Procedure Name: MAC Date/Time: 11/02/2022 1:18 PM  Performed by: Janene Harvey, CRNAPre-anesthesia Checklist: Patient identified, Emergency Drugs available, Suction available and Patient being monitored Patient Re-evaluated:Patient Re-evaluated prior to induction Oxygen Delivery Method: Nasal cannula Induction Type: IV induction Placement Confirmation: positive ETCO2 Dental Injury: Teeth and Oropharynx as per pre-operative assessment  Comments: Heated high flow

## 2022-11-02 NOTE — Care Plan (Signed)
Pt received back to room from ENDO.  She is A&O, ambulates to chair.  NADN. Diet changed to clears for now per patient. Sreece, RN

## 2022-11-02 NOTE — Telephone Encounter (Signed)
Order placed for HIDA scan and message sent to radiology scheduling. Reminder placed for Ct scan in 3-4 months.  Office visit scheduled on 12/01/22 at 10:40 am. Mychart message sent to pt with appointment information.

## 2022-11-02 NOTE — Progress Notes (Signed)
[facility] PROGRESS NOTE    Progress Note  Linda Wilson  BJS:[id number] DOB: May 18, 1971 DOA: 11/01/2022 PCP: Sharion Balloon, FNP     Brief Narrative:   Linda Wilson is an 52 y.o. female past medical history significant of obesity, recent cholecystitis presents with bilateral abdominal pain radiating to her chest, CMP showed blood glucose of 123 bili 1.4 mild leukocytosis of 11.7 D-dimer positive SARS-CoV-2 and RSV were negative, CT angio of the chest negative for PE abdominal ultrasound showed nodular liver suggestive of cirrhosis gallbladder sludge, CT scan of the abdomen pelvis showed transverse colon mesenteric fat stranding surgery was consulted recommended if symptoms fail to improve HIDA scan, GI was consulted and plan for endoscopy 11/02/2022    Assessment/Plan:   Intractable Upper abdominal pain: Ongoing for several weeks, on her recent admission HIDA scan was negative. She went home but had more severe episodes with a fever of 102. CT scan of the abdomen pelvis showed transverse mesenteric fat stranding as well as splenomegaly. Renal surgery was consulted and recommended no surgical intervention if symptoms persist they recommended to repeat a HIDA scan. She has been started on fentanyl Zofran PPI and IV fluids. She has remained afebrile leukocytosis improved. GI was consulted recommended an EGD on 11/02/2022.  Normocytic anemia: Her hemoglobin is stable 9.0, no signs of overt bleeding.  Obesity, Class III, BMI 40-49.9 (morbid obesity) (HCC) Counseling.     DVT prophylaxis: scd Family Communication:none Status is: Observation The patient remains OBS appropriate and will d/c before 2 midnights.    Code Status:     Code Status Orders  (From admission, onward)           Start     Ordered   11/01/22 1532  Full code  Continuous       Question:  By:  Answer:  Consent: discussion documented in EHR   11/01/22 1540           Code Status History      Date Active Date Inactive Code Status Order ID Comments User Context   10/20/2022 2246 10/22/2022 0030 Full Code [id number]  Elwyn Reach, MD Inpatient         IV Access:   Peripheral IV   Procedures and diagnostic studies:   CT Angio Chest PE W and/or Wo Contrast  Result Date: 11/01/2022 CLINICAL DATA:  Pulmonary embolism (PE) suspected, low to intermediate prob, positive D-dimer EXAM: CT ANGIOGRAPHY CHEST WITH CONTRAST TECHNIQUE: Multidetector CT imaging of the chest was performed using the standard protocol during bolus administration of intravenous contrast. Multiplanar CT image reconstructions and MIPs were obtained to evaluate the vascular anatomy. RADIATION DOSE REDUCTION: This exam was performed according to the departmental dose-optimization program which includes automated exposure control, adjustment of the mA and/or kV according to patient size and/or use of iterative reconstruction technique. CONTRAST:  75 mL Omnipaque 350non-ionic IV contrast. COMPARISON:  None Available. FINDINGS: Cardiovascular: Satisfactory opacification of the pulmonary arteries to the segmental level. No evidence of pulmonary embolism. Normal heart size. No pericardial effusion. Mediastinum/Nodes: No enlarged mediastinal, hilar, or axillary lymph nodes. Thyroid gland, trachea, and esophagus demonstrate no significant findings. Lungs/Pleura: Lungs are clear. No pleural effusion or pneumothorax. Musculoskeletal: No chest wall abnormality. No acute or significant osseous findings. There are thoracic degenerative changes. Review of the MIP images confirms the above findings. IMPRESSION: No evidence of PE, aneurysm or dissection. Electronically Signed   By: Sammie Bench [name]   On: 11/01/2022 14:10  CT ABDOMEN PELVIS W CONTRAST  Result Date: 11/01/2022 CLINICAL DATA:  Abdominal pain EXAM: CT ABDOMEN AND PELVIS WITH CONTRAST TECHNIQUE: Multidetector CT imaging of the abdomen and pelvis was performed using  the standard protocol following bolus administration of intravenous contrast. RADIATION DOSE REDUCTION: This exam was performed according to the departmental dose-optimization program which includes automated exposure control, adjustment of the mA and/or kV according to patient size and/or use of iterative reconstruction technique. CONTRAST:  75 mL OMNIPAQUE IOHEXOL 350 MG/ML SOLN COMPARISON:  None Available. FINDINGS: Lower chest: No acute abnormality. Hepatobiliary: Subcentimeter hypodensity inferior right lobe of liver too small to characterize on CT scan. There was no intrahepatic ductal dilatation. No gallstones, gallbladder wall thickening, or extrahepatic biliary dilatation. Pancreas: Unremarkable. No pancreatic ductal dilatation or surrounding inflammatory changes. Spleen: Splenomegaly noted, 18.6 cm Adrenals/Urinary Tract: Adrenal glands are unremarkable. Kidneys are normal, without renal calculi, focal lesion, or hydronephrosis. Bladder is unremarkable. Stomach/Bowel: Stomach is within normal limits. Appendix appears normal. No evidence of bowel wall thickening, distention, or inflammatory changes. Vascular/Lymphatic: Unremarkable appearance of the aorta and IVC. Retro aortic left renal vein noted. There is adenopathy identified in the upper abdomen mesentery with portacaval node measuring 2.4 cm. There is some fat stranding noted in the transverse mesentery which is a nonspecific finding that can be seen with inflammation. Reproductive: Uterus and bilateral adnexa are unremarkable. Other: No abdominal wall hernia or abnormality. No abdominopelvic ascites. Musculoskeletal: No acute or significant osseous findings. IMPRESSION: 1. Upper abdominal transverse mesenteric fat stranding which can be seen with a nonspecific inflammatory process. 2. Splenomegaly. 3. Upper abdominal mesenteric adenopathy. Electronically Signed   By: Sammie Bench [name]   On: 11/01/2022 14:06   US Abdomen Limited RUQ  (LIVER/GB)  Result Date: 11/01/2022 CLINICAL DATA:  Right upper quadrant pain since last night EXAM: ULTRASOUND ABDOMEN LIMITED RIGHT UPPER QUADRANT COMPARISON:  None Available. FINDINGS: Gallbladder: Mild sludge in the gallbladder. The gallbladder is otherwise normal in appearance. Common bile duct: Diameter: 4.5 mm on limited images. Liver: Diffuse increased echogenicity. Mildly nodular contour. No focal mass. Portal vein is patent on color Doppler imaging with normal direction of blood flow towards the liver. Other: None. IMPRESSION: 1. Increased heterogeneous echogenicity in the liver and a mildly nodular contour. The contour also appears mildly nodular on the CT scan dated October 20, 2022. The findings suggest cirrhosis and superimposed hepatic steatosis. 2. Mild sludge in an otherwise normal gallbladder. Electronically Signed   By: Dorise Bullion III [name]   On: 11/01/2022 10:51   DG Chest 2 View  Result Date: 11/01/2022 CLINICAL DATA:  Pain. EXAM: CHEST - 2 VIEW COMPARISON:  December 22, 2021 FINDINGS: The heart size and mediastinal contours are within normal limits. Both lungs are clear. The visualized skeletal structures are unremarkable. IMPRESSION: No active cardiopulmonary disease. Electronically Signed   By: Dorise Bullion III [name]   On: 11/01/2022 09:44     Medical Consultants:   None.   Subjective:    Linda Wilson she continues to have epigastric pain  Objective:    Vitals:   11/01/22 1652 11/01/22 2009 11/02/22 0011 11/02/22 0415  BP: (!) 129/58 121/64 (!) 108/54 120/65  Pulse: 86 85 74 71  Resp: '17  18 16  '$ Temp: 98 F (36.7 C) 98.3 F (36.8 C) 98.7 F (37.1 C) 98 F (36.7 C)  TempSrc: Oral Oral  Oral  SpO2: 99% 95% 97% 100%  Weight:      Height:  SpO2: 100 %   Intake/Output Summary (Last 24 hours) at 11/02/2022 0716 Last data filed at 11/01/2022 1653 Gross per 24 hour  Intake 1000 ml  Output --  Net 1000 ml   Filed Weights   11/01/22 0912  Weight:  132.1 kg    Exam: General exam: In no acute distress. Respiratory system: Good air movement and clear to auscultation. Cardiovascular system: S1 & S2 heard, RRR. No JVD. Gastrointestinal system: Positive bowel, soft epigastric tenderness no rebound or guarding Extremities: No pedal edema. Skin: No rashes, lesions or ulcers Psychiatry: Judgement and insight appear normal. Mood & affect appropriate.    Data Reviewed:    Labs: Basic Metabolic Panel: Recent Labs  Lab 10/31/22 1533 11/01/22 0919 11/02/22 0348  NA 142 137 139  K 4.3 3.7 3.7  CL 103 102 103  CO2 '23 24 28  '$ GLUCOSE 93 123* 94  BUN 8 6 <5*  CREATININE 0.68 0.71 0.72  CALCIUM 9.2 8.7* 8.3*   GFR Estimated Creatinine Clearance: 121.6 mL/min (by C-G formula based on SCr of 0.72 mg/dL). Liver Function Tests: Recent Labs  Lab 11/01/22 0919 11/02/22 0348  AST 21 21  ALT 14 12  ALKPHOS 75 58  BILITOT 1.4* 0.9  PROT 7.8 6.5  ALBUMIN 3.4* 2.9*   Recent Labs  Lab 11/01/22 0919  LIPASE 25   No results for input(s): "AMMONIA" in the last 168 hours. Coagulation profile Recent Labs  Lab 11/02/22 0348  INR 1.2   COVID-19 Labs  Recent Labs    11/01/22 1106 11/01/22 1655  DDIMER 3.77*  --   FERRITIN  --  30    Lab Results  Component Value Date   SARSCOV2NAA NEGATIVE 11/01/2022   SARSCOV2NAA Detected (A) 06/03/2022    CBC: Recent Labs  Lab 10/31/22 1533 11/01/22 0919 11/02/22 0348  WBC 10.9* 11.7* 6.3  NEUTROABS 6.8 7.7  --   HGB 10.6* 10.5* 9.0*  HCT 36.3 35.4* 30.9*  MCV 71* 71.7* 72.4*  PLT 403 395 289   Cardiac Enzymes: No results for input(s): "CKTOTAL", "CKMB", "CKMBINDEX", "TROPONINI" in the last 168 hours. BNP (last 3 results) No results for input(s): "PROBNP" in the last 8760 hours. CBG: No results for input(s): "GLUCAP" in the last 168 hours. D-Dimer: Recent Labs    11/01/22 1106  DDIMER 3.77*   Hgb A1c: No results for input(s): "HGBA1C" in the last 72 hours. Lipid  Profile: No results for input(s): "CHOL", "HDL", "LDLCALC", "TRIG", "CHOLHDL", "LDLDIRECT" in the last 72 hours. Thyroid function studies: No results for input(s): "TSH", "T4TOTAL", "T3FREE", "THYROIDAB" in the last 72 hours.  Invalid input(s): "FREET3" Anemia work up: Recent Labs    11/01/22 1655  FERRITIN 30  TIBC 361  IRON 17*   Sepsis Labs: Recent Labs  Lab 10/31/22 1533 11/01/22 0919 11/02/22 0348  WBC 10.9* 11.7* 6.3   Microbiology Recent Results (from the past 240 hour(s))  Resp panel by RT-PCR (RSV, Flu A&B, Covid) Anterior Nasal Swab     Status: None   Collection Time: 11/01/22  4:21 PM   Specimen: Anterior Nasal Swab  Result Value Ref Range Status   SARS Coronavirus 2 by RT PCR NEGATIVE NEGATIVE Final   Influenza A by PCR NEGATIVE NEGATIVE Final   Influenza B by PCR NEGATIVE NEGATIVE Final    Comment: (NOTE) The Xpert Xpress SARS-CoV-2/FLU/RSV plus assay is intended as an aid in the diagnosis of influenza from Nasopharyngeal swab specimens and should not be used as a sole basis  for treatment. Nasal washings and aspirates are unacceptable for Xpert Xpress SARS-CoV-2/FLU/RSV testing.  Fact Sheet for Patients: EntrepreneurPulse.com.au  Fact Sheet for Healthcare Providers: IncredibleEmployment.be  This test is not yet approved or cleared by the [place] FDA and has been authorized for detection and/or diagnosis of SARS-CoV-2 by FDA under an Emergency Use Authorization (EUA). This EUA will remain in effect (meaning this test can be used) for the duration of the COVID-19 declaration under Section 564(b)(1) of the Act, 21 U.S.C. section 360bbb-3(b)(1), unless the authorization is terminated or revoked.     Resp Syncytial Virus by PCR NEGATIVE NEGATIVE Final    Comment: (NOTE) Fact Sheet for Patients: EntrepreneurPulse.com.au  Fact Sheet for Healthcare  Providers: IncredibleEmployment.be  This test is not yet approved or cleared by the [place] FDA and has been authorized for detection and/or diagnosis of SARS-CoV-2 by FDA under an Emergency Use Authorization (EUA). This EUA will remain in effect (meaning this test can be used) for the duration of the COVID-19 declaration under Section 564(b)(1) of the Act, 21 U.S.C. section 360bbb-3(b)(1), unless the authorization is terminated or revoked.  Performed at [facility], [place] [address], [place], [place] [address]      Medications:    enoxaparin (LOVENOX) injection  60 mg Subcutaneous Q24H   sodium chloride flush  3 mL Intravenous Q12H   Continuous Infusions:    LOS: 0 days   Charlynne Cousins  [facility]  11/02/2022, 7:16 AM

## 2022-11-02 NOTE — Op Note (Signed)
[facility] Patient Name: Linda Wilson [facility] Procedure Date : 11/02/2022 MRN: [id number] Attending MD: Gerrit Heck , MD, [id number] Date of Birth: 15-Sep-1971 CSN: [id number] Age: 52 Admit Type: Inpatient Procedure:                Upper GI endoscopy Indications:              Epigastric abdominal pain, Abdominal pain in the                            left upper quadrant, Nausea Providers:                Gerrit Heck, MD, Gabriel Earing, RN,                            Benetta Spar, Technician Referring MD:              Medicines:                Monitored Anesthesia Care Complications:            No immediate complications. Estimated Blood Loss:     Estimated blood loss was minimal. Procedure:                Pre-Anesthesia Assessment:                           - Prior to the procedure, a History and Physical                            was performed, and patient medications and                            allergies were reviewed. The patient's tolerance of                            previous anesthesia was also reviewed. The risks                            and benefits of the procedure and the sedation                            options and risks were discussed with the patient.                            All questions were answered, and informed consent                            was obtained. Prior Anticoagulants: The patient has                            taken no anticoagulant or antiplatelet agents. ASA                            Grade Assessment: III - A patient with severe  systemic disease. After reviewing the risks and                            benefits, the patient was deemed in satisfactory                            condition to undergo the procedure.                           After obtaining informed consent, the endoscope was                            passed under direct vision. Throughout the                             procedure, the patient's blood pressure, pulse, and                            oxygen saturations were monitored continuously. The                            [id number]-[id number] ([id number]) Olympus endoscope was introduced                            through the mouth, and advanced to the third part                            of duodenum. The upper GI endoscopy was                            accomplished without difficulty. The patient                            tolerated the procedure well. Scope In: Scope Out: Findings:      The examined esophagus was normal.      The entire examined stomach was normal. Biopsies were taken with a cold       forceps for Helicobacter pylori testing. Estimated blood loss was       minimal.      The examined duodenum was normal. Impression:               - Normal esophagus.                           - Normal stomach. Biopsied.                           - Normal examined duodenum. Recommendation:           - Return patient to hospital ward for possible                            discharge same day.                           - Advance diet as tolerated.                           -  Await pathology results.                           - Perform a repeat HIDA (hepatobiliary                            iminodiacetic acid) scan at appointment to be                            scheduled as an outpatient.                           - Perform CT scan (computed tomography) of the                            abdomen with contrast in 3-4 months to evaluate for                            resolution of mesenteric adenopathy.                           - Outpatient colonoscopy for routine CRC screening                            and evaluation of iron deficiency anemia.                           - Inpatient GI service with sign off at this time.                            WIll arrange for outpatient follow-up. Procedure Code(s):        --- Professional ---                           [id number],  Esophagogastroduodenoscopy, flexible,                            transoral; with biopsy, single or multiple Diagnosis Code(s):        --- Professional ---                           R10.13, Epigastric pain                           R10.12, Left upper quadrant pain                           R11.0, Nausea CPT copyright 2022 American Medical Association. All rights reserved. The codes documented in this report are preliminary and upon coder review may  be revised to meet current compliance requirements. Gerrit Heck, MD 11/02/2022 1:47:41 PM Number of Addenda: 0

## 2022-11-02 NOTE — Anesthesia Preprocedure Evaluation (Signed)
Anesthesia Evaluation  Patient identified by MRN, date of birth, ID band Patient awake    Reviewed: Allergy & Precautions, NPO status , Patient's Chart, lab work & pertinent test results  History of Anesthesia Complications Negative for: history of anesthetic complications  Airway Mallampati: III  TM Distance: >3 FB Neck ROM: Full    Dental  (+) Missing, Chipped, Poor Dentition   Pulmonary neg pulmonary ROS   breath sounds clear to auscultation       Cardiovascular negative cardio ROS  Rhythm:Regular     Neuro/Psych negative neurological ROS  negative psych ROS   GI/Hepatic Neg liver ROS,,,Abdominal pain   Endo/Other    Morbid obesity  Renal/GU negative Renal ROSLab Results      Component                Value               Date                      CREATININE               0.72                11/02/2022                Musculoskeletal  (+) Arthritis ,    Abdominal   Peds  Hematology  (+) Blood dyscrasia, anemia Lab Results      Component                Value               Date                      WBC                      6.3                 11/02/2022                HGB                      9.0 (L)             11/02/2022                HCT                      30.9 (L)            11/02/2022                MCV                      72.4 (L)            11/02/2022                PLT                      289                 11/02/2022              Anesthesia Other Findings   Reproductive/Obstetrics  Anesthesia Physical Anesthesia Plan  ASA: 3  Anesthesia Plan: MAC   Post-op Pain Management: Minimal or no pain anticipated   Induction: Intravenous  PONV Risk Score and Plan: 2 and Propofol infusion and Treatment may vary due to age or medical condition  Airway Management Planned: Nasal Cannula and Natural Airway  Additional Equipment: None  Intra-op  Plan:   Post-operative Plan:   Informed Consent: I have reviewed the patients History and Physical, chart, labs and discussed the procedure including the risks, benefits and alternatives for the proposed anesthesia with the patient or authorized representative who has indicated his/her understanding and acceptance.     Dental advisory given  Plan Discussed with: CRNA  Anesthesia Plan Comments:        Anesthesia Quick Evaluation

## 2022-11-02 NOTE — Care Plan (Signed)
Pt taken to pre op for EGD. Sreece, RN

## 2022-11-02 NOTE — Telephone Encounter (Signed)
-----   Message from [place], DO sent at 11/02/2022  1:49 PM EST ----- I expect this patient will be discharged later today or tomorrow morning.  Please arrange for the following: - Outpatient HIDA scan - CT abdomen with contrast in 3-4 months to evaluate for resolution of mesenteric adenopathy - Schedule follow-up appointment in the GI clinic in the next 3-4 weeks.  Will need to arrange for outpatient colonoscopy for CRC screening at that time  Thank you

## 2022-11-03 ENCOUNTER — Other Ambulatory Visit: Payer: Self-pay

## 2022-11-03 DIAGNOSIS — R1010 Upper abdominal pain, unspecified: Secondary | ICD-10-CM

## 2022-11-03 DIAGNOSIS — R109 Unspecified abdominal pain: Secondary | ICD-10-CM | POA: Diagnosis not present

## 2022-11-03 DIAGNOSIS — R933 Abnormal findings on diagnostic imaging of other parts of digestive tract: Secondary | ICD-10-CM | POA: Diagnosis not present

## 2022-11-03 LAB — SURGICAL PATHOLOGY

## 2022-11-03 NOTE — Progress Notes (Signed)
Patient alert and oriented, verbalized understanding of dc instructions. All belongings and dc papers given to patient. Iv dcd , site unremarkable. Ccmd notified of dc order.patient will be transferred to [facility].

## 2022-11-03 NOTE — Progress Notes (Signed)
   11/03/22 1000  Mobility  Activity Ambulated independently in hallway  Level of Assistance Independent  Assistive Device None  Distance Ambulated (ft) 300 ft  Activity Response Tolerated well  Mobility Referral Yes  $Mobility charge 1 Mobility   Mobility Specialist Progress Note  Pt was in bed and agreeable. Had no c/o pain. Returned to bed w/ all needs met and call bell in reach.   Lucious Groves Mobility Specialist  Please contact via SecureChat or Rehab office at [phone]

## 2022-11-03 NOTE — Anesthesia Postprocedure Evaluation (Signed)
Anesthesia Post Note  Patient: Linda Wilson  Procedure(s) Performed: ESOPHAGOGASTRODUODENOSCOPY (EGD) WITH PROPOFOL BIOPSY     Patient location during evaluation: Endoscopy Anesthesia Type: MAC Level of consciousness: awake and alert Pain management: pain level controlled Vital Signs Assessment: post-procedure vital signs reviewed and stable Respiratory status: spontaneous breathing, nonlabored ventilation and respiratory function stable Cardiovascular status: stable and blood pressure returned to baseline Postop Assessment: no apparent nausea or vomiting Anesthetic complications: no   No notable events documented.  Last Vitals:  Vitals:   11/03/22 0358 11/03/22 0834  BP: (!) 117/53 (!) 116/53  Pulse: 71 72  Resp:  18  Temp: 36.5 C 36.6 C  SpO2: 95% 96%    Last Pain:  Vitals:   11/03/22 1000  TempSrc:   PainSc: 0-No pain                 Lydia Meng

## 2022-11-03 NOTE — Discharge Summary (Signed)
Physician Discharge Summary  Kobie L Hara HUT:[id number] DOB: 1971-07-17 DOA: 11/01/2022  PCP: Sharion Balloon, FNP  Admit date: 11/01/2022 Discharge date: 11/03/2022  Admitted From: Home Disposition:  Home  Recommendations for Outpatient Follow-up:  Follow up with GI in 1-2 weeks Please obtain BMP/CBC in one week   Home Health:No Equipment/Devices:None  Discharge Condition:Stable CODE STATUS:Full Diet recommendation: Heart Healthy   Brief/Interim Summary:  52 y.o. female past medical history significant of obesity, recent cholecystitis presents with bilateral abdominal pain radiating to her chest, CMP showed blood glucose of 123 bili 1.4 mild leukocytosis of 11.7 D-dimer positive SARS-CoV-2 and RSV were negative, CT angio of the chest negative for PE abdominal ultrasound showed nodular liver suggestive of cirrhosis gallbladder sludge, CT scan of the abdomen pelvis showed transverse colon mesenteric fat stranding surgery was consulted recommended if symptoms fail to improve HIDA scan, GI was consulted and plan for endoscopy 11/02/2022   Discharge Diagnoses:  Principal Problem:   Intractable abdominal pain Active Problems:   Obesity, Class III, BMI 40-49.9 (morbid obesity) (HCC)   Normocytic anemia   Microcytic anemia   Abnormal finding on GI tract imaging   Upper abdominal pain  Intractable upper abdominal pain: Ongoing for several weeks, on her recent admission HIDA scan was negative CT scan of the abdomen pelvis showed trans versus mesenteric fat stranding as well as splenomegaly she remained afebrile with no leukocytosis. General surgery was consulted and recommended no further intervention but if her symptoms persist HIDA scan can be performed as an outpatient. She was treated with IV fluid Zofran and PPI her abdominal pain improved. GI was consulted recommended an EGD that was unremarkable they would like to see her in 2 to 4 weeks as an outpatient.  Normocytic anemia: No  signs of overt bleeding hemoglobin has remained stable.  Obesity: Counseled.  Discharge Instructions  Discharge Instructions     Diet - low sodium heart healthy   Complete by: As directed    Increase activity slowly   Complete by: As directed       Allergies as of 11/03/2022   No Known Allergies      Medication List     TAKE these medications    clobetasol cream 0.05 % Commonly known as: TEMOVATE Apply 1 application topically 2 (two) times daily.   FLEXERIL PO Take 1 tablet by mouth daily as needed (pain).   glucosamine-chondroitin 500-400 MG tablet Take 1 tablet by mouth daily.   loratadine 10 MG tablet Commonly known as: CLARITIN Take 10 mg by mouth daily.   ondansetron 4 MG tablet Commonly known as: Zofran Take 1 tablet (4 mg total) by mouth every 8 (eight) hours as needed for nausea or vomiting.        No Known Allergies  Consultations: Gastroenterology   Procedures/Studies: CT Angio Chest PE W and/or Wo Contrast  Result Date: 11/01/2022 CLINICAL DATA:  Pulmonary embolism (PE) suspected, low to intermediate prob, positive D-dimer EXAM: CT ANGIOGRAPHY CHEST WITH CONTRAST TECHNIQUE: Multidetector CT imaging of the chest was performed using the standard protocol during bolus administration of intravenous contrast. Multiplanar CT image reconstructions and MIPs were obtained to evaluate the vascular anatomy. RADIATION DOSE REDUCTION: This exam was performed according to the departmental dose-optimization program which includes automated exposure control, adjustment of the mA and/or kV according to patient size and/or use of iterative reconstruction technique. CONTRAST:  75 mL Omnipaque 350non-ionic IV contrast. COMPARISON:  None Available. FINDINGS: Cardiovascular: Satisfactory opacification of the pulmonary arteries  to the segmental level. No evidence of pulmonary embolism. Normal heart size. No pericardial effusion. Mediastinum/Nodes: No enlarged mediastinal,  hilar, or axillary lymph nodes. Thyroid gland, trachea, and esophagus demonstrate no significant findings. Lungs/Pleura: Lungs are clear. No pleural effusion or pneumothorax. Musculoskeletal: No chest wall abnormality. No acute or significant osseous findings. There are thoracic degenerative changes. Review of the MIP images confirms the above findings. IMPRESSION: No evidence of PE, aneurysm or dissection. Electronically Signed   By: Sammie Bench [name]   On: 11/01/2022 14:10   CT ABDOMEN PELVIS W CONTRAST  Result Date: 11/01/2022 CLINICAL DATA:  Abdominal pain EXAM: CT ABDOMEN AND PELVIS WITH CONTRAST TECHNIQUE: Multidetector CT imaging of the abdomen and pelvis was performed using the standard protocol following bolus administration of intravenous contrast. RADIATION DOSE REDUCTION: This exam was performed according to the departmental dose-optimization program which includes automated exposure control, adjustment of the mA and/or kV according to patient size and/or use of iterative reconstruction technique. CONTRAST:  75 mL OMNIPAQUE IOHEXOL 350 MG/ML SOLN COMPARISON:  None Available. FINDINGS: Lower chest: No acute abnormality. Hepatobiliary: Subcentimeter hypodensity inferior right lobe of liver too small to characterize on CT scan. There was no intrahepatic ductal dilatation. No gallstones, gallbladder wall thickening, or extrahepatic biliary dilatation. Pancreas: Unremarkable. No pancreatic ductal dilatation or surrounding inflammatory changes. Spleen: Splenomegaly noted, 18.6 cm Adrenals/Urinary Tract: Adrenal glands are unremarkable. Kidneys are normal, without renal calculi, focal lesion, or hydronephrosis. Bladder is unremarkable. Stomach/Bowel: Stomach is within normal limits. Appendix appears normal. No evidence of bowel wall thickening, distention, or inflammatory changes. Vascular/Lymphatic: Unremarkable appearance of the aorta and IVC. Retro aortic left renal vein noted. There is adenopathy  identified in the upper abdomen mesentery with portacaval node measuring 2.4 cm. There is some fat stranding noted in the transverse mesentery which is a nonspecific finding that can be seen with inflammation. Reproductive: Uterus and bilateral adnexa are unremarkable. Other: No abdominal wall hernia or abnormality. No abdominopelvic ascites. Musculoskeletal: No acute or significant osseous findings. IMPRESSION: 1. Upper abdominal transverse mesenteric fat stranding which can be seen with a nonspecific inflammatory process. 2. Splenomegaly. 3. Upper abdominal mesenteric adenopathy. Electronically Signed   By: Sammie Bench [name]   On: 11/01/2022 14:06   US Abdomen Limited RUQ (LIVER/GB)  Result Date: 11/01/2022 CLINICAL DATA:  Right upper quadrant pain since last night EXAM: ULTRASOUND ABDOMEN LIMITED RIGHT UPPER QUADRANT COMPARISON:  None Available. FINDINGS: Gallbladder: Mild sludge in the gallbladder. The gallbladder is otherwise normal in appearance. Common bile duct: Diameter: 4.5 mm on limited images. Liver: Diffuse increased echogenicity. Mildly nodular contour. No focal mass. Portal vein is patent on color Doppler imaging with normal direction of blood flow towards the liver. Other: None. IMPRESSION: 1. Increased heterogeneous echogenicity in the liver and a mildly nodular contour. The contour also appears mildly nodular on the CT scan dated October 20, 2022. The findings suggest cirrhosis and superimposed hepatic steatosis. 2. Mild sludge in an otherwise normal gallbladder. Electronically Signed   By: Dorise Bullion III [name]   On: 11/01/2022 10:51   DG Chest 2 View  Result Date: 11/01/2022 CLINICAL DATA:  Pain. EXAM: CHEST - 2 VIEW COMPARISON:  December 22, 2021 FINDINGS: The heart size and mediastinal contours are within normal limits. Both lungs are clear. The visualized skeletal structures are unremarkable. IMPRESSION: No active cardiopulmonary disease. Electronically Signed   By: Dorise Bullion  III [name]   On: 11/01/2022 09:44   NM Hepatobiliary Liver Func  Result Date:  10/21/2022 CLINICAL DATA:  Right upper quadrant abdominal pain. EXAM: NUCLEAR MEDICINE HEPATOBILIARY IMAGING TECHNIQUE: Sequential images of the abdomen were obtained out to 60 minutes following intravenous administration of radiopharmaceutical. RADIOPHARMACEUTICALS:  5.0 mCi Tc-68m Choletec IV COMPARISON:  Ultrasound October 20, 2022 FINDINGS: Prompt uptake and biliary excretion of activity by the liver is seen. Gallbladder activity is visualized, consistent with patency of cystic duct. Biliary activity passes into small bowel, consistent with patent common bile duct. IMPRESSION: Patent cystic and common bile ducts. No evidence of acute cholecystitis Electronically Signed   By: JDahlia BailiffM.D.   On: 10/21/2022 12:10   UKoreaAbdomen Limited RUQ (LIVER/GB)  Result Date: 10/20/2022 CLINICAL DATA:  Epigastric pain for 2 days with nausea and vomiting. Stranding along the porta hepatis on CT scan earlier today. EXAM: ULTRASOUND ABDOMEN LIMITED RIGHT UPPER QUADRANT COMPARISON:  CT scan 10/20/2022 FINDINGS: Gallbladder: Sonographic Murphy sign present. There is some mild sludge in the gallbladder without gallstones or gallbladder wall thickening. No overt pericholecystic fluid. Common bile duct: Not well seen on ultrasound, but of normal caliber on the CT scan earlier today. Liver: Diffuse hepatic accentuated echogenicity and reduced sonic penetration compatible with hepatic steatosis. No focal liver lesion is identified. Portal vein is patent on color Doppler imaging with normal direction of blood flow towards the liver. Other: None. IMPRESSION: 1. Sonographic Murphy sign present. There is some mild sludge in the gallbladder without gallstones or gallbladder wall thickening. No overt pericholecystic fluid, although there was stranding adjacent to parts of the gallbladder and porta hepatis on recent CT which can be seen in the setting of  cholecystitis. Correlate clinically in management. 2. Hepatic steatosis. Electronically Signed   By: WVan ClinesM.D.   On: 10/20/2022 17:49   CT ABDOMEN PELVIS W CONTRAST  Result Date: 10/20/2022 CLINICAL DATA:  Abdominal pain EXAM: CT ABDOMEN AND PELVIS WITH CONTRAST TECHNIQUE: Multidetector CT imaging of the abdomen and pelvis was performed using the standard protocol following bolus administration of intravenous contrast. RADIATION DOSE REDUCTION: This exam was performed according to the departmental dose-optimization program which includes automated exposure control, adjustment of the mA and/or kV according to patient size and/or use of iterative reconstruction technique. CONTRAST:  125 mL OMNIPAQUE IOHEXOL 300 MG/ML  SOLN COMPARISON:  None Available. FINDINGS: Lower chest: No acute abnormality. Hepatobiliary: Subcentimeter fat attenuation nodule in the right lobe of the liver posteriorly not likely represent a clinically significant finding. 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 No intrahepatic ductal dilatation identified. Gallbladder is distended and there is some stranding in the pericholecystic fat. These findings suggest possible cholecystitis which could be assessed further with HIDA scan and/or right upper quadrant ultrasound if indicated. Pancreas: Unremarkable. No pancreatic ductal dilatation or surrounding inflammatory changes. Spleen: Normal in size without focal abnormality. Adrenals/Urinary Tract: Normal adrenal glands. No hydronephrosis. No renal parenchymal lesions. Nonobstructing 3 mm stones right kidney midpole. Stomach/Bowel: Stomach is within normal limits. Appendix appears normal. No evidence of bowel wall thickening, distention, or inflammatory changes. Vascular/Lymphatic: No significant vascular findings are present. No enlarged abdominal or pelvic lymph nodes. Reproductive: Uterus and bilateral adnexa are unremarkable. Other: No abdominal wall hernia or abnormality. No  abdominopelvic ascites. Musculoskeletal: No acute or significant osseous findings. IMPRESSION: 1. Distended gallbladder with possible pericholecystic inflammatory changes. Consider possible cholecystitis. 2. Two right kidney 3 mm stones.  No hydronephrosis. 3. Otherwise no acute abdominal or pelvic pathology identified. Electronically Signed   By: JSammie BenchM.D.   On: 10/20/2022 16:29   (  Echo, Carotid, EGD, Colonoscopy, ERCP)    Subjective: No complaints  Discharge Exam: Vitals:   11/03/22 0358 11/03/22 0834  BP: (!) 117/53 (!) 116/53  Pulse: 71 72  Resp:  18  Temp: 97.7 F (36.5 C) 97.8 F (36.6 C)  SpO2: 95% 96%   Vitals:   11/02/22 2027 11/02/22 2349 11/03/22 0358 11/03/22 0834  BP: 132/61 125/61 (!) 117/53 (!) 116/53  Pulse: 75 70 71 72  Resp:  18  18  Temp: 98.4 F (36.9 C) 98.6 F (37 C) 97.7 F (36.5 C) 97.8 F (36.6 C)  TempSrc: Oral  Oral Oral  SpO2: 97% 99% 95% 96%  Weight:      Height:        General: Pt is alert, awake, not in acute distress Cardiovascular: RRR, S1/S2 +, no rubs, no gallops Respiratory: CTA bilaterally, no wheezing, no rhonchi Abdominal: Soft, NT, ND, bowel sounds + Extremities: no edema, no cyanosis    The results of significant diagnostics from this hospitalization (including imaging, microbiology, ancillary and laboratory) are listed below for reference.     Microbiology: Recent Results (from the past 240 hour(s))  Resp panel by RT-PCR (RSV, Flu A&B, Covid) Anterior Nasal Swab     Status: None   Collection Time: 11/01/22  4:21 PM   Specimen: Anterior Nasal Swab  Result Value Ref Range Status   SARS Coronavirus 2 by RT PCR NEGATIVE NEGATIVE Final   Influenza A by PCR NEGATIVE NEGATIVE Final   Influenza B by PCR NEGATIVE NEGATIVE Final    Comment: (NOTE) The Xpert Xpress SARS-CoV-2/FLU/RSV plus assay is intended as an aid in the diagnosis of influenza from Nasopharyngeal swab specimens and should not be used as a sole basis  for treatment. Nasal washings and aspirates are unacceptable for Xpert Xpress SARS-CoV-2/FLU/RSV testing.  Fact Sheet for Patients: EntrepreneurPulse.com.au  Fact Sheet for Healthcare Providers: IncredibleEmployment.be  This test is not yet approved or cleared by the [place] FDA and has been authorized for detection and/or diagnosis of SARS-CoV-2 by FDA under an Emergency Use Authorization (EUA). This EUA will remain in effect (meaning this test can be used) for the duration of the COVID-19 declaration under Section 564(b)(1) of the Act, 21 U.S.C. section 360bbb-3(b)(1), unless the authorization is terminated or revoked.     Resp Syncytial Virus by PCR NEGATIVE NEGATIVE Final    Comment: (NOTE) Fact Sheet for Patients: EntrepreneurPulse.com.au  Fact Sheet for Healthcare Providers: IncredibleEmployment.be  This test is not yet approved or cleared by the [place] FDA and has been authorized for detection and/or diagnosis of SARS-CoV-2 by FDA under an Emergency Use Authorization (EUA). This EUA will remain in effect (meaning this test can be used) for the duration of the COVID-19 declaration under Section 564(b)(1) of the Act, 21 U.S.C. section 360bbb-3(b)(1), unless the authorization is terminated or revoked.  Performed at [facility], [place] [address], [place], [place] [address]      Labs: BNP (last 3 results) No results for input(s): "BNP" in the last 8760 hours. Basic Metabolic Panel: Recent Labs  Lab 10/31/22 1533 11/01/22 0919 11/02/22 0348  NA 142 137 139  K 4.3 3.7 3.7  CL 103 102 103  CO2 '23 24 28  '$ GLUCOSE 93 123* 94  BUN 8 6 <5*  CREATININE 0.68 0.71 0.72  CALCIUM 9.2 8.7* 8.3*   Liver Function Tests: Recent Labs  Lab 11/01/22 0919 11/02/22 0348  AST 21 21  ALT 14 12  ALKPHOS  75 58  BILITOT 1.4* 0.9  PROT 7.8 6.5  ALBUMIN 3.4* 2.9*   Recent Labs  Lab  11/01/22 0919  LIPASE 25   No results for input(s): "AMMONIA" in the last 168 hours. CBC: Recent Labs  Lab 10/31/22 1533 11/01/22 0919 11/02/22 0348  WBC 10.9* 11.7* 6.3  NEUTROABS 6.8 7.7  --   HGB 10.6* 10.5* 9.0*  HCT 36.3 35.4* 30.9*  MCV 71* 71.7* 72.4*  PLT 403 395 289   Cardiac Enzymes: No results for input(s): "CKTOTAL", "CKMB", "CKMBINDEX", "TROPONINI" in the last 168 hours. BNP: Invalid input(s): "POCBNP" CBG: No results for input(s): "GLUCAP" in the last 168 hours. D-Dimer Recent Labs    11/01/22 1106  DDIMER 3.77*   Hgb A1c No results for input(s): "HGBA1C" in the last 72 hours. Lipid Profile No results for input(s): "CHOL", "HDL", "LDLCALC", "TRIG", "CHOLHDL", "LDLDIRECT" in the last 72 hours. Thyroid function studies No results for input(s): "TSH", "T4TOTAL", "T3FREE", "THYROIDAB" in the last 72 hours.  Invalid input(s): "FREET3" Anemia work up Recent Labs    11/01/22 1655  FERRITIN 30  TIBC 361  IRON 17*   Urinalysis    Component Value Date/Time   COLORURINE AMBER (A) 11/01/2022 1012   APPEARANCEUR HAZY (A) 11/01/2022 1012   LABSPEC 1.019 11/01/2022 1012   PHURINE 6.0 11/01/2022 1012   GLUCOSEU NEGATIVE 11/01/2022 1012   HGBUR NEGATIVE 11/01/2022 1012   BILIRUBINUR NEGATIVE 11/01/2022 1012   KETONESUR NEGATIVE 11/01/2022 1012   PROTEINUR NEGATIVE 11/01/2022 1012   NITRITE NEGATIVE 11/01/2022 1012   LEUKOCYTESUR NEGATIVE 11/01/2022 1012   Sepsis Labs Recent Labs  Lab 10/31/22 1533 11/01/22 0919 11/02/22 0348  WBC 10.9* 11.7* 6.3   Microbiology Recent Results (from the past 240 hour(s))  Resp panel by RT-PCR (RSV, Flu A&B, Covid) Anterior Nasal Swab     Status: None   Collection Time: 11/01/22  4:21 PM   Specimen: Anterior Nasal Swab  Result Value Ref Range Status   SARS Coronavirus 2 by RT PCR NEGATIVE NEGATIVE Final   Influenza A by PCR NEGATIVE NEGATIVE Final   Influenza B by PCR NEGATIVE NEGATIVE Final    Comment:  (NOTE) The Xpert Xpress SARS-CoV-2/FLU/RSV plus assay is intended as an aid in the diagnosis of influenza from Nasopharyngeal swab specimens and should not be used as a sole basis for treatment. Nasal washings and aspirates are unacceptable for Xpert Xpress SARS-CoV-2/FLU/RSV testing.  Fact Sheet for Patients: EntrepreneurPulse.com.au  Fact Sheet for Healthcare Providers: IncredibleEmployment.be  This test is not yet approved or cleared by the [place] FDA and has been authorized for detection and/or diagnosis of SARS-CoV-2 by FDA under an Emergency Use Authorization (EUA). This EUA will remain in effect (meaning this test can be used) for the duration of the COVID-19 declaration under Section 564(b)(1) of the Act, 21 U.S.C. section 360bbb-3(b)(1), unless the authorization is terminated or revoked.     Resp Syncytial Virus by PCR NEGATIVE NEGATIVE Final    Comment: (NOTE) Fact Sheet for Patients: EntrepreneurPulse.com.au  Fact Sheet for Healthcare Providers: IncredibleEmployment.be  This test is not yet approved or cleared by the [place] FDA and has been authorized for detection and/or diagnosis of SARS-CoV-2 by FDA under an Emergency Use Authorization (EUA). This EUA will remain in effect (meaning this test can be used) for the duration of the COVID-19 declaration under Section 564(b)(1) of the Act, 21 U.S.C. section 360bbb-3(b)(1), unless the authorization is terminated or revoked.  Performed at [facility],  [address], [place], [place] [address]     SIGNED:   Charlynne Cousins, MD  [facility] 11/03/2022, 8:56 AM Pager   If 7PM-7AM, please contact night-coverage [email or web address] Password [email or web address]

## 2022-11-04 ENCOUNTER — Encounter: Payer: Self-pay | Admitting: *Deleted

## 2022-11-04 ENCOUNTER — Telehealth: Payer: Self-pay

## 2022-11-04 NOTE — Telephone Encounter (Signed)
Transition Care Management Unsuccessful Follow-up Telephone Call  Date of discharge and from where:  [facility] 11/03/2022  Attempts:  1st Attempt  Reason for unsuccessful TCM follow-up call:  Left voice message Juanda Crumble, [place] Direct Dial [phone]

## 2022-11-05 ENCOUNTER — Encounter (HOSPITAL_COMMUNITY): Payer: Self-pay | Admitting: Gastroenterology

## 2022-11-07 ENCOUNTER — Encounter: Payer: Self-pay | Admitting: *Deleted

## 2022-11-07 NOTE — Telephone Encounter (Signed)
Transition Care Management Unsuccessful Follow-up Telephone Call  Date of discharge and from where:  [facility] 11/03/2022  Attempts:  2nd Attempt  Reason for unsuccessful TCM follow-up call:  Left voice message Juanda Crumble, [place] Direct Dial [phone]

## 2022-11-08 NOTE — Transitions of Care (Post Inpatient/ED Visit) (Signed)
   11/08/2022  Name: Linda Wilson MRN: [id number] DOB: [birth year]  Today's TOC FU Call Status: Today's TOC FU Call Status:: Unsuccessful Call (3rd Attempt) Unsuccessful Call (3rd Attempt) Date: 11/08/22  Attempted to reach the patient regarding the most recent Inpatient/ED visit.  Follow Up Plan: No further outreach attempts will be made at this time. We have been unable to contact the patient.  Signature Juanda Crumble, [place] Direct Dial [phone]

## 2022-11-08 NOTE — Transitions of Care (Post Inpatient/ED Visit) (Signed)
   11/08/2022  Name: Noell L Pugmire MRN: [id number] DOB: 02/15/1971  Today's TOC FU Call Status: Today's TOC FU Call Status:: Unsuccessful Call (3rd Attempt) Unsuccessful Call (3rd Attempt) Date: 11/08/22  Attempted to reach the patient regarding the most recent Inpatient/ED visit.  Follow Up Plan: No further outreach attempts will be made at this time. We have been unable to contact the patient.  Signature Tilda Samudio, LPN [facility] Nurse Health Advisor Direct Dial [phone]  

## 2022-11-09 DIAGNOSIS — K819 Cholecystitis, unspecified: Secondary | ICD-10-CM | POA: Diagnosis not present

## 2022-11-09 DIAGNOSIS — K828 Other specified diseases of gallbladder: Secondary | ICD-10-CM | POA: Diagnosis not present

## 2022-11-09 DIAGNOSIS — R1011 Right upper quadrant pain: Secondary | ICD-10-CM | POA: Diagnosis not present

## 2022-11-10 NOTE — Addendum Note (Signed)
Addended by: Evelina Dun A on: 11/10/2022 01:39 PM   Modules accepted: Level of Service

## 2022-11-11 NOTE — Progress Notes (Signed)
Sent message, via epic in basket, requesting orders in epic from surgeon.  

## 2022-11-13 ENCOUNTER — Ambulatory Visit: Payer: Self-pay | Admitting: Surgery

## 2022-11-13 ENCOUNTER — Encounter (HOSPITAL_COMMUNITY): Payer: Self-pay | Admitting: Surgery

## 2022-11-13 DIAGNOSIS — K828 Other specified diseases of gallbladder: Secondary | ICD-10-CM | POA: Diagnosis present

## 2022-11-13 DIAGNOSIS — K819 Cholecystitis, unspecified: Secondary | ICD-10-CM | POA: Diagnosis present

## 2022-11-13 NOTE — H&P (Signed)
REFERRING PHYSICIAN: Sharion Balloon, NP  PROVIDER: Susann Lawhorne Charlotta Newton, MD   Chief Complaint: New Consultation (Abdominal pain)  History of Present Illness:  Patient presents for surgical evaluation of right upper quadrant abdominal pain with signs and symptoms of acute cholecystitis. Patient initially developed symptoms in late January. She was hospitalized and underwent surgical consultation. CT scan of the abdomen and pelvis demonstrated evidence of inflammatory changes around the gallbladder consistent with cholecystitis. No gallstones were seen. Ultrasound demonstrated sludge within the gallbladder but no gallstones. There was hepatic steatosis. Patient underwent nuclear medicine hepatobiliary scan which showed a patent cystic duct and common bile duct without evidence of acute cholecystitis. Patient was discharged home but had recurrent symptoms. These seem to be exacerbated by solid food. Patient was readmitted to the hospital and again underwent a CT scan and an ultrasound on November 01, 2022. These again demonstrated sludge within the gallbladder and raise the possibility of hepatic cirrhosis. Liver enzymes have been essentially normal. Total bilirubin was transiently elevated. Platelet count is normal. Patient underwent upper endoscopy by gastroenterology which was essentially a normal study. Patient now continues to have persistent symptoms of right upper quadrant abdominal pain particularly following meals. She presents today for surgical assessment for consideration for cholecystectomy. There is a family history of gallbladder disease in the patient's sister who had similar symptoms and required cholecystectomy. The patient's only previous abdominal surgery is cesarean section. 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.  Review of Systems: A complete review of systems was obtained from the patient. I have reviewed this information and discussed as  appropriate with the patient. See HPI as well for other ROS.  Review of Systems  Constitutional: Positive for chills. Negative for fever.  HENT: Negative.  Eyes: Negative.  Respiratory: Negative.  Cardiovascular: Negative.  Gastrointestinal: Positive for abdominal pain and nausea.  Genitourinary: Negative.  Musculoskeletal: Negative.  Skin: Negative.  Neurological: Negative.  Endo/Heme/Allergies: Negative.  Psychiatric/Behavioral: Negative.    Medical History: Past Medical History:  Diagnosis Date  History of cancer   Patient Active Problem List  Diagnosis  RUQ abdominal pain  Cholecystitis without cholelithiasis  Gallbladder sludge   Past Surgical History:  Procedure Laterality Date  CESAREAN SECTION  LAPAROSCOPIC TUBAL LIGATION    No Known Allergies  Current Outpatient Medications on File Prior to Visit  Medication Sig Dispense Refill  cyclobenzaprine (FLEXERIL) 10 MG tablet  loratadine (CLARITIN) 10 mg tablet Take 10 mg by mouth once daily  multivitamin tablet Take 1 tablet by mouth once daily   No current facility-administered medications on file prior to visit.   Family History  Problem Relation Age of Onset  Stroke Father  Skin cancer Father  Obesity Father  High blood pressure (Hypertension) Father  Coronary Artery Disease (Blocked arteries around heart) Father  Obesity Sister  High blood pressure (Hypertension) Sister    Social History   Tobacco Use  Smoking Status Never  Smokeless Tobacco Never    Social History   Socioeconomic History  Marital status: Single  Tobacco Use  Smoking status: Never  Smokeless tobacco: Never  Vaping Use  Vaping Use: Never used  Substance and Sexual Activity  Alcohol use: Never  Drug use: Never   Objective:   Vitals:  BP: 126/78  Pulse: 101  Temp: 36.7 C (98 F)  SpO2: 96%  Weight: (!) 130.7 kg (288 lb 3.2 oz)  Height: 175.3 cm (5' 9"$ )   Body mass index is 42.56  kg/m.  Physical Exam    GENERAL APPEARANCE Comfortable, no acute issues Development: normal Gross deformities: none  SKIN Rash, lesions, ulcers: none Induration, erythema: none Nodules: none palpable  EYES Conjunctiva and lids: normal Pupils: equal and reactive  EARS, NOSE, MOUTH, THROAT External ears: no lesion or deformity External nose: no lesion or deformity Hearing: grossly normal  NECK Symmetric: yes Trachea: midline Thyroid: no palpable nodules in the thyroid bed  CHEST Respiratory effort: normal Retraction or accessory muscle use: no Breath sounds: normal bilaterally Rales, rhonchi, wheeze: none  CARDIOVASCULAR Auscultation: regular rhythm, normal rate Murmurs: none Pulses: radial pulse 2+ palpable Lower extremity edema: none  ABDOMEN Abdomen is soft without distention. No hepatosplenomegaly. No sign of hernia. Palpation shows mild to moderate tenderness in the right upper quadrant. There is voluntary guarding. There are no palpable masses.  GENITOURINARY/RECTAL Not assessed  MUSCULOSKELETAL Station and gait: normal Digits and nails: no clubbing or cyanosis Muscle strength: grossly normal all extremities Range of motion: grossly normal all extremities Deformity: none  LYMPHATIC Cervical: none palpable Supraclavicular: none palpable  PSYCHIATRIC Oriented to person, place, and time: yes Mood and affect: normal for situation Judgment and insight: appropriate for situation   Assessment and Plan:   RUQ abdominal pain Cholecystitis without cholelithiasis Gallbladder sludge  Patient presents with persistent symptoms of intermittent right upper quadrant abdominal pain. I provided her with written literature on gallbladder surgery to review at home.  Today we reviewed her clinical history. We reviewed her imaging studies. We reviewed her hospitalization records. We reviewed her EGD report. We discussed options for management.  I have recommended proceeding with  laparoscopic cholecystectomy with intraoperative cholangiography. We will also plan a liver biopsy at the time of surgery. We discussed the risk and benefits of the procedure including the risk of conversion to open surgery. We discussed the hospital stay to be anticipated. We discussed her postoperative recovery and returned to work and activities. The patient understands and wishes to proceed with surgery in the near future.   Armandina Gemma, MD [facility] [place] practice Office: [phone]

## 2022-11-15 ENCOUNTER — Encounter (HOSPITAL_COMMUNITY)
Admission: RE | Admit: 2022-11-15 | Discharge: 2022-11-15 | Disposition: A | Discharge status: Home / Self Care | Payer: Commercial Managed Care - PPO | Source: Ambulatory Visit | Attending: Surgery | Admitting: Surgery

## 2022-11-15 ENCOUNTER — Encounter (HOSPITAL_COMMUNITY): Payer: Self-pay

## 2022-11-15 VITALS — BP 142/85 | HR 85 | Temp 98.8°F | Resp 16 | Ht 69.0 in | Wt 286.0 lb

## 2022-11-15 DIAGNOSIS — Z01812 Encounter for preprocedural laboratory examination: Secondary | ICD-10-CM | POA: Insufficient documentation

## 2022-11-15 DIAGNOSIS — D509 Iron deficiency anemia, unspecified: Secondary | ICD-10-CM | POA: Diagnosis not present

## 2022-11-15 HISTORY — DX: Malignant (primary) neoplasm, unspecified: C80.1

## 2022-11-15 LAB — CBC
HCT: 35.9 % — ABNORMAL LOW (ref 36.0–46.0)
Hemoglobin: 10.2 g/dL — ABNORMAL LOW (ref 12.0–15.0)
MCH: 20.7 pg — ABNORMAL LOW (ref 26.0–34.0)
MCHC: 28.4 g/dL — ABNORMAL LOW (ref 30.0–36.0)
MCV: 73 fL — ABNORMAL LOW (ref 80.0–100.0)
Platelets: 364 10*3/uL (ref 150–400)
RBC: 4.92 MIL/uL (ref 3.87–5.11)
RDW: 17.3 % — ABNORMAL HIGH (ref 11.5–15.5)
WBC: 7.8 10*3/uL (ref 4.0–10.5)
nRBC: 0 % (ref 0.0–0.2)

## 2022-11-15 NOTE — Progress Notes (Signed)
[facility] triage and spoke with Linda Wilson regarding liver biopsy not being in surgical consent order but being in surgical case description. She stated she will follow up with Dr. Harlow Asa once he is out of surgery today.

## 2022-11-15 NOTE — Progress Notes (Addendum)
COVID Vaccine Completed: yes  Date of COVID positive in last 58 days:no  PCP - Evelina Dun, FNP Cardiologist - n/a  Chest x-ray - 11/01/22 Epic EKG - 11/02/22 Epic Stress Test - n/a ECHO - n/a Cardiac Cath - n/a Pacemaker/ICD device last checked: n/a Spinal Cord Stimulator: n/a  Bowel Prep - no  Sleep Study - n/a CPAP -   Fasting Blood Sugar - n/a Checks Blood Sugar _____ times a day  Last dose of GLP1 agonist-  N/A GLP1 instructions:  N/A   Last dose of SGLT-2 inhibitors-  N/A SGLT-2 instructions: N/A   Blood Thinner Instructions: n/a Aspirin Instructions: Last Dose:  Activity level: Can go up a flight of stairs and perform activities of daily living without stopping and without symptoms of chest pain or shortness of breath.   Anesthesia review:   Patient denies shortness of breath, fever, cough and chest pain at PAT appointment  Patient verbalized understanding of instructions that were given to them at the PAT appointment. Patient was also instructed that they will need to review over the PAT instructions again at home before surgery.

## 2022-11-15 NOTE — Patient Instructions (Addendum)
SURGICAL WAITING ROOM VISITATION  Patients having surgery or a procedure may have no more than 2 support people in the waiting area - these visitors may rotate.    Children under the age of 3 must have an adult with them who is not the patient.  Due to an increase in RSV and influenza rates and associated hospitalizations, children ages 54 and under may not visit patients in [place].  If the patient needs to stay at the hospital during part of their recovery, the visitor guidelines for inpatient rooms apply. Pre-op nurse will coordinate an appropriate time for 1 support person to accompany patient in pre-op.  This support person may not rotate.    Please refer to the [facility] website for the visitor guidelines for Inpatients (after your surgery is over and you are in a regular room).    Your procedure is scheduled on: 11/17/22   Report to [facility] Main Entrance    Report to admitting at 7:30 AM   Call this number if you have problems the morning of surgery [phone]   Do not eat food :After Midnight.   After Midnight you may have the following liquids until 6:45 AM DAY OF SURGERY  Water Non-Citrus Juices (without pulp, NO RED-Apple, White grape, White cranberry) Black Coffee (NO MILK/CREAM OR CREAMERS, sugar ok)  Clear Tea (NO MILK/CREAM OR CREAMERS, sugar ok) regular and decaf                             Plain Jell-O (NO RED)                                           Fruit ices (not with fruit pulp, NO RED)                                     Popsicles (NO RED)                                                               Sports drinks like Gatorade (NO RED)                  The day of surgery:  Drink ONE (1) Pre-Surgery Clear Ensure at 6:45 AM the morning of surgery. Drink in one sitting. Do not sip.  This drink was given to you during your hospital  pre-op appointment visit. Nothing else to drink after completing the  Pre-Surgery Clear  Ensure.          If you have questions, please contact your surgeon's office.   FOLLOW BOWEL PREP AND ANY ADDITIONAL PRE OP INSTRUCTIONS YOU RECEIVED FROM YOUR SURGEON'S OFFICE!!!     Oral Hygiene is also important to reduce your risk of infection.                                    Remember - BRUSH YOUR TEETH THE MORNING OF SURGERY WITH YOUR REGULAR TOOTHPASTE  DENTURES  WILL BE REMOVED PRIOR TO SURGERY PLEASE DO NOT APPLY "Poly grip" OR ADHESIVES!!!   Take these medicines the morning of surgery with A SIP OF WATER: Claritin                               You may not have any metal on your body including hair pins, jewelry, and body piercing             Do not wear make-up, lotions, powders, perfumes, or deodorant  Do not wear nail polish including gel and S&S, artificial/acrylic nails, or any other type of covering on natural nails including finger and toenails. If you have artificial nails, gel coating, etc. that needs to be removed by a nail salon please have this removed prior to surgery or surgery may need to be canceled/ delayed if the surgeon/ anesthesia feels like they are unable to be safely monitored.   Do not shave  48 hours prior to surgery.    Do not bring valuables to the hospital. [place].   Contacts, glasses, dentures or bridgework may not be worn into surgery.   Bring small overnight bag day of surgery.   DO NOT [place]. PHARMACY WILL DISPENSE MEDICATIONS LISTED ON YOUR MEDICATION LIST TO YOU DURING YOUR ADMISSION [place]!              Please read over the following fact sheets you were given: IF [place] [phone] Schneiders    If you received a COVID test during your pre-op visit  it is requested that you wear a mask when out in public, stay away from anyone that may not be feeling well and notify your surgeon if you  develop symptoms. If you test positive for Covid or have been in contact with anyone that has tested positive in the last 10 days please notify you surgeon.    [place] - Preparing for Surgery Before surgery, you can play an important role.  Because skin is not sterile, your skin needs to be as free of germs as possible.  You can reduce the number of germs on your skin by washing with CHG (chlorahexidine gluconate) soap before surgery.  CHG is an antiseptic cleaner which kills germs and bonds with the skin to continue killing germs even after washing. Please DO NOT use if you have an allergy to CHG or antibacterial soaps.  If your skin becomes reddened/irritated stop using the CHG and inform your nurse when you arrive at Short Stay. Do not shave (including legs and underarms) for at least 48 hours prior to the first CHG shower.  You may shave your face/neck.  Please follow these instructions carefully:  1.  Shower with CHG Soap the night before surgery and the  morning of surgery.  2.  If you choose to wash your hair, wash your hair first as usual with your normal  shampoo.  3.  After you shampoo, rinse your hair and body thoroughly to remove the shampoo.                             4.  Use CHG as you would any other liquid soap.  You can apply  chg directly to the skin and wash.  Gently with a scrungie or clean washcloth.  5.  Apply the CHG Soap to your body ONLY FROM THE NECK DOWN.   Do   not use on face/ open                           Wound or open sores. Avoid contact with eyes, ears mouth and   genitals (private parts).                       Wash face,  Genitals (private parts) with your normal soap.             6.  Wash thoroughly, paying special attention to the area where your    surgery  will be performed.  7.  Thoroughly rinse your body with warm water from the neck down.  8.  DO NOT shower/wash with your normal soap after using and rinsing off the CHG Soap.                9.  Pat  yourself dry with a clean towel.            10.  Wear clean pajamas.            11.  Place clean sheets on your bed the night of your first shower and do not  sleep with pets. Day of Surgery : Do not apply any lotions/deodorants the morning of surgery.  Please wear clean clothes to the hospital/surgery center.  FAILURE TO FOLLOW THESE INSTRUCTIONS MAY RESULT IN THE CANCELLATION OF YOUR SURGERY  PATIENT SIGNATURE_________________________________  NURSE SIGNATURE__________________________________  ________________________________________________________________________

## 2022-11-16 NOTE — Anesthesia Preprocedure Evaluation (Signed)
Anesthesia Evaluation  Patient identified by MRN, date of birth, ID band Patient awake    Reviewed: Allergy & Precautions, NPO status , Patient's Chart, lab work & pertinent test results  Airway Mallampati: III  TM Distance: >3 FB Neck ROM: Full    Dental no notable dental hx. (+) Teeth Intact, Dental Advisory Given   Pulmonary neg pulmonary ROS   Pulmonary exam normal breath sounds clear to auscultation       Cardiovascular Exercise Tolerance: Good Normal cardiovascular exam Rhythm:Regular Rate:Normal     Neuro/Psych negative neurological ROS  negative psych ROS   GI/Hepatic   Endo/Other    Renal/GU Renal diseaseLab Results      Component                Value               Date                      CREATININE               0.72                11/02/2022                BUN                      <5 (L)              11/02/2022                NA                       139                 11/02/2022                K                        3.7                 11/02/2022                    Musculoskeletal  (+) Arthritis ,    Abdominal  (+) + obese  Peds  Hematology Lab Results      Component                Value               Date                      WBC                      7.8                 11/15/2022                HGB                      10.2 (L)            11/15/2022                HCT                      35.9 (L)  11/15/2022                MCV                      73.0 (L)            11/15/2022                PLT                      364                 11/15/2022              Anesthesia Other Findings   Reproductive/Obstetrics                             Anesthesia Physical Anesthesia Plan  ASA: 3  Anesthesia Plan: General   Post-op Pain Management: Toradol IV (intra-op)*, Ofirmev IV (intra-op)* and Precedex   Induction: Intravenous  PONV Risk Score and Plan: 4  or greater and Treatment may vary due to age or medical condition, Midazolam, Ondansetron and Dexamethasone  Airway Management Planned: Oral ETT and Video Laryngoscope Planned  Additional Equipment: None  Intra-op Plan:   Post-operative Plan: Extubation in OR  Informed Consent: I have reviewed the patients History and Physical, chart, labs and discussed the procedure including the risks, benefits and alternatives for the proposed anesthesia with the patient or authorized representative who has indicated his/her understanding and acceptance.     Dental advisory given  Plan Discussed with: CRNA and Surgeon  Anesthesia Plan Comments:         Anesthesia Quick Evaluation

## 2022-11-17 ENCOUNTER — Encounter (HOSPITAL_COMMUNITY): Payer: Self-pay | Admitting: Surgery

## 2022-11-17 ENCOUNTER — Ambulatory Visit (HOSPITAL_BASED_OUTPATIENT_CLINIC_OR_DEPARTMENT_OTHER): Payer: Commercial Managed Care - PPO | Admitting: Anesthesiology

## 2022-11-17 ENCOUNTER — Other Ambulatory Visit: Payer: Self-pay

## 2022-11-17 ENCOUNTER — Ambulatory Visit (HOSPITAL_COMMUNITY): Payer: Commercial Managed Care - PPO | Admitting: Anesthesiology

## 2022-11-17 ENCOUNTER — Ambulatory Visit (HOSPITAL_COMMUNITY)
Admission: RE | Admit: 2022-11-17 | Discharge: 2022-11-17 | Disposition: A | Discharge status: Home / Self Care | Payer: Commercial Managed Care - PPO | Attending: Surgery | Admitting: Surgery

## 2022-11-17 ENCOUNTER — Other Ambulatory Visit (HOSPITAL_COMMUNITY): Payer: Self-pay

## 2022-11-17 ENCOUNTER — Encounter (HOSPITAL_COMMUNITY)
Admission: RE | Disposition: A | Discharge status: Home / Self Care | Payer: Self-pay | Source: Home / Self Care | Attending: Surgery

## 2022-11-17 ENCOUNTER — Ambulatory Visit (HOSPITAL_COMMUNITY): Payer: Commercial Managed Care - PPO

## 2022-11-17 DIAGNOSIS — K7469 Other cirrhosis of liver: Secondary | ICD-10-CM | POA: Diagnosis not present

## 2022-11-17 DIAGNOSIS — Z6841 Body Mass Index (BMI) 40.0 and over, adult: Secondary | ICD-10-CM

## 2022-11-17 DIAGNOSIS — E669 Obesity, unspecified: Secondary | ICD-10-CM | POA: Diagnosis not present

## 2022-11-17 DIAGNOSIS — K819 Cholecystitis, unspecified: Secondary | ICD-10-CM | POA: Diagnosis not present

## 2022-11-17 DIAGNOSIS — K7460 Unspecified cirrhosis of liver: Secondary | ICD-10-CM | POA: Insufficient documentation

## 2022-11-17 DIAGNOSIS — K7581 Nonalcoholic steatohepatitis (NASH): Secondary | ICD-10-CM | POA: Insufficient documentation

## 2022-11-17 DIAGNOSIS — K811 Chronic cholecystitis: Secondary | ICD-10-CM | POA: Diagnosis not present

## 2022-11-17 DIAGNOSIS — M1990 Unspecified osteoarthritis, unspecified site: Secondary | ICD-10-CM | POA: Diagnosis not present

## 2022-11-17 DIAGNOSIS — N289 Disorder of kidney and ureter, unspecified: Secondary | ICD-10-CM | POA: Diagnosis not present

## 2022-11-17 DIAGNOSIS — K760 Fatty (change of) liver, not elsewhere classified: Secondary | ICD-10-CM | POA: Diagnosis not present

## 2022-11-17 DIAGNOSIS — Z9049 Acquired absence of other specified parts of digestive tract: Secondary | ICD-10-CM | POA: Diagnosis not present

## 2022-11-17 DIAGNOSIS — K8010 Calculus of gallbladder with chronic cholecystitis without obstruction: Secondary | ICD-10-CM | POA: Diagnosis not present

## 2022-11-17 DIAGNOSIS — R1010 Upper abdominal pain, unspecified: Secondary | ICD-10-CM | POA: Diagnosis present

## 2022-11-17 DIAGNOSIS — K828 Other specified diseases of gallbladder: Secondary | ICD-10-CM | POA: Diagnosis present

## 2022-11-17 DIAGNOSIS — D509 Iron deficiency anemia, unspecified: Secondary | ICD-10-CM

## 2022-11-17 HISTORY — PX: LIVER BIOPSY: SHX301

## 2022-11-17 HISTORY — PX: CHOLECYSTECTOMY: SHX55

## 2022-11-17 SURGERY — LAPAROSCOPIC CHOLECYSTECTOMY WITH INTRAOPERATIVE CHOLANGIOGRAM
Anesthesia: General

## 2022-11-17 MED ORDER — IOHEXOL 300 MG/ML  SOLN
INTRAMUSCULAR | Status: DC | PRN
  Administered 2022-11-17: 5 mL

## 2022-11-17 MED ORDER — TRAMADOL HCL 50 MG PO TABS
50.0000 mg | ORAL_TABLET | Freq: Four times a day (QID) | ORAL | 0 refills | Status: DC | PRN
  Filled 2022-11-17: qty 15, 4d supply, fill #0

## 2022-11-17 MED ORDER — ONDANSETRON 4 MG PO TBDP
4.0000 mg | ORAL_TABLET | Freq: Once | ORAL | Status: AC
  Administered 2022-11-17: 4 mg via ORAL

## 2022-11-17 MED ORDER — ONDANSETRON 4 MG PO TBDP
ORAL_TABLET | ORAL | Status: AC
  Filled 2022-11-17: qty 1

## 2022-11-17 MED ORDER — EPHEDRINE 5 MG/ML INJ
INTRAVENOUS | Status: AC
  Filled 2022-11-17: qty 5

## 2022-11-17 MED ORDER — CHLORHEXIDINE GLUCONATE CLOTH 2 % EX PADS: 6.0000 | MEDICATED_PAD | Freq: Once | CUTANEOUS | Status: DC

## 2022-11-17 MED ORDER — CHLORHEXIDINE GLUCONATE 0.12 % MT SOLN
15.0000 mL | Freq: Once | OROMUCOSAL | Status: AC
  Administered 2022-11-17: 15 mL via OROMUCOSAL

## 2022-11-17 MED ORDER — DEXMEDETOMIDINE HCL IN NACL 80 MCG/20ML IV SOLN
INTRAVENOUS | Status: DC | PRN
  Administered 2022-11-17: 16 ug via BUCCAL

## 2022-11-17 MED ORDER — ROCURONIUM BROMIDE 10 MG/ML (PF) SYRINGE
PREFILLED_SYRINGE | INTRAVENOUS | Status: AC
  Filled 2022-11-17: qty 10

## 2022-11-17 MED ORDER — LIDOCAINE 2% (20 MG/ML) 5 ML SYRINGE
INTRAMUSCULAR | Status: DC | PRN
  Administered 2022-11-17: 100 mg via INTRAVENOUS

## 2022-11-17 MED ORDER — OXYCODONE HCL 5 MG PO TABS: 5.0000 mg | ORAL_TABLET | Freq: Once | ORAL | Status: AC | PRN

## 2022-11-17 MED ORDER — ONDANSETRON HCL 4 MG/2ML IJ SOLN
INTRAMUSCULAR | Status: AC
  Filled 2022-11-17: qty 2

## 2022-11-17 MED ORDER — DEXAMETHASONE SODIUM PHOSPHATE 10 MG/ML IJ SOLN
INTRAMUSCULAR | Status: DC | PRN
  Administered 2022-11-17: 10 mg via INTRAVENOUS

## 2022-11-17 MED ORDER — HYDROMORPHONE HCL 1 MG/ML IJ SOLN
0.2500 mg | INTRAMUSCULAR | Status: DC | PRN
  Administered 2022-11-17 (×4): 0.5 mg via INTRAVENOUS

## 2022-11-17 MED ORDER — FENTANYL CITRATE (PF) 100 MCG/2ML IJ SOLN
INTRAMUSCULAR | Status: AC
  Filled 2022-11-17: qty 2

## 2022-11-17 MED ORDER — BUPIVACAINE-EPINEPHRINE (PF) 0.25% -1:200000 IJ SOLN
INTRAMUSCULAR | Status: DC | PRN
  Administered 2022-11-17: 30 mL

## 2022-11-17 MED ORDER — ROCURONIUM BROMIDE 100 MG/10ML IV SOLN
INTRAVENOUS | Status: DC | PRN
  Administered 2022-11-17: 60 mg via INTRAVENOUS

## 2022-11-17 MED ORDER — BUPIVACAINE-EPINEPHRINE (PF) 0.25% -1:200000 IJ SOLN
INTRAMUSCULAR | Status: AC
  Filled 2022-11-17: qty 30

## 2022-11-17 MED ORDER — OXYCODONE HCL 5 MG/5ML PO SOLN: 5.0000 mg | Freq: Once | ORAL | Status: AC | PRN

## 2022-11-17 MED ORDER — OXYCODONE HCL 5 MG PO TABS
ORAL_TABLET | ORAL | Status: AC
  Administered 2022-11-17: 5 mg via ORAL
  Filled 2022-11-17: qty 1

## 2022-11-17 MED ORDER — 0.9 % SODIUM CHLORIDE (POUR BTL) OPTIME
TOPICAL | Status: DC | PRN
  Administered 2022-11-17: 1000 mL

## 2022-11-17 MED ORDER — PROPOFOL 10 MG/ML IV BOLUS
INTRAVENOUS | Status: DC | PRN
  Administered 2022-11-17: 180 mg via INTRAVENOUS

## 2022-11-17 MED ORDER — MIDAZOLAM HCL 2 MG/2ML IJ SOLN
INTRAMUSCULAR | Status: AC
  Filled 2022-11-17: qty 2

## 2022-11-17 MED ORDER — ONDANSETRON HCL 4 MG/2ML IJ SOLN
INTRAMUSCULAR | Status: DC | PRN
  Administered 2022-11-17: 4 mg via INTRAVENOUS

## 2022-11-17 MED ORDER — CEFAZOLIN IN SODIUM CHLORIDE 3-0.9 GM/100ML-% IV SOLN
3.0000 g | INTRAVENOUS | Status: AC
  Administered 2022-11-17: 3 g via INTRAVENOUS
  Filled 2022-11-17: qty 100

## 2022-11-17 MED ORDER — SUGAMMADEX SODIUM 200 MG/2ML IV SOLN
INTRAVENOUS | Status: DC | PRN
  Administered 2022-11-17: 200 mg via INTRAVENOUS

## 2022-11-17 MED ORDER — MIDAZOLAM HCL 5 MG/5ML IJ SOLN
INTRAMUSCULAR | Status: DC | PRN
  Administered 2022-11-17: 2 mg via INTRAVENOUS

## 2022-11-17 MED ORDER — ONDANSETRON HCL 4 MG/2ML IJ SOLN: 4.0000 mg | Freq: Once | INTRAMUSCULAR | Status: DC | PRN

## 2022-11-17 MED ORDER — KETOROLAC TROMETHAMINE 30 MG/ML IJ SOLN
30.0000 mg | Freq: Once | INTRAMUSCULAR | Status: AC | PRN
  Administered 2022-11-17: 30 mg via INTRAVENOUS

## 2022-11-17 MED ORDER — KETOROLAC TROMETHAMINE 30 MG/ML IJ SOLN
INTRAMUSCULAR | Status: AC
  Filled 2022-11-17: qty 1

## 2022-11-17 MED ORDER — FENTANYL CITRATE (PF) 100 MCG/2ML IJ SOLN
INTRAMUSCULAR | Status: DC | PRN
  Administered 2022-11-17 (×2): 50 ug via INTRAVENOUS
  Administered 2022-11-17: 100 ug via INTRAVENOUS

## 2022-11-17 MED ORDER — LACTATED RINGERS IR SOLN
Status: DC | PRN
  Administered 2022-11-17: 1000 mL

## 2022-11-17 MED ORDER — ORAL CARE MOUTH RINSE: 15.0000 mL | Freq: Once | OROMUCOSAL | Status: AC

## 2022-11-17 MED ORDER — LACTATED RINGERS IV SOLN: INTRAVENOUS | Status: DC

## 2022-11-17 MED ORDER — HYDROMORPHONE HCL 1 MG/ML IJ SOLN
INTRAMUSCULAR | Status: AC
  Filled 2022-11-17: qty 2

## 2022-11-17 MED ORDER — DEXAMETHASONE SODIUM PHOSPHATE 10 MG/ML IJ SOLN
INTRAMUSCULAR | Status: AC
  Filled 2022-11-17: qty 1

## 2022-11-17 MED ORDER — PROPOFOL 10 MG/ML IV BOLUS
INTRAVENOUS | Status: AC
  Filled 2022-11-17: qty 20

## 2022-11-17 MED ORDER — ROCURONIUM BROMIDE 10 MG/ML (PF) SYRINGE
PREFILLED_SYRINGE | INTRAVENOUS | Status: DC | PRN
  Administered 2022-11-17: 60 mg via INTRAVENOUS

## 2022-11-17 SURGICAL SUPPLY — 44 items
ADH SKN CLS APL DERMABOND .7 (GAUZE/BANDAGES/DRESSINGS) ×1
APL PRP STRL LF DISP 70% ISPRP (MISCELLANEOUS) ×1
APPLIER CLIP ROT 10 11.4 M/L (STAPLE) ×1
APR CLP MED LRG 11.4X10 (STAPLE) ×1
BAG COUNTER SPONGE SURGICOUNT (BAG) IMPLANT
BAG SPNG CNTER NS LX DISP (BAG)
CABLE HIGH FREQUENCY MONO STRZ (ELECTRODE) ×1 IMPLANT
CHLORAPREP W/TINT 26 (MISCELLANEOUS) ×2 IMPLANT
CLIP APPLIE ROT 10 11.4 M/L (STAPLE) ×1 IMPLANT
COVER MAYO STAND XLG (MISCELLANEOUS) ×1 IMPLANT
COVER SURGICAL LIGHT HANDLE (MISCELLANEOUS) ×1 IMPLANT
DERMABOND ADVANCED .7 DNX12 (GAUZE/BANDAGES/DRESSINGS) IMPLANT
DRAPE C-ARM 42X120 X-RAY (DRAPES) ×1 IMPLANT
DRSG TELFA 3X8 NADH STRL (GAUZE/BANDAGES/DRESSINGS) IMPLANT
ELECT PENCIL ROCKER SW 15FT (MISCELLANEOUS) IMPLANT
ELECT REM PT RETURN 15FT ADLT (MISCELLANEOUS) ×1 IMPLANT
GAUZE SPONGE 2X2 STRL 8-PLY (GAUZE/BANDAGES/DRESSINGS) ×1 IMPLANT
GLOVE SURG ORTHO 8.0 STRL STRW (GLOVE) ×1 IMPLANT
GLOVE SURG SYN 7.5  E (GLOVE) ×2
GLOVE SURG SYN 7.5 E (GLOVE) ×2 IMPLANT
GLOVE SURG SYN 7.5 PF PI (GLOVE) ×2 IMPLANT
GOWN STRL REUS W/ TWL XL LVL3 (GOWN DISPOSABLE) ×2 IMPLANT
GOWN STRL REUS W/TWL XL LVL3 (GOWN DISPOSABLE) ×2
HEMOSTAT SURGICEL 4X8 (HEMOSTASIS) IMPLANT
IRRIG SUCT STRYKERFLOW 2 WTIP (MISCELLANEOUS) ×1
IRRIGATION SUCT STRKRFLW 2 WTP (MISCELLANEOUS) ×1 IMPLANT
KIT BASIN OR (CUSTOM PROCEDURE TRAY) ×1 IMPLANT
KIT TURNOVER KIT A (KITS) IMPLANT
PENCIL SMOKE EVACUATOR (MISCELLANEOUS) IMPLANT
SCISSORS LAP 5X35 DISP (ENDOMECHANICALS) ×1 IMPLANT
SET CHOLANGIOGRAPH MIX (MISCELLANEOUS) ×1 IMPLANT
SET TUBE SMOKE EVAC HIGH FLOW (TUBING) IMPLANT
SLEEVE Z-THREAD 5X100MM (TROCAR) ×1 IMPLANT
SPIKE FLUID TRANSFER (MISCELLANEOUS) ×1 IMPLANT
STRIP CLOSURE SKIN 1/2X4 (GAUZE/BANDAGES/DRESSINGS) IMPLANT
SUT MNCRL AB 4-0 PS2 18 (SUTURE) ×1 IMPLANT
SYS BAG RETRIEVAL 10MM (BASKET) ×1
SYSTEM BAG RETRIEVAL 10MM (BASKET) ×1 IMPLANT
TOWEL OR 17X26 10 PK STRL BLUE (TOWEL DISPOSABLE) ×1 IMPLANT
TOWEL OR NON WOVEN STRL DISP B (DISPOSABLE) ×1 IMPLANT
TRAY LAPAROSCOPIC (CUSTOM PROCEDURE TRAY) ×1 IMPLANT
TROCAR 11X100 Z THREAD (TROCAR) ×1 IMPLANT
TROCAR BALLN 12MMX100 BLUNT (TROCAR) ×1 IMPLANT
TROCAR Z-THREAD OPTICAL 5X100M (TROCAR) ×1 IMPLANT

## 2022-11-17 NOTE — Anesthesia Procedure Notes (Signed)
Procedure Name: Intubation Date/Time: 11/17/2022 10:40 AM  Performed by: Gerald Leitz, CRNAPre-anesthesia Checklist: Patient identified, Patient being monitored, Timeout performed, Emergency Drugs available and Suction available Patient Re-evaluated:Patient Re-evaluated prior to induction Oxygen Delivery Method: Circle system utilized Preoxygenation: Pre-oxygenation with 100% oxygen Induction Type: IV induction Ventilation: Mask ventilation without difficulty Laryngoscope Size: Mac, 3 and Glidescope Grade View: Grade I Tube type: Oral Tube size: 7.0 mm Number of attempts: 1 Airway Equipment and Method: Stylet Placement Confirmation: ETT inserted through vocal cords under direct vision, positive ETCO2 and breath sounds checked- equal and bilateral Secured at: 22 cm Tube secured with: Tape Dental Injury: Teeth and Oropharynx as per pre-operative assessment

## 2022-11-17 NOTE — Discharge Instructions (Addendum)
[facility]  LAPAROSCOPIC SURGERY:  POST-OP INSTRUCTIONS  Always review your discharge instruction sheet given to you by the facility where your surgery was performed.  A prescription for pain medication may be given to you upon discharge.  Take your pain medication as prescribed.  If narcotic pain medicine is not needed, then you may take acetaminophen (Tylenol) or ibuprofen (Advil) as needed.  Take your usually prescribed medications unless otherwise directed.  If you need a refill on your pain medication, please contact your pharmacy.  They will contact our office to request authorization. Prescriptions will not be filled after 5 P.M. or on weekends.  You should follow a light diet the first few days after arrival home, such as soup and crackers or toast.  Be sure to include plenty of fluids daily.  Most patients will experience some swelling and bruising in the area of the incisions.  Ice packs will help.  Swelling and bruising can take several days to resolve.   It is common to experience some constipation after surgery.  Increasing fluid intake and taking a stool softener (such as Colace) will usually help or prevent this problem from occurring.  A mild laxative (Milk of Magnesia or Miralax) should be taken according to package instructions if there has been no bowel movement after 48 hours.  You will likely have Dermabond (topical glue) over your incisions.  This seals the incisions and allows you to bathe and shower at any time after your surgery.  Glue should remain in place for up to 10 days.  It may be removed after 10 days by pealing off the Dermabond material or using Vaseline or naval jelly to remove.  If you have steri-strips over your incisions, you may remove the gauze bandage on the second day after surgery, and you may shower at that time.  Leave your steri-strips (small skin tapes) in place directly over the incision.  These strips should remain on the  skin for 5-7 days and then be removed.  You may get them wet in the shower and pat them dry.  Any sutures or staples will be removed at the office during your follow-up visit.  ACTIVITIES:  You may resume regular (light) daily activities beginning the next day - such as daily self-care, walking, climbing stairs - gradually increasing activities as tolerated.  You may have sexual intercourse when it is comfortable.  Refrain from any heavy lifting or straining until approved by your doctor.  You may drive when you are no longer taking prescription pain medication, when you can comfortably wear a seatbelt, and when you can safely maneuver your car and apply brakes.  You should see your doctor in the office for a follow-up appointment approximately 2-3 weeks after your surgery.  Make sure that you call for this appointment within a day or two after you arrive home to insure a convenient appointment time.  WHEN TO CALL YOUR DOCTOR: Fever over 101.0 Inability to urinate Continued bleeding from incision Increased pain, redness, or drainage from the incision Increasing abdominal pain  The clinic staff is available to answer your questions during regular business hours.  Please don't hesitate to call and ask to speak to one of the nurses for clinical concerns.  If you have a medical emergency, go to the nearest emergency room or call 911.  A surgeon from [facility] is always on call for the hospital.  Armandina Gemma, [facility] Office: [phone] Toll Free:  [phone] FAX [phone]  Website: [email or web address]

## 2022-11-17 NOTE — Op Note (Signed)
Procedure Note  Pre-operative Diagnosis:  chronic cholecystitis, abdominal pain, hepatic steatosis  Post-operative Diagnosis:  same; hepatic cirrhosis  Surgeon:  Armandina Gemma, MD  Assistant:  none   Procedure:  Laparoscopic cholecystectomy with intra-operative cholangiography; wedge biopsy right lobe of liver  Anesthesia:  General  Estimated Blood Loss:  minimal  Drains: none         Specimen: gallbladder to pathology  Indications:  Patient presents for surgical evaluation of right upper quadrant abdominal pain with signs and symptoms of acute cholecystitis. Patient initially developed symptoms in late January. She was hospitalized and underwent surgical consultation. CT scan of the abdomen and pelvis demonstrated evidence of inflammatory changes around the gallbladder consistent with cholecystitis. No gallstones were seen. Ultrasound demonstrated sludge within the gallbladder but no gallstones. There was hepatic steatosis. Patient underwent nuclear medicine hepatobiliary scan which showed a patent cystic duct and common bile duct without evidence of acute cholecystitis. Patient was discharged home but had recurrent symptoms. These seem to be exacerbated by solid food. Patient was readmitted to the hospital and again underwent a CT scan and an ultrasound on November 01, 2022. These again demonstrated sludge within the gallbladder and raise the possibility of hepatic cirrhosis. Liver enzymes have been essentially normal. Total bilirubin was transiently elevated. Platelet count is normal. Patient underwent upper endoscopy by gastroenterology which was essentially a normal study. Patient now continues to have persistent symptoms of right upper quadrant abdominal pain particularly following meals. She presents today for surgical assessment for consideration for cholecystectomy.   Procedure description: The patient was seen in the pre-op holding area. The risks, benefits, complications, treatment  options, and expected outcomes were previously discussed with the patient. The patient agreed with the proposed plan and has signed the informed consent form.  The patient was transported to operating room #3 at the [facility]. The patient was placed in the supine position on the operating room table. Following induction of general anesthesia, the abdomen was prepped and draped in the usual aseptic fashion.  An incision was made in the skin near the umbilicus. The midline fascia was incised and the peritoneal cavity was entered and a Hasson cannula was introduced under direct vision. The cannula was secured with a 0-Vicryl pursestring suture. Pneumoperitoneum was established with carbon dioxide. Additional cannulae were introduced under direct vision along the right costal margin in the midline, mid-clavicular line, and anterior axillary line.   The gallbladder was identified and the fundus grasped and retracted cephalad. The liver was enlarged and diffusely nodular consistent with cirrhotic changes.  Adhesions were taken down bluntly and the electrocautery was utilized as needed, taking care not to involve any adjacent structures. The infundibulum was grasped and retracted laterally, exposing the peritoneum overlying the triangle of Calot. The peritoneum was incised and structures exposed with blunt dissection. The cystic duct was clearly identified, bluntly dissected circumferentially, and clipped at the neck of the gallbladder.  An incision was made in the cystic duct and the cholangiogram catheter introduced. The catheter was secured using an ligaclip.  Real-time cholangiography was performed using C-arm fluoroscopy.  There was rapid filling of a normal caliber common bile duct.  There was reflux of contrast into the left and right hepatic ductal systems.  There was free flow distally into the duodenum without filling defect or obstruction.  The catheter was removed from the peritoneal  cavity.  The cystic duct was then ligated with ligaclips and divided. The cystic artery was identified, dissected circumferentially, ligated  with ligaclips, and divided.  The gallbladder was dissected away from the gallbladder bed using the electrocautery for hemostasis. The gallbladder was completely removed from the liver and placed into an endocatch bag. The gallbladder was removed in the endocatch bag through the umbilical port site and submitted to pathology for review.  Using the electrocautery, a wedge biopsy of the right lobe of the liver was performed just lateral to the gallbladder bed.  Specimen was submitted to pathology.  Hemostasis was obtained with the cautery.  The right upper quadrant was irrigated and the gallbladder bed was inspected. Hemostasis was achieved with the electrocautery.  Cannulae were removed under direct vision and good hemostasis was noted. Pneumoperitoneum was released and the majority of the carbon dioxide evacuated. The umbilical wound was irrigated and the fascia was then closed with the pursestring suture.  Local anesthetic was infiltrated at all port sites. Skin incisions were closed with 4-0 Monocril subcuticular sutures and Dermabond was applied.  Instrument, sponge, and needle counts were correct at the conclusion of the case.  The patient was awakened from anesthesia and brought to the recovery room in stable condition.  The patient tolerated the procedure well.   Armandina Gemma, [facility] Office: [phone]

## 2022-11-17 NOTE — Interval H&P Note (Signed)
History and Physical Interval Note:  11/17/2022 9:51 AM  Linda Wilson  has presented today for surgery, with the diagnosis of CHOLECYSTITIS, HEPATIC STEATOSIS.  The various methods of treatment have been discussed with the patient and family. After consideration of risks, benefits and other options for treatment, the patient has consented to    Procedure(s): LAPAROSCOPIC CHOLECYSTECTOMY WITH INTRAOPERATIVE CHOLANGIOGRAM (N/A) LIVER BIOPSY (N/A) as a surgical intervention.    The patient's history has been reviewed, patient examined, no change in status, stable for surgery.  I have reviewed the patient's chart and labs.  Questions were answered to the patient's satisfaction.    Armandina Gemma, [facility] [place] practice Office: [place]

## 2022-11-17 NOTE — Transfer of Care (Signed)
Immediate Anesthesia Transfer of Care Note  Patient: Linda Wilson  Procedure(s) Performed: Procedure(s): LAPAROSCOPIC CHOLECYSTECTOMY WITH INTRAOPERATIVE CHOLANGIOGRAM (N/A) LIVER BIOPSY (N/A)  Patient Location: PACU  Anesthesia Type:General  Level of Consciousness: Alert, Awake, Oriented  Airway & Oxygen Therapy: Patient Spontanous Breathing  Post-op Assessment: Report given to RN  Post vital signs: Reviewed and stable  Last Vitals:  Vitals:   11/17/22 0739  BP: (!) 179/96  Pulse: 90  Resp: 15  Temp: 36.9 C  SpO2: 0000000    Complications: No apparent anesthesia complications

## 2022-11-18 ENCOUNTER — Encounter (HOSPITAL_COMMUNITY): Payer: Self-pay | Admitting: Surgery

## 2022-11-18 LAB — SURGICAL PATHOLOGY

## 2022-11-18 NOTE — Anesthesia Postprocedure Evaluation (Signed)
Anesthesia Post Note  Patient: Linda Wilson  Procedure(s) Performed: LAPAROSCOPIC CHOLECYSTECTOMY WITH INTRAOPERATIVE CHOLANGIOGRAM LIVER BIOPSY     Patient location during evaluation: PACU Anesthesia Type: General Level of consciousness: awake and alert Pain management: pain level controlled Vital Signs Assessment: post-procedure vital signs reviewed and stable Respiratory status: spontaneous breathing, nonlabored ventilation, respiratory function stable and patient connected to nasal cannula oxygen Cardiovascular status: blood pressure returned to baseline and stable Postop Assessment: no apparent nausea or vomiting Anesthetic complications: no  No notable events documented.  Last Vitals:  Vitals:   11/17/22 1345 11/17/22 1430  BP: (!) 156/66 133/76  Pulse: 82 78  Resp: 12 15  Temp: 36.6 C 36.5 C  SpO2: 93% 93%    Last Pain:  Vitals:   11/17/22 1430  TempSrc:   PainSc: 3    Pain Goal: Patients Stated Pain Goal: 4 (11/17/22 0752)                 Barnet Glasgow

## 2022-11-24 ENCOUNTER — Ambulatory Visit: Payer: Commercial Managed Care - PPO | Admitting: General Surgery

## 2022-11-30 NOTE — Progress Notes (Signed)
Gallbladder pathology as expected.  Liver biopsy shows cirrhosis.  Patient will need evaluation with Dr. Bryan Lemma for follow-up.  Will have printed copy for patient at post op visit.  tmg  Armandina Gemma, [facility] [place] practice Office: [phone]

## 2022-12-01 ENCOUNTER — Ambulatory Visit: Payer: Commercial Managed Care - PPO | Admitting: Gastroenterology

## 2022-12-03 ENCOUNTER — Telehealth: Payer: Commercial Managed Care - PPO | Admitting: Nurse Practitioner

## 2022-12-03 DIAGNOSIS — K0889 Other specified disorders of teeth and supporting structures: Secondary | ICD-10-CM

## 2022-12-03 MED ORDER — CLINDAMYCIN HCL 300 MG PO CAPS: 300.0000 mg | ORAL_CAPSULE | Freq: Three times a day (TID) | ORAL | 0 refills | Status: DC

## 2022-12-03 MED ORDER — NAPROXEN 500 MG PO TABS: 500.0000 mg | ORAL_TABLET | Freq: Two times a day (BID) | ORAL | 0 refills | Status: DC

## 2022-12-03 NOTE — Progress Notes (Signed)
E-Visit for Dental Pain  We are sorry that you are not feeling well.  Here is how we plan to help!  Based on what you have shared with me in the questionnaire, it sounds like you have dental pain  Clindamycin 300mg 3 times a day for 7 days and Naprosyn 500mg 2 times a day for 7 days for discomfort  It is imperative that you see a dentist within 10 days of this eVisit to determine the cause of the dental pain and be sure it is adequately treated  A toothache or tooth pain is caused when the nerve in the root of a tooth or surrounding a tooth is irritated. Dental (tooth) infection, decay, injury, or loss of a tooth are the most common causes of dental pain. Pain may also occur after an extraction (tooth is pulled out). Pain sometimes originates from other areas and radiates to the jaw, thus appearing to be tooth pain.Bacteria growing inside your mouth can contribute to gum disease and dental decay, both of which can cause pain. A toothache occurs from inflammation of the central portion of the tooth called pulp. The pulp contains nerve endings that are very sensitive to pain. Inflammation to the pulp or pulpitis may be caused by dental cavities, trauma, and infection.    HOME CARE:   For toothaches: Over-the-counter pain medications such as acetaminophen or ibuprofen may be used. Take these as directed on the package while you arrange for a dental appointment. Avoid very cold or hot foods, because they may make the pain worse. You may get relief from biting on a cotton ball soaked in oil of cloves. You can get oil of cloves at most drug stores.  For jaw pain:  Aspirin may be helpful for problems in the joint of the jaw in adults. If pain happens every time you open your mouth widely, the temporomandibular joint (TMJ) may be the source of the pain. Yawning or taking a large bite of food may worsen the pain. An appointment with your doctor or dentist will help you find the cause.     GET HELP  RIGHT AWAY IF:  You have a high fever or chills If you have had a recent head or face injury and develop headache, light headedness, nausea, vomiting, or other symptoms that concern you after an injury to your face or mouth, you could have a more serious injury in addition to your dental injury. A facial rash associated with a toothache: This condition may improve with medication. Contact your doctor for them to decide what is appropriate. Any jaw pain occurring with chest pain: Although jaw pain is most commonly caused by dental disease, it is sometimes referred pain from other areas. People with heart disease, especially people who have had stents placed, people with diabetes, or those who have had heart surgery may have jaw pain as a symptom of heart attack or angina. If your jaw or tooth pain is associated with lightheadedness, sweating, or shortness of breath, you should see a doctor as soon as possible. Trouble swallowing or excessive pain or bleeding from gums: If you have a history of a weakened immune system, diabetes, or steroid use, you may be more susceptible to infections. Infections can often be more severe and extensive or caused by unusual organisms. Dental and gum infections in people with these conditions may require more aggressive treatment. An abscess may need draining or IV antibiotics, for example.  MAKE SURE YOU   Understand these instructions.   Will watch your condition. Will get help right away if you are not doing well or get worse.  Thank you for choosing an e-visit.  Your e-visit answers were reviewed by a board certified advanced clinical practitioner to complete your personal care plan. Depending upon the condition, your plan could have included both over the counter or prescription medications.  Please review your pharmacy choice. Make sure the pharmacy is open so you can pick up prescription now. If there is a problem, you may contact your provider through MyChart  messaging and have the prescription routed to another pharmacy.  Your safety is important to us. If you have drug allergies check your prescription carefully.   For the next 24 hours you can use MyChart to ask questions about today's visit, request a non-urgent call back, or ask for a work or school excuse. You will get an email in the next two days asking about your experience. I hope that your e-visit has been valuable and will speed your recovery  Mary-Margaret Treena Cosman, FNP   5-10 minutes spent reviewing and documenting in chart.  

## 2022-12-12 ENCOUNTER — Telehealth: Payer: Self-pay

## 2022-12-12 NOTE — Telephone Encounter (Signed)
-----   Message from [place] sent at 12/12/2022  8:28 AM EDT -----  ----- Message ----- From: Lavena Bullion, DO Sent: 12/07/2022   4:44 PM EDT To: Elias Else, CMA  Thanks for the update on her.  Can try calling her again later this week to see if she would like to schedule an appointment with me.  Thanks. ----- Message ----- From: Elias Else, CMA Sent: 12/07/2022   3:10 PM EDT To: Lavena Bullion, DO  Patient was schedule to see you on 12-01-2022. Looks like she canceled that appointment and has not rescheduled as of today 12-07-2022 ----- Message ----- From: Lavena Bullion, DO Sent: 12/01/2022   7:59 AM EDT To: Howell Pringle, CMA  Patient will need OV with me or one of the APPs for evaluation of new cirrhosis. Thanks.   ----- Message ----- From: Armandina Gemma, MD Sent: 11/30/2022  12:23 PM EST To: Jarvis Newcomer; Sharion Balloon, FNP; #  Gallbladder pathology as expected.  Liver biopsy shows cirrhosis.  Patient will need evaluation with Dr. Bryan Lemma for follow-up.  Will have printed copy for patient at post op visit.  tmg  Armandina Gemma, [facility] [place] practice Office: [phone]

## 2022-12-12 NOTE — Telephone Encounter (Signed)
Called patient and Left VM asking her to call back to make an appointment with Dr. Bryan Lemma.

## 2023-01-24 ENCOUNTER — Other Ambulatory Visit (HOSPITAL_COMMUNITY): Payer: Self-pay

## 2023-01-25 ENCOUNTER — Other Ambulatory Visit (HOSPITAL_COMMUNITY): Payer: Self-pay

## 2023-01-31 ENCOUNTER — Telehealth: Payer: Self-pay

## 2023-01-31 ENCOUNTER — Other Ambulatory Visit (HOSPITAL_COMMUNITY): Payer: Self-pay

## 2023-01-31 DIAGNOSIS — R590 Localized enlarged lymph nodes: Secondary | ICD-10-CM

## 2023-01-31 NOTE — Telephone Encounter (Signed)
-----   Message from Evalee Jefferson, LPN sent at 4/0/9811 10:23 AM EDT ----- In Laura's box. ----- Message ----- From: Orion Modest, RN Sent: 01/31/2023  12:00 AM EDT To: Orion Modest, RN  Pt needs CT abdomen with contrast to evaluate for resolution of mesenteric adenopathy. Order under Dr. Barron Alvine.

## 2023-01-31 NOTE — Telephone Encounter (Signed)
MyChart message sent to patient with reminder. CT order in epic. Secure staff message sent to radiology scheduling to contact patient to set up appt.

## 2023-02-03 NOTE — Telephone Encounter (Signed)
Patient is not able to complete CT at this time. Dr. Barron Alvine will discuss further at time of follow up appt on 02/16/23.

## 2023-02-06 ENCOUNTER — Other Ambulatory Visit (HOSPITAL_COMMUNITY): Payer: Self-pay

## 2023-02-08 ENCOUNTER — Other Ambulatory Visit (HOSPITAL_COMMUNITY): Payer: Self-pay

## 2023-02-13 ENCOUNTER — Other Ambulatory Visit (HOSPITAL_COMMUNITY): Payer: Self-pay

## 2023-02-13 MED ORDER — CLOBETASOL PROPIONATE 0.05 % EX CREA
1.0000 | TOPICAL_CREAM | Freq: Two times a day (BID) | CUTANEOUS | 1 refills | Status: DC
  Filled 2023-02-13: qty 45, 22d supply, fill #0
  Filled 2023-12-08: qty 45, 22d supply, fill #1

## 2023-02-16 ENCOUNTER — Encounter: Payer: Self-pay | Admitting: Gastroenterology

## 2023-02-16 ENCOUNTER — Ambulatory Visit: Payer: Commercial Managed Care - PPO | Admitting: Gastroenterology

## 2023-02-16 VITALS — BP 152/80 | HR 92 | Ht 69.0 in | Wt 287.2 lb

## 2023-02-16 DIAGNOSIS — R590 Localized enlarged lymph nodes: Secondary | ICD-10-CM | POA: Diagnosis not present

## 2023-02-16 DIAGNOSIS — Z1211 Encounter for screening for malignant neoplasm of colon: Secondary | ICD-10-CM | POA: Diagnosis not present

## 2023-02-16 DIAGNOSIS — K811 Chronic cholecystitis: Secondary | ICD-10-CM | POA: Diagnosis not present

## 2023-02-16 NOTE — Patient Instructions (Addendum)
You will be contacted by [facility] Scheduling in the next 2 days to arrange a CT.  The number on your caller ID will be [phone], please answer when they call.  If you have not heard from them in 2 days please call [phone] to schedule.     You have been scheduled for Pre Visit appointment on 9/13/2 at 8:30 AM with a Nurse and Colonoscopy on 07/07/23 at 1:30 PM   _______________________________________________________  If your blood pressure at your visit was 140/90 or greater, please contact your primary care physician to follow up on this. _______________________________________________________  If you are age 71 or older, your body mass index should be between 23-30. Your Body mass index is 42.42 kg/m. If this is out of the aforementioned range listed, please consider follow up with your Primary Care Provider.  _________________________________________________________  The [facility] providers would like to encourage you to use [facility] to communicate with providers for non-urgent requests or questions.  Due to long hold times on the telephone, sending your provider a message by [facility] may be a faster and more efficient way to get a response.  Please allow 48 business hours for a response.  Please remember that this is for non-urgent requests.   Due to recent changes in healthcare laws, you may see the results of your imaging and laboratory studies on MyChart before your provider has had a chance to review them.  We understand that in some cases there may be results that are confusing or concerning to you. Not all laboratory results come back in the same time frame and the provider may be waiting for multiple results in order to interpret others.  Please give [place] 48 hours in order for your provider to thoroughly review all the results before contacting the office for clarification of your results.    Thank you for choosing me and [facility].  Vito Cirigliano,  D.O.

## 2023-02-16 NOTE — Progress Notes (Signed)
Chief Complaint:    Hospital follow-up  HPI:     Patient is a 52 y.o. female with a history of nephrolithiasis, asthma, hepatic steatosis, obesity, presenting to the [facility] for hospital follow-up.   Hospital admission in January for RUQ pain, nausea/vomiting with elevated T. bili. - WBC 13 K - Hgb 10.8 - HD 47, ALT normal.  T. bili 2.1 - CT: Possible cholecystitis - RUQ [place]: Mild GB sludge, no stones or GB wall thickening. - General surgery consulted and recommended HIDA (negative/normal) and outpatient follow-up  She was then readmitted 2/6-8 with abdominal pain, fever, and positive D-dimer. - Albumin 3.4, T. bili 1.4 (downtrending from previous), otherwise normal liver enzymes - WBC 11.7, H/H 10.5/35 with MCV/RDW 71/18 - RUQ [place]: GB sludge, CBD 4.5 mm, mildly nodular liver contour - CT A/P: Abdominal mesenteric adenopathy (new from CT on 10/20/2022).  Splenomegaly - CT angio was negative for PE.  Abdominal ultrasound with nodular appearing liver, GB sludge - 11/02/2022: EGD: Normal - Recommended outpatient repeat CT to evaluate for resolution of mesenteric adenopathy  - 11/17/2022: Laparoscopic cholecystectomy w/ IOC  She presents today for routine follow-up.  Pain has resolved since ccy and not recurred. Feeling well today.   She elected to post bone repeat CT due to recent imaging studies, hospitalization x 2, and surgery.  Otherwise no new labs or abdominal imaging for review since hospitalization.  Review of systems:     No chest pain, no SOB, no fevers, no urinary sx   Past Medical History:  Diagnosis Date   Cancer (HCC)    basal cell   Nephrolithiasis 10/20/2022    Patient's surgical history, family medical history, social history, medications and allergies were all reviewed in Epic    Current Outpatient Medications  Medication Sig Dispense Refill   clindamycin (CLEOCIN) 300 MG capsule Take 1 capsule (300 mg total) by mouth 3 (three) times daily. 30  capsule 0   clobetasol cream (TEMOVATE) 0.05 % Apply 1 Application topically 2 (two) times daily. 45 g 1   Cyclobenzaprine HCl (FLEXERIL PO) Take 10 mg by mouth daily as needed (pain).     glucosamine-chondroitin 500-400 MG tablet Take 1 tablet by mouth daily.     loratadine (CLARITIN) 10 MG tablet Take 10 mg by mouth daily.     naproxen (NAPROSYN) 500 MG tablet Take 1 tablet (500 mg total) by mouth 2 (two) times daily with a meal. 30 tablet 0   ondansetron (ZOFRAN) 4 MG tablet Take 1 tablet (4 mg total) by mouth every 8 (eight) hours as needed for nausea or vomiting. (Patient taking differently: Take 4 mg by mouth daily as needed for nausea or vomiting.) 60 tablet 0   traMADol (ULTRAM) 50 MG tablet Take 1 tablet (50 mg total) by mouth every 6 (six) hours as needed for moderate pain. 15 tablet 0   No current facility-administered medications for this visit.    Physical Exam:     LMP 11/09/2014   GENERAL:  Pleasant female in NAD PSYCH: : Cooperative, normal affect Musculoskeletal:  Normal muscle tone, normal strength NEURO: Alert and oriented x 3, no focal neurologic deficits   IMPRESSION and PLAN:    1) Colon cancer screening Due for age-appropriate, average risk CRC screening - She would like to schedule colonoscopy later this year.  Will place recall for fall 2024  2) Chronic cholecystitis - Feeling much better after cholecystectomy.  No recurrence of abdominal pain, nausea, fever.  3) Mesenteric  adenopathy CT in 10/2022 with upper abdominal mesenteric adenopathy.  Discussed DDx today.  Suspect this is reactive, inflammatory adenopathy.  Discussed role/utility of repeat imaging to ensure resolution, and she would like to postpone to be done in fall 2024 - Will place recall for CT abdomen/pelvis to follow-up on the mesenteric adenopathy to ensure resolution        Shellia Cleverly ,DO, FACG 02/16/2023, 2:48 PM

## 2023-02-27 ENCOUNTER — Encounter: Payer: Self-pay | Admitting: Gastroenterology

## 2023-03-15 ENCOUNTER — Ambulatory Visit (HOSPITAL_COMMUNITY)
Admission: RE | Admit: 2023-03-15 | Discharge: 2023-03-15 | Disposition: A | Discharge status: Home / Self Care | Payer: Commercial Managed Care - PPO | Source: Ambulatory Visit | Attending: Gastroenterology | Admitting: Gastroenterology

## 2023-03-15 DIAGNOSIS — K811 Chronic cholecystitis: Secondary | ICD-10-CM | POA: Diagnosis not present

## 2023-03-15 DIAGNOSIS — C189 Malignant neoplasm of colon, unspecified: Secondary | ICD-10-CM | POA: Diagnosis not present

## 2023-03-15 DIAGNOSIS — R590 Localized enlarged lymph nodes: Secondary | ICD-10-CM | POA: Insufficient documentation

## 2023-03-15 DIAGNOSIS — Z1211 Encounter for screening for malignant neoplasm of colon: Secondary | ICD-10-CM | POA: Insufficient documentation

## 2023-03-15 DIAGNOSIS — N200 Calculus of kidney: Secondary | ICD-10-CM | POA: Diagnosis not present

## 2023-03-15 MED ORDER — IOHEXOL 300 MG/ML  SOLN
100.0000 mL | Freq: Once | INTRAMUSCULAR | Status: AC | PRN
  Administered 2023-03-15: 100 mL via INTRAVENOUS

## 2023-03-22 ENCOUNTER — Other Ambulatory Visit (HOSPITAL_COMMUNITY): Payer: Self-pay

## 2023-03-22 DIAGNOSIS — D485 Neoplasm of uncertain behavior of skin: Secondary | ICD-10-CM | POA: Diagnosis not present

## 2023-03-22 DIAGNOSIS — L409 Psoriasis, unspecified: Secondary | ICD-10-CM | POA: Diagnosis not present

## 2023-03-22 DIAGNOSIS — B078 Other viral warts: Secondary | ICD-10-CM | POA: Diagnosis not present

## 2023-03-22 MED ORDER — CALCIPOTRIENE 0.005 % EX CREA
1.0000 | TOPICAL_CREAM | Freq: Two times a day (BID) | CUTANEOUS | 0 refills | Status: DC
  Filled 2023-03-22: qty 45, 23d supply, fill #0
  Filled 2023-03-22: qty 60, 30d supply, fill #0

## 2023-03-27 ENCOUNTER — Encounter (HOSPITAL_COMMUNITY): Payer: Self-pay

## 2023-03-27 ENCOUNTER — Ambulatory Visit (INDEPENDENT_AMBULATORY_CARE_PROVIDER_SITE_OTHER): Payer: Commercial Managed Care - PPO

## 2023-03-27 ENCOUNTER — Ambulatory Visit (HOSPITAL_COMMUNITY)
Admission: RE | Admit: 2023-03-27 | Discharge: 2023-03-27 | Disposition: A | Discharge status: Home / Self Care | Payer: Commercial Managed Care - PPO | Source: Ambulatory Visit | Attending: Internal Medicine | Admitting: Internal Medicine

## 2023-03-27 ENCOUNTER — Ambulatory Visit (HOSPITAL_COMMUNITY): Payer: Commercial Managed Care - PPO

## 2023-03-27 VITALS — BP 162/87 | HR 87 | Temp 98.4°F | Resp 18

## 2023-03-27 DIAGNOSIS — M25531 Pain in right wrist: Secondary | ICD-10-CM

## 2023-03-27 MED ORDER — PREDNISONE 20 MG PO TABS: 20.0000 mg | ORAL_TABLET | Freq: Every day | ORAL | 0 refills | Status: DC

## 2023-03-27 MED ORDER — ACETAMINOPHEN 325 MG PO TABS
ORAL_TABLET | ORAL | Status: AC
  Filled 2023-03-27: qty 3

## 2023-03-27 MED ORDER — ACETAMINOPHEN 325 MG PO TABS
975.0000 mg | ORAL_TABLET | Freq: Once | ORAL | Status: AC
  Administered 2023-03-27: 975 mg via ORAL

## 2023-03-27 NOTE — ED Triage Notes (Signed)
C/O starting with right anterior wrist pain onset yesterday without known injury. C/O pain with any ROM of right wrist and inability to apply pressure to hand due to wrist pain. Has tried Tyl and IBU.

## 2023-03-27 NOTE — Discharge Instructions (Addendum)
Take Tylenol up to 1000 mg every 8 hours for pain Keep the splint of for 5 days, then take it off and see how you do. If the pain comes back, then wear it when you are at work only

## 2023-03-27 NOTE — ED Provider Notes (Addendum)
[facility]    CSN: [id number] Arrival date & time: 03/27/23  1633      History   Chief Complaint Chief Complaint  Patient presents with   Wrist Pain   Appt 1630    HPI Linda Wilson is a 52 y.o. female who presents with onset of R anterior wrist pain since yesterday without known injury. Has tried to tylenol and Ibuprofen. Pain is provoked with ROM of wrist. She denies doing any repetitive wrist work this past weekend. 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 She has hx of OA of L knee.  Has not eaten beef, seafood or increased alcohol in the past week. 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   Tear of lateral meniscus of knee 02/20/2022   Pain in joint of left knee 10/21/2021   Pain in joint of right knee 08/13/2021    Past Surgical History:  Procedure Laterality Date   BIOPSY  11/02/2022   Procedure: BIOPSY;  Surgeon: Shellia Cleverly, DO;  [facility];  Service: Gastroenterology;;   CESAREAN SECTION     CHOLECYSTECTOMY N/A 11/17/2022   Procedure: LAPAROSCOPIC CHOLECYSTECTOMY WITH INTRAOPERATIVE CHOLANGIOGRAM;  Surgeon: Darnell Level, MD;  Location: [facility];  Service: General;  Laterality: N/A;   ESOPHAGOGASTRODUODENOSCOPY (EGD) WITH PROPOFOL N/A 11/02/2022   Procedure: ESOPHAGOGASTRODUODENOSCOPY (EGD) WITH PROPOFOL;  Surgeon: Shellia Cleverly, DO;  [facility];  Service: Gastroenterology;  Laterality: N/A;   INCISION AND DRAINAGE ABSCESS  02/15/2007   pubic abscess - I&D Dr Carolynne Edouard   LIVER BIOPSY N/A 11/17/2022   Procedure: LIVER BIOPSY;  Surgeon: Darnell Level, MD;  Location: [facility];  Service: General;  Laterality: N/A;   TUBAL LIGATION  09/16/2005   Lavina Hamman, MD    OB History   No obstetric history on file.      Home Medications    Prior to Admission medications   Medication Sig Start Date End Date Taking? Authorizing Provider  calcipotriene (DOVONOX) 0.005 % cream Apply 1 Application topically 2 (two) times daily for 6 weeks 03/22/23  Yes   clobetasol cream (TEMOVATE) 0.05 % Apply 1 Application topically 2 (two) times daily. 02/13/23  Yes   glucosamine-chondroitin 500-400 MG tablet Take 1 tablet by mouth daily.   Yes [provider]  predniSONE (DELTASONE) 20 MG tablet Take 1 tablet (20 mg total) by mouth daily with breakfast. 03/27/23  Yes Rodriguez-Southworth, Nettie Elm, PA-C  Cyclobenzaprine HCl (FLEXERIL PO) Take 10 mg by mouth daily as needed (pain).    [provider]  loratadine (CLARITIN) 10 MG tablet Take 10 mg by mouth daily.    [provider]  naproxen (NAPROSYN) 500 MG tablet Take 1 tablet (500 mg total) by mouth 2 (two) times daily with a meal. 12/03/22   Daphine Deutscher, Mary-Margaret, FNP  ondansetron (ZOFRAN) 4 MG tablet Take 1 tablet (4 mg total) by mouth  every 8 (eight) hours as needed for nausea or vomiting. Patient taking differently: Take 4 mg by mouth daily as needed for nausea or vomiting. 10/31/22   Junie Spencer, FNP    Family History Family History  Problem Relation Age of Onset   Healthy Mother    Heart disease Father        MI   Diabetes Father    Stroke Father     Social History Social History   Tobacco Use   Smoking status: Never   Smokeless tobacco: Never  Vaping Use   Vaping Use: Never used  Substance Use Topics   Alcohol use: No   Drug use: No     Allergies    Patient has no known allergies.   Review of Systems Review of Systems  Musculoskeletal:  Positive for arthralgias and joint swelling.  Skin:  Negative for color change, pallor, rash and wound.  Neurological:  Negative for numbness.     Physical Exam Triage Vital Signs ED Triage Vitals  Enc Vitals Group     BP 03/27/23 1655 (!) 162/87     Pulse Rate 03/27/23 1655 87     Resp 03/27/23 1655 18     Temp 03/27/23 1655 98.4 F (36.9 C)     Temp Source 03/27/23 1655 Oral     SpO2 03/27/23 1655 97 %     Weight --      Height --      Head Circumference --      Peak Flow --      Pain Score 03/27/23 1657 8     Pain Loc --      Pain Edu? --      Excl. in GC? --    No data found.  Updated Vital Signs BP (!) 162/87   Pulse 87   Temp 98.4 F (36.9 C) (Oral)   Resp 18   LMP 11/09/2014   SpO2 97%   Visual Acuity Right Eye Distance:   Left Eye Distance:   Bilateral Distance:    Right Eye Near:   Left Eye Near:    Bilateral Near:     Physical Exam Vitals and nursing note reviewed.  Constitutional:      General: She is not in acute distress.    Appearance: She is obese. She is not toxic-appearing.  HENT:     Right Ear: External ear normal.     Left Ear: External ear normal.  Eyes:     General: No scleral icterus.    Conjunctiva/sclera: Conjunctivae normal.  Cardiovascular:     Pulses: Normal pulses.  Pulmonary:     Effort: Pulmonary effort is normal.  Musculoskeletal:     Cervical back: Neck supple.     Comments: R WRIST- with no swelling or ecchymosis or warmth noted. Has pain with flexion and extension of wrist.   Neurological:     Mental Status: She is alert and oriented to person, place, and time.     Gait: Gait normal.  Psychiatric:        Mood and Affect: Mood normal.        Behavior: Behavior normal.        Thought Content: Thought content normal.        Judgment: Judgment normal.      UC Treatments / Results  Labs (all labs ordered are listed,  but only abnormal results are displayed) Labs Reviewed - No data to display  EKG   Radiology DG  Wrist Complete Right  Result Date: 03/27/2023 CLINICAL DATA:  Right wrist pain. EXAM: RIGHT WRIST - COMPLETE 3+ VIEW COMPARISON:  None Available. FINDINGS: There is no evidence of fracture or dislocation. There is no evidence of arthropathy or other focal bone abnormality. Soft tissues are unremarkable. IMPRESSION: Negative. Electronically Signed   By: Elgie Collard [name]   On: 03/27/2023 18:02    Procedures Procedures (including critical care time)  Medications Ordered in UC Medications  acetaminophen (TYLENOL) tablet 975 mg (has no administration in time range)    Initial Impression / Assessment and Plan / UC Course  I have reviewed the triage vital signs and the nursing notes. She was given Tylenol as noted.  Pertinent  imaging results that were available during my care of the patient were reviewed by me and considered in my medical decision making (see chart for details).  R wrist pain  She was placed on wrist splint and Prednisone as noted.  Advised to avoid NSAID's due to elevated BP  Final Clinical Impressions(s) / UC Diagnoses   Final diagnoses:  Right wrist pain     Discharge Instructions      Take Tylenol up to 1000 mg every 8 hours for pain Keep the splint of for 5 days, then take it off and see how you do. If the pain comes back, then wear it when you are at work only     ED Prescriptions     Medication Sig Dispense Auth. Provider   predniSONE (DELTASONE) 20 MG tablet Take 1 tablet (20 mg total) by mouth daily with breakfast. 5 tablet Rodriguez-Southworth, Nettie Elm, PA-C      PDMP not reviewed this encounter.   Garey Ham, PA-C 03/27/23 1815    Rodriguez-Southworth, Nettie Elm, PA-C 03/27/23 2127

## 2023-03-28 ENCOUNTER — Other Ambulatory Visit (HOSPITAL_COMMUNITY): Payer: Self-pay

## 2023-03-28 MED ORDER — FLUOROURACIL 5 % EX CREA
1.0000 | TOPICAL_CREAM | Freq: Every day | CUTANEOUS | 0 refills | Status: DC
  Filled 2023-03-28: qty 40, 25d supply, fill #0

## 2023-05-03 ENCOUNTER — Other Ambulatory Visit (HOSPITAL_COMMUNITY): Payer: Self-pay

## 2023-05-03 MED ORDER — CLOBETASOL PROPIONATE 0.05 % EX CREA
1.0000 | TOPICAL_CREAM | Freq: Two times a day (BID) | CUTANEOUS | 1 refills | Status: DC
  Filled 2023-05-03: qty 45, 23d supply, fill #0
  Filled 2023-05-16: qty 45, 30d supply, fill #0

## 2023-05-03 MED ORDER — CALCIPOTRIENE 0.005 % EX CREA
1.0000 | TOPICAL_CREAM | Freq: Two times a day (BID) | CUTANEOUS | 1 refills | Status: AC
  Filled 2023-05-03 – 2023-05-16 (×2): qty 60, 30d supply, fill #0
  Filled 2023-12-08: qty 60, 30d supply, fill #1

## 2023-05-11 ENCOUNTER — Ambulatory Visit (INDEPENDENT_AMBULATORY_CARE_PROVIDER_SITE_OTHER): Payer: Commercial Managed Care - PPO | Admitting: Family Medicine

## 2023-05-11 ENCOUNTER — Encounter: Payer: Self-pay | Admitting: Family Medicine

## 2023-05-11 ENCOUNTER — Telehealth: Payer: Self-pay | Admitting: Family

## 2023-05-11 VITALS — BP 160/82 | HR 95 | Temp 97.6°F | Ht 69.0 in | Wt 282.8 lb

## 2023-05-11 DIAGNOSIS — N3001 Acute cystitis with hematuria: Secondary | ICD-10-CM | POA: Diagnosis not present

## 2023-05-11 DIAGNOSIS — R310 Gross hematuria: Secondary | ICD-10-CM | POA: Diagnosis not present

## 2023-05-11 DIAGNOSIS — N200 Calculus of kidney: Secondary | ICD-10-CM

## 2023-05-11 DIAGNOSIS — R109 Unspecified abdominal pain: Secondary | ICD-10-CM

## 2023-05-11 DIAGNOSIS — Z0279 Encounter for issue of other medical certificate: Secondary | ICD-10-CM

## 2023-05-11 LAB — MICROSCOPIC EXAMINATION
RBC, Urine: >30 /hpf — AB (ref 0–2)
Renal Epithel, UA: NONE SEEN /hpf
WBC, UA: >30 /hpf — AB (ref 0–5)

## 2023-05-11 LAB — URINALYSIS, ROUTINE W REFLEX MICROSCOPIC
Bilirubin, UA: NEGATIVE
Glucose, UA: NEGATIVE
Ketones, UA: NEGATIVE
Nitrite, UA: NEGATIVE
Specific Gravity, UA: 1.015 (ref 1.005–1.030)
Urobilinogen, Ur: 2 mg/dL — ABNORMAL HIGH (ref 0.2–1.0)
pH, UA: 7 (ref 5.0–7.5)

## 2023-05-11 MED ORDER — TAMSULOSIN HCL 0.4 MG PO CAPS: 0.4000 mg | ORAL_CAPSULE | Freq: Every day | ORAL | 3 refills | Status: DC

## 2023-05-11 MED ORDER — SULFAMETHOXAZOLE-TRIMETHOPRIM 800-160 MG PO TABS: 1.0000 | ORAL_TABLET | Freq: Two times a day (BID) | ORAL | 0 refills | Status: AC

## 2023-05-11 MED ORDER — KETOROLAC TROMETHAMINE 60 MG/2ML IM SOLN
60.0000 mg | Freq: Once | INTRAMUSCULAR | Status: AC
Start: 2023-05-11 — End: 2023-05-11
  Administered 2023-05-11: 60 mg via INTRAMUSCULAR

## 2023-05-11 NOTE — Progress Notes (Signed)
Subjective:  Patient ID: Linda Wilson, female    DOB: [birth year], 52 y.o.   MRN: [id number]  Patient Care Team: Junie Spencer, FNP as PCP - General (Family Medicine) Durene Romans, MD as Consulting Physician (Orthopedic Surgery) Meisinger, Tawanna Cooler, MD as Consulting Physician (Obstetrics and Gynecology) Griselda Miner, MD as Consulting Physician (General Surgery)   Chief Complaint:  Flank Pain (Right side and back pain that started at 1am) and Hematuria (Stared today )   HPI: Linda Wilson is a 52 y.o. female presenting on 05/11/2023 for Flank Pain (Right side and back pain that started at 1am) and Hematuria (Stared today )   Flank Pain This is a new problem. Episode onset: 0300 today. The problem has been waxing and waning since onset. Pain location: right flank. Radiates to: right groin. The pain is moderate. Associated symptoms include abdominal pain. Pertinent negatives include no bladder incontinence, bowel incontinence, chest pain, dysuria, fever, headaches, leg pain, numbness, paresis, paresthesias, pelvic pain, perianal numbness, tingling, weakness or weight loss. (Hematuria, nausea) Risk factors: history of renal calculi. She has tried analgesics (fluids) for the symptoms. The treatment provided no relief.      Relevant past medical, surgical, family, and social history reviewed and updated as indicated.  Allergies and medications reviewed and updated. Data reviewed: Chart in Epic.   Past Medical History:  Diagnosis Date   Cancer (HCC)    basal cell   Nephrolithiasis 10/20/2022    Past Surgical History:  Procedure Laterality Date   BIOPSY  11/02/2022   Procedure: BIOPSY;  Surgeon: Shellia Cleverly, DO;  [facility];  Service: Gastroenterology;;   CESAREAN SECTION     CHOLECYSTECTOMY N/A 11/17/2022   Procedure: LAPAROSCOPIC CHOLECYSTECTOMY WITH INTRAOPERATIVE CHOLANGIOGRAM;  Surgeon: Darnell Level, MD;  Location: [facility];  Service: General;   Laterality: N/A;   ESOPHAGOGASTRODUODENOSCOPY (EGD) WITH PROPOFOL N/A 11/02/2022   Procedure: ESOPHAGOGASTRODUODENOSCOPY (EGD) WITH PROPOFOL;  Surgeon: Shellia Cleverly, DO;  [facility];  Service: Gastroenterology;  Laterality: N/A;   INCISION AND DRAINAGE ABSCESS  02/15/2007   pubic abscess - I&D Dr Carolynne Edouard   LIVER BIOPSY N/A 11/17/2022   Procedure: LIVER BIOPSY;  Surgeon: Darnell Level, MD;  Location: [facility];  Service: General;  Laterality: N/A;   TUBAL LIGATION  09/16/2005   Lavina Hamman, MD    Social History   Socioeconomic History   Marital status: Single    Spouse name: Not on file   Number of children: Not on file   Years of education: Not on file   Highest education level: Not on file  Occupational History   Not on file  Tobacco Use   Smoking status: Never   Smokeless tobacco: Never  Vaping Use   Vaping status: Never Used  Substance and Sexual Activity   Alcohol use: No   Drug use: No   Sexual activity: Not Currently  Other Topics Concern   Not on file  Social History Narrative   Not on file   Social Determinants of Health   Financial Resource Strain: Not on file  Food Insecurity: No Food Insecurity (11/01/2022)   Hunger Vital Sign    Worried About Running Out of Food in the Last Year: Never true    Ran Out of Food in the Last Year: Never true  Transportation Needs: No Transportation Needs (11/01/2022)   PRAPARE - Administrator, Civil Service (Medical): No    Lack of Transportation (Non-Medical): No  Physical Activity: Not on file  Stress: Not on file  Social Connections: Not on file  Intimate Partner Violence: Not At Risk (11/01/2022)   Humiliation, Afraid, Rape, and Kick questionnaire    Fear of Current or Ex-Partner: No    Emotionally Abused: No    Physically Abused: No    Sexually Abused: No    Outpatient Encounter Medications as of 05/11/2023  Medication Sig   calcipotriene (DOVONOX) 0.005 % cream Apply 1 Application topically 2  (two) times daily for 30 days.   clobetasol cream (TEMOVATE) 0.05 % Apply 1 Application topically 2 (two) times daily.   clobetasol cream (TEMOVATE) 0.05 % Apply 1 application to the affected area(s) twice daily on the weekends.   Cyclobenzaprine HCl (FLEXERIL PO) Take 10 mg by mouth daily as needed (pain).   fluorouracil (EFUDEX) 5 % cream Apply 1 Application topically daily for 42 days   glucosamine-chondroitin 500-400 MG tablet Take 1 tablet by mouth daily.   loratadine (CLARITIN) 10 MG tablet Take 10 mg by mouth daily.   naproxen (NAPROSYN) 500 MG tablet Take 1 tablet (500 mg total) by mouth 2 (two) times daily with a meal.   ondansetron (ZOFRAN) 4 MG tablet Take 1 tablet (4 mg total) by mouth every 8 (eight) hours as needed for nausea or vomiting. (Patient taking differently: Take 4 mg by mouth daily as needed for nausea or vomiting.)   sulfamethoxazole-trimethoprim (BACTRIM DS) 800-160 MG tablet Take 1 tablet by mouth 2 (two) times daily for 7 days.   tamsulosin (FLOMAX) 0.4 MG CAPS capsule Take 1 capsule (0.4 mg total) by mouth daily.   [DISCONTINUED] predniSONE (DELTASONE) 20 MG tablet Take 1 tablet (20 mg total) by mouth daily with breakfast.   [EXPIRED] ketorolac (TORADOL) injection 60 mg    No facility-administered encounter medications on file as of 05/11/2023.    No Known Allergies  Review of Systems  Constitutional:  Positive for activity change and appetite change. Negative for chills, diaphoresis, fatigue, fever, unexpected weight change and weight loss.  Respiratory:  Negative for cough and shortness of breath.   Cardiovascular:  Negative for chest pain, palpitations and leg swelling.  Gastrointestinal:  Positive for abdominal pain and nausea. Negative for abdominal distention, anal bleeding, blood in stool, bowel incontinence, constipation, diarrhea, rectal pain and vomiting.  Genitourinary:  Positive for flank pain and hematuria. Negative for bladder incontinence, decreased  urine volume, difficulty urinating, dyspareunia, dysuria, enuresis, frequency, genital sores, pelvic pain, urgency, vaginal bleeding, vaginal discharge and vaginal pain.  Neurological:  Negative for dizziness, tingling, tremors, seizures, syncope, speech difficulty, weakness, light-headedness, numbness, headaches and paresthesias.  Psychiatric/Behavioral:  Negative for confusion.   All other systems reviewed and are negative.       Objective:  BP (!) 160/82   Pulse 95   Temp 97.6 F (36.4 C) (Temporal)   Ht 5\' 9"  (1.753 m)   Wt 282 lb 12.8 oz (128.3 kg)   LMP 11/09/2014   SpO2 100%   BMI 41.76 kg/m    Wt Readings from Last 3 Encounters:  05/11/23 282 lb 12.8 oz (128.3 kg)  02/16/23 287 lb 4 oz (130.3 kg)  11/17/22 286 lb (129.7 kg)    Physical Exam Vitals and nursing note reviewed.  Constitutional:      General: She is in acute distress.     Appearance: She is obese. She is not ill-appearing, toxic-appearing or diaphoretic.  HENT:     Head: Normocephalic and atraumatic.  Mouth/Throat:     Mouth: Mucous membranes are moist.  Eyes:     Conjunctiva/sclera: Conjunctivae normal.     Pupils: Pupils are equal, round, and reactive to light.  Cardiovascular:     Rate and Rhythm: Normal rate and regular rhythm.     Heart sounds: Normal heart sounds.  Pulmonary:     Effort: Pulmonary effort is normal.     Breath sounds: Normal breath sounds.  Abdominal:     General: Abdomen is protuberant.     Tenderness: There is no abdominal tenderness. There is guarding (when palpating right abdomen, not tender). There is no right CVA tenderness, left CVA tenderness or rebound. Negative signs include Murphy's sign, Rovsing's sign, McBurney's sign, psoas sign and obturator sign.  Skin:    General: Skin is warm and dry.     Capillary Refill: Capillary refill takes less than 2 seconds.  Neurological:     General: No focal deficit present.     Mental Status: She is alert and oriented to  person, place, and time.  Psychiatric:        Mood and Affect: Mood normal.        Behavior: Behavior normal.        Thought Content: Thought content normal.        Judgment: Judgment normal.     Results for orders placed or performed during the hospital encounter of 11/17/22  Surgical pathology  Result Value Ref Range   SURGICAL PATHOLOGY      SURGICAL PATHOLOGY CASE: [id number] PATIENT: Delle Reining Surgical Pathology Report     Clinical History: Cholecystitis, hepatic steatosis (crm)     FINAL MICROSCOPIC DIAGNOSIS:  A. GALLBLADDER, CHOLECYSTECTOMY: - Chronic cholecystitis  B. 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 See comment - Mildly active steatohepatitis (grade 1 of 3)      COMMENT:  The liver biopsy shows a nodular architecture. Trichrome stain highlights fibrous septa that divide the parenchyma into small nodules and reticulin stain highlights the regenerative nodularity. Septa contain all the expected biliary and vascular structures, which are histologically unremarkable. A ductular reaction, mild in degree, is present at the periphery of the septa. Florid ducts lesions or granulomas are not identified. A mild to moderate mononuclear cell infiltrate composed principally of lymphocytes is present in the septa. Macrovesicular steatosis affects ap proximately 30% hepatocytes with mild ballooning degeneration and few poorly-formed Mallory bodies. Iron stain shows no stainable iron. PASD stain is negative for diastase resistant inclusions in hepatocytes.  The findings are consistent with cirrhosis of uncertain etiology. Presence of concurrent, mildly active steatohepatitis, alcoholic and/or non-alcoholic, is suggestive of steatohepatitis as the most likely cause of cirrhosis.    GROSS DESCRIPTION:  A: Size/?Intact: A disrupted gallbladder measuring 9.6 x 3.6 x 1.5 cm Serosal surface: Pink-purple, smooth, with a  0.1 cm defect in the body of the gallbladder Mucosa/Wall: Mucosa is tan-red, with a 0.5 cm yellow, bosselated polypoid structure identified.  Wall measures up to 0.3 cm in thickness Contents: The gallbladder contains a moderate amount of yellow-red bile, and a minimal amount of red-brown softened hemorrhagic material.  No calculi are identified in the gallbladder or specimen container Cystic duct: 0.6 cm  in diameter Block Summary: 1 block submitted  B: The specimen is received fresh and consists of a 1.6 x 1.2 x 0.9 cm piece of tan-red, nodular and cauterized soft tissue.  Sectioning reveals a tan-white, slightly nodular cut surface.  The specimen is  trisected and entirely submitted in 1 cassette.  Lovey Newcomer 11/17/2022)    Final Diagnosis performed by Holley Bouche, MD.   Electronically signed 11/18/2022 Technical and / or Professional components performed at [facility], [address] [place], [place] [address].  Immunohistochemistry Technical component (if applicable) was performed at [facility]. [address], [place], [place] [address].   IMMUNOHISTOCHEMISTRY DISCLAIMER (if applicable): Some of these immunohistochemical stains may have been developed and the performance characteristics determine by [facility]. Some may not have been cleared or approved by the U.S. Food and Drug Administration. The FD A has determined that such clearance or approval is not necessary. This test is used for clinical purposes. It should not be regarded as investigational or for research. This laboratory is certified under the Clinical Laboratory Improvement Amendments of 1988 (CLIA-88) as qualified to perform high complexity clinical laboratory testing.  The controls stained appropriately.        Pertinent labs & imaging results that were available during my care of the patient were reviewed by me and considered in my medical decision  making.  Assessment & Plan:  Kenyetta was seen today for flank pain and hematuria.  Diagnoses and all orders for this visit:   Urinalysis in office with 2+ leukocytes, 3+ blood.   Flank pain Gross hematuria Renal calculus, right No indications of acute pyelonephritis or obstructed stone. Will start below. Pt aware to increase water intake. Red flags that require emergent evaluation and treatment discussed in detail. Report new, worsening, or persistent symptoms. Toradol in office for pain relief.  -     tamsulosin (FLOMAX) 0.4 MG CAPS capsule; Take 1 capsule (0.4 mg total) by mouth daily. -     sulfamethoxazole-trimethoprim (BACTRIM DS) 800-160 MG tablet; Take 1 tablet by mouth 2 (two) times daily for 7 days. -     Urine Culture  Acute cystitis with hematuria Antibiotics as prescribed. Culture pending, will change if warranted. Red flags discussed in detail.  -     tamsulosin (FLOMAX) 0.4 MG CAPS capsule; Take 1 capsule (0.4 mg total) by mouth daily. -     sulfamethoxazole-trimethoprim (BACTRIM DS) 800-160 MG tablet; Take 1 tablet by mouth 2 (two) times daily for 7 days. -     Urine Culture -     ketorolac (TORADOL) injection 60 mg     Continue all other maintenance medications.  Follow up plan: Return if symptoms worsen or fail to improve.   Continue healthy lifestyle choices, including diet (rich in fruits, vegetables, and lean proteins, and low in salt and simple carbohydrates) and exercise (at least 30 minutes of moderate physical activity daily).  Educational handout given for kidney stones   The above assessment and management plan was discussed with the patient. The patient verbalized understanding of and has agreed to the management plan. Patient is aware to call the clinic if they develop any new symptoms or if symptoms persist or worsen. Patient is aware when to return to the clinic for a follow-up visit. Patient educated on when it is appropriate to go to the emergency  department.   Kari Baars, FNP-C [facility] [phone]

## 2023-05-12 NOTE — Telephone Encounter (Signed)
Treating provider completed and signed FMLA forms. They have been faxed to [employer] at fax number [phone]. Patient has been contacted and informed they are complete. Mailed copy to pt.

## 2023-05-12 NOTE — Telephone Encounter (Signed)
Information completed and forwarded to treating provider

## 2023-05-15 ENCOUNTER — Other Ambulatory Visit (HOSPITAL_COMMUNITY): Payer: Self-pay

## 2023-05-15 ENCOUNTER — Telehealth: Payer: Self-pay | Admitting: Family

## 2023-05-15 NOTE — Telephone Encounter (Signed)
[employer] form has already been filled out. I called pt to let her know.  I have put the second form in the shredder.

## 2023-05-15 NOTE — Telephone Encounter (Signed)
[employer] faxed FMLA forms to be completed  Form Fee Paid? (Y/N)     NO LEFT MESSAGE FOR PT TO CALL BACK WITH PAYMENT      If NO, form is placed on front office manager desk to hold until payment received. If YES, then form will be placed in the RX/HH Nurse Coordinators box for completion.  Form will not be processed until payment is received

## 2023-05-16 ENCOUNTER — Other Ambulatory Visit (HOSPITAL_COMMUNITY): Payer: Self-pay

## 2023-05-16 LAB — URINE CULTURE

## 2023-05-16 NOTE — Progress Notes (Signed)
Patient r/c  

## 2023-06-09 ENCOUNTER — Ambulatory Visit (AMBULATORY_SURGERY_CENTER): Payer: Commercial Managed Care - PPO

## 2023-06-09 ENCOUNTER — Other Ambulatory Visit (HOSPITAL_COMMUNITY): Payer: Self-pay

## 2023-06-09 VITALS — Ht 69.0 in | Wt 284.2 lb

## 2023-06-09 DIAGNOSIS — R591 Generalized enlarged lymph nodes: Secondary | ICD-10-CM

## 2023-06-09 DIAGNOSIS — Z1211 Encounter for screening for malignant neoplasm of colon: Secondary | ICD-10-CM

## 2023-06-09 MED ORDER — NA SULFATE-K SULFATE-MG SULF 17.5-3.13-1.6 GM/177ML PO SOLN
1.0000 | Freq: Once | ORAL | 0 refills | Status: AC
Start: 2023-06-09 — End: 2023-06-16
  Filled 2023-06-09: qty 354, 2d supply, fill #0

## 2023-06-09 NOTE — Progress Notes (Signed)

## 2023-06-15 ENCOUNTER — Other Ambulatory Visit (HOSPITAL_BASED_OUTPATIENT_CLINIC_OR_DEPARTMENT_OTHER): Payer: Self-pay

## 2023-06-15 ENCOUNTER — Other Ambulatory Visit (HOSPITAL_COMMUNITY): Payer: Self-pay

## 2023-06-27 ENCOUNTER — Encounter: Payer: Self-pay | Admitting: Gastroenterology

## 2023-07-02 ENCOUNTER — Encounter: Payer: Self-pay | Admitting: Certified Registered Nurse Anesthetist

## 2023-07-07 ENCOUNTER — Encounter: Payer: Self-pay | Admitting: Gastroenterology

## 2023-07-07 ENCOUNTER — Ambulatory Visit: Payer: Commercial Managed Care - PPO | Admitting: Gastroenterology

## 2023-07-07 VITALS — BP 146/87 | HR 78 | Temp 98.8°F | Resp 16 | Ht 69.0 in | Wt 284.2 lb

## 2023-07-07 DIAGNOSIS — D128 Benign neoplasm of rectum: Secondary | ICD-10-CM | POA: Diagnosis not present

## 2023-07-07 DIAGNOSIS — K641 Second degree hemorrhoids: Secondary | ICD-10-CM | POA: Diagnosis not present

## 2023-07-07 DIAGNOSIS — Z1211 Encounter for screening for malignant neoplasm of colon: Secondary | ICD-10-CM

## 2023-07-07 DIAGNOSIS — D124 Benign neoplasm of descending colon: Secondary | ICD-10-CM

## 2023-07-07 DIAGNOSIS — K5730 Diverticulosis of large intestine without perforation or abscess without bleeding: Secondary | ICD-10-CM

## 2023-07-07 DIAGNOSIS — R590 Localized enlarged lymph nodes: Secondary | ICD-10-CM | POA: Diagnosis not present

## 2023-07-07 DIAGNOSIS — K621 Rectal polyp: Secondary | ICD-10-CM | POA: Diagnosis not present

## 2023-07-07 HISTORY — DX: Morbid (severe) obesity due to excess calories: E66.01

## 2023-07-07 MED ORDER — SODIUM CHLORIDE 0.9 % IV SOLN: 500.0000 mL | INTRAVENOUS | Status: DC

## 2023-07-07 NOTE — Progress Notes (Signed)
Pt's states no medical or surgical changes since previsit or office visit. 

## 2023-07-07 NOTE — Progress Notes (Signed)
Report given to PACU, vss 

## 2023-07-07 NOTE — Patient Instructions (Signed)
Await pathology results.   Resume previous diet. Continue present medications.  Handout on polyps, diverticulosis, and hemorrhoids provided.  YOU HAD AN ENDOSCOPIC PROCEDURE TODAY AT THE [facility]:   Refer to the procedure report that was given to you for any specific questions about what was found during the examination.  If the procedure report does not answer your questions, please call your gastroenterologist to clarify.  If you requested that your care partner not be given the details of your procedure findings, then the procedure report has been included in a sealed envelope for you to review at your convenience later.  YOU SHOULD EXPECT: Some feelings of bloating in the abdomen. Passage of more gas than usual.  Walking can help get rid of the air that was put into your GI tract during the procedure and reduce the bloating. If you had a lower endoscopy (such as a colonoscopy or flexible sigmoidoscopy) you may notice spotting of blood in your stool or on the toilet paper. If you underwent a bowel prep for your procedure, you may not have a normal bowel movement for a few days.  Please Note:  You might notice some irritation and congestion in your nose or some drainage.  This is from the oxygen used during your procedure.  There is no need for concern and it should clear up in a day or so.  SYMPTOMS TO REPORT IMMEDIATELY:  Following lower endoscopy (colonoscopy or flexible sigmoidoscopy):  Excessive amounts of blood in the stool  Significant tenderness or worsening of abdominal pains  Swelling of the abdomen that is new, acute  Fever of 100F or higher   For urgent or emergent issues, a gastroenterologist can be reached at any hour by calling [phone]. Do not use MyChart messaging for urgent concerns.    DIET:  We do recommend a small meal at first, but then you may proceed to your regular diet.  Drink plenty of fluids but you should avoid alcoholic beverages for 24  hours.  ACTIVITY:  You should plan to take it easy for the rest of today and you should NOT DRIVE or use heavy machinery until tomorrow (because of the sedation medicines used during the test).    FOLLOW UP: Our staff will call the number listed on your records the next business day following your procedure.  We will call around 7:15- 8:00 am to check on you and address any questions or concerns that you may have regarding the information given to you following your procedure. If we do not reach you, we will leave a message.     If any biopsies were taken you will be contacted by phone or by letter within the next 1-3 weeks.  Please call us at [phone] if you have not heard about the biopsies in 3 weeks.    SIGNATURES/CONFIDENTIALITY: You and/or your care partner have signed paperwork which will be entered into your electronic medical record.  These signatures attest to the fact that that the information above on your After Visit Summary has been reviewed and is understood.  Full responsibility of the confidentiality of this discharge information lies with you and/or your care-partner.  

## 2023-07-07 NOTE — Progress Notes (Signed)
GASTROENTEROLOGY PROCEDURE H&P NOTE   Primary Care Physician: Junie Spencer, FNP    Reason for Procedure:   Colon cancer screening  Plan:    Colonoscopy  Patient is appropriate for endoscopic procedure(s) in the ambulatory ([facility]) setting.  The nature of the procedure, as well as the risks, benefits, and alternatives were carefully and thoroughly reviewed with the patient. Ample time for discussion and questions allowed. The patient understood, was satisfied, and agreed to proceed.     HPI: Linda Wilson is a 52 y.o. female who presents for colonoscopy for CRC screening.  No previous colonoscopy.  Past Medical History:  Diagnosis Date   Allergy    Cancer (HCC)    basal cell   Nephrolithiasis 10/20/2022    Past Surgical History:  Procedure Laterality Date   BIOPSY  11/02/2022   Procedure: BIOPSY;  Surgeon: Shellia Cleverly, DO;  Location: [facility];  Service: Gastroenterology;;   CESAREAN SECTION     CHOLECYSTECTOMY N/A 11/17/2022   Procedure: LAPAROSCOPIC CHOLECYSTECTOMY WITH INTRAOPERATIVE CHOLANGIOGRAM;  Surgeon: Darnell Level, MD;  Location: [facility];  Service: General;  Laterality: N/A;   ESOPHAGOGASTRODUODENOSCOPY (EGD) WITH PROPOFOL N/A 11/02/2022   Procedure: ESOPHAGOGASTRODUODENOSCOPY (EGD) WITH PROPOFOL;  Surgeon: Shellia Cleverly, DO;  Location: [facility];  Service: Gastroenterology;  Laterality: N/A;   INCISION AND DRAINAGE ABSCESS  02/15/2007   pubic abscess - I&D Dr Carolynne Edouard   LIVER BIOPSY N/A 11/17/2022   Procedure: LIVER BIOPSY;  Surgeon: Darnell Level, MD;  Location: [facility];  Service: General;  Laterality: N/A;   TUBAL LIGATION  09/16/2005   Lavina Hamman, MD    Prior to Admission medications   Medication Sig Start Date End Date Taking? Authorizing Provider  calcipotriene (DOVONOX) 0.005 % cream Apply 1 Application topically 2 (two) times daily. 05/02/23     clobetasol cream (TEMOVATE) 0.05 % Apply 1 Application topically 2 (two) times daily.  02/13/23     clobetasol cream (TEMOVATE) 0.05 % Apply 1 application to the affected area(s) twice daily on the weekends. 05/02/23     fluorouracil (EFUDEX) 5 % cream Apply 1 Application topically daily for 42 days 03/28/23     glucosamine-chondroitin 500-400 MG tablet Take 1 tablet by mouth daily.    [provider]  loratadine (CLARITIN) 10 MG tablet Take 10 mg by mouth daily.    [provider]  ondansetron (ZOFRAN) 4 MG tablet Take 1 tablet (4 mg total) by mouth every 8 (eight) hours as needed for nausea or vomiting. Patient taking differently: Take 4 mg by mouth daily as needed for nausea or vomiting. 10/31/22   Junie Spencer, FNP  tamsulosin (FLOMAX) 0.4 MG CAPS capsule Take 1 capsule (0.4 mg total) by mouth daily. Patient not taking: Reported on 06/09/2023 05/11/23   Sonny Masters, FNP    Current Outpatient Medications  Medication Sig Dispense Refill   calcipotriene (DOVONOX) 0.005 % cream Apply 1 Application topically 2 (two) times daily. 60 g 1   clobetasol cream (TEMOVATE) 0.05 % Apply 1 Application topically 2 (two) times daily. 45 g 1   clobetasol cream (TEMOVATE) 0.05 % Apply 1 application to the affected area(s) twice daily on the weekends. 45 g 1   fluorouracil (EFUDEX) 5 % cream Apply 1 Application topically daily for 42 days 40 g 0   glucosamine-chondroitin 500-400 MG tablet Take 1 tablet by mouth daily.     loratadine (CLARITIN) 10 MG tablet Take 10 mg by mouth daily.  ondansetron (ZOFRAN) 4 MG tablet Take 1 tablet (4 mg total) by mouth every 8 (eight) hours as needed for nausea or vomiting. (Patient taking differently: Take 4 mg by mouth daily as needed for nausea or vomiting.) 60 tablet 0   tamsulosin (FLOMAX) 0.4 MG CAPS capsule Take 1 capsule (0.4 mg total) by mouth daily. (Patient not taking: Reported on 06/09/2023) 30 capsule 3   No current facility-administered medications for this visit.    Allergies as of 07/07/2023   (No Known Allergies)     Family History  Problem Relation Age of Onset   Healthy Mother    Heart disease Father        MI   Diabetes Father    Stroke Father    Colon polyps Neg Hx    Colon cancer Neg Hx    Esophageal cancer Neg Hx    Rectal cancer Neg Hx    Stomach cancer Neg Hx     Social History   Socioeconomic History   Marital status: Single    Spouse name: Not on file   Number of children: Not on file   Years of education: Not on file   Highest education level: Not on file  Occupational History   Not on file  Tobacco Use   Smoking status: Never   Smokeless tobacco: Never  Vaping Use   Vaping status: Never Used  Substance and Sexual Activity   Alcohol use: No   Drug use: No   Sexual activity: Not Currently  Other Topics Concern   Not on file  Social History Narrative   Not on file   Social Determinants of Health   Financial Resource Strain: Not on file  Food Insecurity: No Food Insecurity (11/01/2022)   Hunger Vital Sign    Worried About Running Out of Food in the Last Year: Never true    Ran Out of Food in the Last Year: Never true  Transportation Needs: No Transportation Needs (11/01/2022)   PRAPARE - Administrator, Civil Service (Medical): No    Lack of Transportation (Non-Medical): No  Physical Activity: Not on file  Stress: Not on file  Social Connections: Not on file  Intimate Partner Violence: Not At Risk (11/01/2022)   Humiliation, Afraid, Rape, and Kick questionnaire    Fear of Current or Ex-Partner: No    Emotionally Abused: No    Physically Abused: No    Sexually Abused: No    Physical Exam: Vital signs in last 24 hours: @BP  (!) 160/86   Pulse 84   Temp 98.8 F (37.1 C) (Temporal)   Ht 5\' 9"  (1.753 m)   Wt 284 lb 3.2 oz (128.9 kg)   LMP 11/09/2014   SpO2 98%   BMI 41.97 kg/m  GEN: NAD EYE: Sclerae anicteric ENT: MMM CV: Non-tachycardic Pulm: CTA b/l GI: Soft, NT/ND NEURO:  Alert & Oriented x 3   Doristine Locks, DO [facility]   07/07/2023 12:57 PM

## 2023-07-07 NOTE — Op Note (Signed)
[facility] Patient Name: [facility] Procedure Date: 07/07/2023 1:28 PM MRN: [id number] Endoscopist: Doristine Locks , MD, [id number] Age: 52 Referring MD:  Date of Birth: September 21, 1971 Gender: Female Account #: [id number] Procedure:                Colonoscopy Indications:              Screening for colorectal malignant neoplasm, This                            is the patient's first colonoscopy Medicines:                Monitored Anesthesia Care Procedure:                Pre-Anesthesia Assessment:                           - Prior to the procedure, a History and Physical                            was performed, and patient medications and                            allergies were reviewed. The patient's tolerance of                            previous anesthesia was also reviewed. The risks                            and benefits of the procedure and the sedation                            options and risks were discussed with the patient.                            All questions were answered, and informed consent                            was obtained. Prior Anticoagulants: The patient has                            taken no anticoagulant or antiplatelet agents. ASA                            Grade Assessment: III - A patient with severe                            systemic disease. After reviewing the risks and                            benefits, the patient was deemed in satisfactory                            condition to undergo the procedure.  After obtaining informed consent, the colonoscope                            was passed under direct vision. Throughout the                            procedure, the patient's blood pressure, pulse, and                            oxygen saturations were monitored continuously. The                            Olympus Scope SN [id number] was introduced through the                            anus and  advanced to the the cecum, identified by                            appendiceal orifice and ileocecal valve. The                            colonoscopy was performed without difficulty. The                            patient tolerated the procedure well. The quality                            of the bowel preparation was good. The ileocecal                            valve, appendiceal orifice, and rectum were                            photographed. Scope In: 1:33:36 PM Scope Out: 1:51:31 PM Scope Withdrawal Time: 0 hours 11 minutes 32 seconds  Total Procedure Duration: 0 hours 17 minutes 55 seconds  Findings:                 Hemorrhoids were found on perianal exam.                           A 5 mm polyp was found in the distal descending                            colon. The polyp was sessile. The polyp was removed                            with a cold snare. Resection and retrieval were                            complete. Estimated blood loss was minimal.                           Three sessile polyps were found in the rectum.  The                            polyps were 2 to 3 mm in size. These polyps were                            removed with a cold snare. Resection and retrieval                            were complete. Estimated blood loss was minimal.                           A few small-mouthed diverticula were found in the                            sigmoid colon.                           Non-bleeding external and internal hemorrhoids were                            found during retroflexion. The hemorrhoids were                            small.                           The transverse colon and ascending colon revealed                            significantly excessive looping. Advancing the                            scope required straightening and shortening the                            scope to obtain bowel loop reduction and applying                             abdominal pressure. Complications:            No immediate complications. Estimated Blood Loss:     Estimated blood loss was minimal. Impression:               - Hemorrhoids found on perianal exam.                           - One 5 mm polyp in the distal descending colon,                            removed with a cold snare. Resected and retrieved.                           - Three 2 to 3 mm polyps in the rectum, removed  with a cold snare. Resected and retrieved.                           - Diverticulosis in the sigmoid colon.                           - Non-bleeding external and internal hemorrhoids.                           - There was significant looping of the colon.                            Recommend abdominal binder when doing repeat                            colonoscopy. Recommendation:           - Patient has a contact number available for                            emergencies. The signs and symptoms of potential                            delayed complications were discussed with the                            patient. Return to normal activities tomorrow.                            Written discharge instructions were provided to the                            patient.                           - Resume previous diet.                           - Continue present medications.                           - Await pathology results.                           - Repeat colonoscopy for surveillance based on                            pathology results.                           - Return to GI office PRN. Doristine Locks, MD 07/07/2023 1:56:04 PM

## 2023-07-10 ENCOUNTER — Telehealth: Payer: Self-pay

## 2023-07-10 NOTE — Telephone Encounter (Signed)
  Follow up Call-     07/07/2023   12:51 PM  Call back number  Post procedure Call Back phone  # [phone]  Permission to leave phone message Yes     Patient questions:  Do you have a fever, pain , or abdominal swelling? No. Pain Score  0 *  Have you tolerated food without any problems? Yes.    Have you been able to return to your normal activities? Yes.    Do you have any questions about your discharge instructions: Diet   No. Medications  No. Follow up visit  No.  Do you have questions or concerns about your Care? No.  Actions: * If pain score is 4 or above: No action needed, pain <4.

## 2023-07-12 LAB — SURGICAL PATHOLOGY

## 2023-07-23 ENCOUNTER — Telehealth: Payer: Commercial Managed Care - PPO | Admitting: Family

## 2023-07-23 DIAGNOSIS — M545 Low back pain, unspecified: Secondary | ICD-10-CM | POA: Diagnosis not present

## 2023-07-23 MED ORDER — NAPROXEN 500 MG PO TABS
500.0000 mg | ORAL_TABLET | Freq: Two times a day (BID) | ORAL | 0 refills | Status: DC
Start: 2023-07-23 — End: 2023-11-10
  Filled 2023-07-23: qty 30, 15d supply, fill #0

## 2023-07-23 MED ORDER — BACLOFEN 10 MG PO TABS
10.0000 mg | ORAL_TABLET | Freq: Three times a day (TID) | ORAL | 0 refills | Status: DC
  Filled 2023-07-23: qty 30, 10d supply, fill #0

## 2023-07-23 NOTE — Progress Notes (Signed)
E-Visit for Back Pain   We are sorry that you are not feeling well.  Here is how we plan to help!  Based on what you have shared with me it looks like you mostly have acute back pain.  Acute back pain is defined as musculoskeletal pain that can resolve in 1-3 weeks with conservative treatment.  I have prescribed Naprosyn 500 mg take one by mouth twice a day non-steroid anti-inflammatory (NSAID) as well as Baclofen 10 mg every eight hours as needed which is a muscle relaxer  Some patients experience stomach irritation or in increased heartburn with anti-inflammatory drugs.  Please keep in mind that muscle relaxer's can cause fatigue and should not be taken while at work or driving.  Back pain is very common.  The pain often gets better over time.  The cause of back pain is usually not dangerous.  Most people can learn to manage their back pain on their own.  Home Care Stay active.  Start with short walks on flat ground if you can.  Try to walk farther each day. Do not sit, drive or stand in one place for more than 30 minutes.  Do not stay in bed. Do not avoid exercise or work.  Activity can help your back heal faster. Be careful when you bend or lift an object.  Bend at your knees, keep the object close to you, and do not twist. Sleep on a firm mattress.  Lie on your side, and bend your knees.  If you lie on your back, put a pillow under your knees. Only take medicines as told by your doctor. Put ice on the injured area. Put ice in a plastic bag Place a towel between your skin and the bag Leave the ice on for 15-20 minutes, 3-4 times a day for the first 2-3 days. 210 After that, you can switch between ice and heat packs. Ask your doctor about back exercises or massage. Avoid feeling anxious or stressed.  Find good ways to deal with stress, such as exercise.  Get Help Right Way If: Your pain does not go away with rest or medicine. Your pain does not go away in 1 week. You have new  problems. You do not feel well. The pain spreads into your legs. You cannot control when you poop (bowel movement) or pee (urinate) You feel sick to your stomach (nauseous) or throw up (vomit) You have belly (abdominal) pain. You feel like you may pass out (faint). If you develop a fever.  Make Sure you: Understand these instructions. Will watch your condition Will get help right away if you are not doing well or get worse.  Your e-visit answers were reviewed by a board certified advanced clinical practitioner to complete your personal care plan.  Depending on the condition, your plan could have included both over the counter or prescription medications.  If there is a problem please reply  once you have received a response from your provider.  Your safety is important to [place].  If you have drug allergies check your prescription carefully.    You can use MyChart to ask questions about today's visit, request a non-urgent call back, or ask for a work or school excuse for 24 hours related to this e-Visit. If it has been greater than 24 hours you will need to follow up with your provider, or enter a new e-Visit to address those concerns.  You will get an e-mail in the next two days asking about  your experience.  I hope that your e-visit has been valuable and will speed your recovery. Thank you for using e-visits.   Approximately 5 minutes was spent documenting and reviewing patient's chart.

## 2023-07-24 ENCOUNTER — Other Ambulatory Visit: Payer: Self-pay

## 2023-07-24 ENCOUNTER — Other Ambulatory Visit (HOSPITAL_COMMUNITY): Payer: Self-pay

## 2023-08-02 ENCOUNTER — Encounter (HOSPITAL_COMMUNITY): Payer: Self-pay

## 2023-08-02 ENCOUNTER — Other Ambulatory Visit (HOSPITAL_COMMUNITY): Payer: Self-pay

## 2023-08-02 DIAGNOSIS — L409 Psoriasis, unspecified: Secondary | ICD-10-CM | POA: Diagnosis not present

## 2023-08-02 DIAGNOSIS — Z79899 Other long term (current) drug therapy: Secondary | ICD-10-CM | POA: Diagnosis not present

## 2023-08-02 DIAGNOSIS — D485 Neoplasm of uncertain behavior of skin: Secondary | ICD-10-CM | POA: Diagnosis not present

## 2023-08-02 DIAGNOSIS — C44629 Squamous cell carcinoma of skin of left upper limb, including shoulder: Secondary | ICD-10-CM | POA: Diagnosis not present

## 2023-08-02 MED ORDER — CALCIPOTRIENE 0.005 % EX CREA
1.0000 | TOPICAL_CREAM | Freq: Two times a day (BID) | CUTANEOUS | 1 refills | Status: DC
  Filled 2023-08-02: qty 60, 30d supply, fill #0

## 2023-08-02 MED ORDER — CLOBETASOL PROPIONATE 0.05 % EX CREA
1.0000 | TOPICAL_CREAM | Freq: Two times a day (BID) | CUTANEOUS | 1 refills | Status: DC
  Filled 2023-08-02: qty 45, 30d supply, fill #0

## 2023-08-03 ENCOUNTER — Other Ambulatory Visit (HOSPITAL_COMMUNITY): Payer: Self-pay

## 2023-08-03 LAB — LAB REPORT - SCANNED
EGFR: 110
HM Hepatitis Screen: NEGATIVE

## 2023-08-04 ENCOUNTER — Other Ambulatory Visit (HOSPITAL_COMMUNITY): Payer: Self-pay

## 2023-08-09 DIAGNOSIS — L309 Dermatitis, unspecified: Secondary | ICD-10-CM | POA: Diagnosis not present

## 2023-08-17 ENCOUNTER — Other Ambulatory Visit (HOSPITAL_COMMUNITY): Payer: Self-pay

## 2023-08-22 ENCOUNTER — Telehealth: Payer: Commercial Managed Care - PPO | Admitting: Physician Assistant

## 2023-08-22 DIAGNOSIS — J029 Acute pharyngitis, unspecified: Secondary | ICD-10-CM | POA: Diagnosis not present

## 2023-08-22 NOTE — Progress Notes (Signed)

## 2023-08-22 NOTE — Progress Notes (Signed)
I have spent 5 minutes in review of e-visit questionnaire, review and updating patient chart, medical decision making and response to patient.   Mia Milan Cody Jacklynn Dehaas, PA-C    

## 2023-08-28 ENCOUNTER — Other Ambulatory Visit (HOSPITAL_COMMUNITY): Payer: Self-pay

## 2023-08-28 ENCOUNTER — Other Ambulatory Visit: Payer: Self-pay

## 2023-08-30 ENCOUNTER — Other Ambulatory Visit (HOSPITAL_COMMUNITY): Payer: Self-pay

## 2023-08-30 ENCOUNTER — Telehealth: Payer: Self-pay | Admitting: Pharmacist

## 2023-08-30 ENCOUNTER — Other Ambulatory Visit: Payer: Self-pay

## 2023-08-30 MED ORDER — SKYRIZI 150 MG/ML ~~LOC~~ SOSY: PREFILLED_SYRINGE | SUBCUTANEOUS | 3 refills | Status: DC

## 2023-08-30 NOTE — Telephone Encounter (Signed)
Called patient to schedule an appointment for the [employer] [facility]. I was unable to reach the patient so I left a HIPAA-compliant message requesting that the patient return my call.   Luke Van Ausdall, PharmD, BCACP, CPP Clinical Pharmacist [facility] [phone]  

## 2023-08-30 NOTE — Progress Notes (Signed)
Pharmacy Patient Advocate Encounter   Received notification from Patient Pharmacy that prior authorization for Cristy Folks is required/requested.   Insurance verification completed.   The patient is insured through [employer] .   Per test claim: PA required; PA submitted to above mentioned insurance via CoverMyMeds Key/confirmation #/EOC [id number] Status is pending

## 2023-08-31 ENCOUNTER — Other Ambulatory Visit (HOSPITAL_COMMUNITY): Payer: Self-pay

## 2023-08-31 ENCOUNTER — Other Ambulatory Visit: Payer: Self-pay | Admitting: Pharmacist

## 2023-08-31 ENCOUNTER — Ambulatory Visit: Payer: Commercial Managed Care - PPO | Attending: Family | Admitting: Pharmacist

## 2023-08-31 ENCOUNTER — Other Ambulatory Visit: Payer: Self-pay

## 2023-08-31 DIAGNOSIS — Z7189 Other specified counseling: Secondary | ICD-10-CM

## 2023-08-31 DIAGNOSIS — C44629 Squamous cell carcinoma of skin of left upper limb, including shoulder: Secondary | ICD-10-CM | POA: Diagnosis not present

## 2023-08-31 MED ORDER — SKYRIZI 150 MG/ML ~~LOC~~ SOSY
PREFILLED_SYRINGE | SUBCUTANEOUS | 3 refills | Status: DC
  Filled 2023-08-31: qty 1, 28d supply, fill #0
  Filled 2023-08-31: qty 1, fill #0
  Filled 2023-09-04: qty 1, 28d supply, fill #0
  Filled 2023-11-14: qty 1, 84d supply, fill #1
  Filled 2024-01-25: qty 1, 84d supply, fill #2
  Filled 2024-04-23: qty 1, 30d supply, fill #3
  Filled 2024-04-24: qty 1, 84d supply, fill #3

## 2023-08-31 NOTE — Progress Notes (Signed)
Pharmacy Patient Advocate Encounter  Received notification from [facility] that Prior Authorization for Cristy Folks has been APPROVED from 08/31/23 to 09/30/23  Additional authorization has been APPROVED for 1 syringe per 84 days from 09/21/23 to 02/18/24  PA #/Case ID/Reference #: [id number]

## 2023-08-31 NOTE — Progress Notes (Signed)
Specialty Pharmacy Initial Fill Coordination Note  Linda Wilson is a 52 y.o. female contacted today regarding initial fill of specialty medication(s) Risankizumab-Rzaa (Antipsoriatics)   Patient requested Delivery   Delivery date: 09/05/23   Verified address: [address]   Medication will be filled on 12/9.   Patient is aware of $0 copayment.

## 2023-08-31 NOTE — Progress Notes (Signed)
Please see OV from 08/31/2023 for complete documentation.   Butch Penny, PharmD, Patsy [place], CPP Clinical Pharmacist [facility] [phone]

## 2023-08-31 NOTE — Progress Notes (Signed)
   S: Patient presents for review of their specialty medication therapy.  Patient is currently taking Skyrizi for psoriasis. Patient is managed by Dr. Elpidio Anis for this.   Adherence: confirms. Completed her LD 08/16/2023.   Efficacy: just started therapy.   Dosing: Plaque psoriasis, moderate to severe: SubQ: Two consecutive injections (75 mg each) for a total dose of 150 mg at weeks 0, 4, and then every 12 weeks thereafter.  Screening: TB test: completed   Monitoring: S/sx of infection: none  S/sx of hypersensitivity/injection site reaction: none   O:     Lab Results  Component Value Date   WBC 7.8 11/15/2022   HGB 10.2 (L) 11/15/2022   HCT 35.9 (L) 11/15/2022   MCV 73.0 (L) 11/15/2022   PLT 364 11/15/2022      Chemistry      Component Value Date/Time   NA 139 11/02/2022 0348   NA 142 10/31/2022 1533   K 3.7 11/02/2022 0348   CL 103 11/02/2022 0348   CO2 28 11/02/2022 0348   BUN <5 (L) 11/02/2022 0348   BUN 8 10/31/2022 1533   CREATININE 0.72 11/02/2022 0348      Component Value Date/Time   CALCIUM 8.3 (L) 11/02/2022 0348   ALKPHOS 58 11/02/2022 0348   AST 21 11/02/2022 0348   ALT 12 11/02/2022 0348   BILITOT 0.9 11/02/2022 0348       A/P: 1. Medication review: Patient currently on Skyrizi for psoriasis. Reviewed the medication with the patient, including the following: Cristy Folks is a monoclonal antibody used in the treatment of psoriasis. Patient educated on purpose, proper use and potential adverse effects of Skyrizi. Possible adverse effects are infections, headache, and injection site reactions. Live vaccinations should be avoided while on therapy. SubQ: Administer the two consecutive injections subcutaneously at different anatomic locations, such as thighs, abdomen, or back of upper arms. Do not inject into areas where the skin is tender, bruised, red, hard, or affected by psoriasis. Intended for use under supervision of a health care professional;  self-injection may occur after proper training (except back of upper arms). No recommendations for any changes at this time.   Butch Penny, PharmD, Patsy [place], CPP Clinical Pharmacist [facility] [phone]

## 2023-09-01 ENCOUNTER — Other Ambulatory Visit: Payer: Self-pay

## 2023-09-04 ENCOUNTER — Other Ambulatory Visit: Payer: Self-pay

## 2023-09-04 ENCOUNTER — Other Ambulatory Visit (HOSPITAL_COMMUNITY): Payer: Self-pay

## 2023-09-14 DIAGNOSIS — Z4802 Encounter for removal of sutures: Secondary | ICD-10-CM | POA: Diagnosis not present

## 2023-09-14 DIAGNOSIS — Z79899 Other long term (current) drug therapy: Secondary | ICD-10-CM | POA: Diagnosis not present

## 2023-09-15 LAB — LAB REPORT - SCANNED: EGFR: 112

## 2023-10-02 ENCOUNTER — Encounter (HOSPITAL_COMMUNITY): Payer: Self-pay

## 2023-10-02 ENCOUNTER — Ambulatory Visit (HOSPITAL_COMMUNITY)
Admission: RE | Admit: 2023-10-02 | Discharge: 2023-10-02 | Disposition: A | Discharge status: Home / Self Care | Payer: Commercial Managed Care - PPO | Source: Ambulatory Visit | Attending: Emergency Medicine | Admitting: Emergency Medicine

## 2023-10-02 ENCOUNTER — Ambulatory Visit (INDEPENDENT_AMBULATORY_CARE_PROVIDER_SITE_OTHER): Payer: Commercial Managed Care - PPO

## 2023-10-02 VITALS — BP 175/80 | HR 96 | Temp 98.3°F | Resp 18

## 2023-10-02 DIAGNOSIS — K5900 Constipation, unspecified: Secondary | ICD-10-CM

## 2023-10-02 DIAGNOSIS — R109 Unspecified abdominal pain: Secondary | ICD-10-CM

## 2023-10-02 LAB — POCT URINALYSIS DIP (MANUAL ENTRY)
Bilirubin, UA: NEGATIVE
Blood, UA: NEGATIVE
Glucose, UA: NEGATIVE mg/dL
Ketones, POC UA: NEGATIVE mg/dL
Leukocytes, UA: NEGATIVE
Nitrite, UA: NEGATIVE
Protein Ur, POC: NEGATIVE mg/dL
Spec Grav, UA: 1.01
Urobilinogen, UA: 0.2 U/dL
pH, UA: 7

## 2023-10-02 NOTE — ED Triage Notes (Signed)
 Pt c/o rt side pain on movement, coughing, or sneezing since Friday. States pain became worse last night. Took tylenol this am with no relief.

## 2023-10-02 NOTE — ED Provider Notes (Signed)
 [facility]    CSN: [id number] Arrival date & time: 10/02/23  1624    HISTORY   Chief Complaint  Patient presents with   Abdominal Pain    Having pain in right side of abdomen that hurts when take a deep breath, move or cough. - Entered by patient   HPI Linda Wilson is a pleasant, 53 y.o. female who presents to urgent care today. Patient complains of a 3-day history of gradually worsening right sided abdominal pain, both upper and lower.  Patient states when she twists, lies on her right side, coughs or sneezes the pain intensifies.  Patient states last night, she was unable to sleep secondary to the pain.  Patient states she took Tylenol  this morning without relief.  Patient states she has been moving her bowels frequently throughout the day.  EMR reviewed, patient does have a history of constipation.  Patient has also had her gallbladder removed.  Patient denies diarrhea, dark and tarry stool, nausea, acid reflux.  The history is provided by the patient.   Past Medical History:  Diagnosis Date   Allergy    Cancer (HCC)    basal cell   Morbid (severe) obesity due to excess calories (HCC) 07/07/2023   bmi 41.97   Nephrolithiasis 10/20/2022   Patient Active Problem List   Diagnosis Date Noted   Cholecystitis without cholelithiasis 11/13/2022   Gallbladder sludge 11/13/2022   Intractable abdominal pain 11/01/2022   Microcytic anemia 11/01/2022   Abnormal finding on GI tract imaging 11/01/2022   Upper abdominal pain 11/01/2022   RUQ pain 10/21/2022   Nephrolithiasis 10/20/2022   Obesity, Class III, BMI 40-49.9 (morbid obesity) (HCC) 10/20/2022   Hypokalemia 10/20/2022   Normocytic anemia 10/20/2022   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   Tear of lateral meniscus of knee 02/20/2022   Pain in joint of left knee 10/21/2021   Pain in joint of right knee 08/13/2021   Past Surgical History:  Procedure Laterality Date   BIOPSY  11/02/2022   Procedure:  BIOPSY;  Surgeon: San Sandor GAILS, DO;  Location: [facility];  Service: Gastroenterology;;   CESAREAN SECTION     CHOLECYSTECTOMY N/A 11/17/2022   Procedure: LAPAROSCOPIC CHOLECYSTECTOMY WITH INTRAOPERATIVE CHOLANGIOGRAM;  Surgeon: Eletha Boas, MD;  Location: [facility];  Service: General;  Laterality: N/A;   ESOPHAGOGASTRODUODENOSCOPY (EGD) WITH PROPOFOL  N/A 11/02/2022   Procedure: ESOPHAGOGASTRODUODENOSCOPY (EGD) WITH PROPOFOL ;  Surgeon: San Sandor GAILS, DO;  Location: [facility];  Service: Gastroenterology;  Laterality: N/A;   INCISION AND DRAINAGE ABSCESS  02/15/2007   pubic abscess - I&D Dr Curvin   LIVER BIOPSY N/A 11/17/2022   Procedure: LIVER BIOPSY;  Surgeon: Eletha Boas, MD;  Location: [facility];  Service: General;  Laterality: N/A;   TUBAL LIGATION  09/16/2005   Boas Deaner, MD   OB History   No obstetric history on file.    Home Medications    Prior to Admission medications   Medication Sig Start Date End Date Taking? Authorizing Provider  baclofen  (LIORESAL ) 10 MG tablet Take 1 tablet (10 mg total) by mouth 3 (three) times daily. 07/23/23   Lavell Lye A, FNP  calcipotriene  (DOVONOX) 0.005 % cream Apply 1 Application topically 2 (two) times daily. 05/02/23     calcipotriene  (DOVONOX) 0.005 % cream Apply 1 application topically 2 (two) times daily. 08/02/23     clobetasol  cream (TEMOVATE ) 0.05 % Apply 1 Application topically 2 (two) times daily. 02/13/23     clobetasol  cream (TEMOVATE ) 0.05 %  Apply 1 application to the affected area(s) twice daily on the weekends. 05/02/23     clobetasol  cream (TEMOVATE ) 0.05 % Apply 1 Application topically 2 (two) times daily on the weekends 08/02/23     glucosamine-chondroitin 500-400 MG tablet Take 1 tablet by mouth daily.    [provider]  loratadine (CLARITIN) 10 MG tablet Take 10 mg by mouth daily.    [provider]  naproxen  (NAPROSYN ) 500 MG tablet Take 1 tablet (500 mg total) by mouth 2 (two) times daily with a  meal. 07/23/23   Hawks, Bari A, FNP  ondansetron  (ZOFRAN ) 4 MG tablet Take 1 tablet (4 mg total) by mouth every 8 (eight) hours as needed for nausea or vomiting. Patient taking differently: Take 4 mg by mouth daily as needed for nausea or vomiting. 10/31/22   Lavell Bari LABOR, FNP  risankizumab -rzaa (SKYRIZI ) 150 MG/ML SOSY prefilled syringe Inject 1 syringe subcutaneously every three months 08/31/23   Jegede, Olugbemiga E, MD    Family History Family History  Problem Relation Age of Onset   Healthy Mother    Heart disease Father        MI   Diabetes Father    Stroke Father    Colon polyps Neg Hx    Colon cancer Neg Hx    Esophageal cancer Neg Hx    Rectal cancer Neg Hx    Stomach cancer Neg Hx    Social History Social History   Tobacco Use   Smoking status: Never   Smokeless tobacco: Never  Vaping Use   Vaping status: Never Used  Substance Use Topics   Alcohol use: No   Drug use: No   Allergies   Patient has no known allergies.  Review of Systems Review of Systems Pertinent findings revealed after performing a 14 point review of systems has been noted in the history of present illness.  Physical Exam Vital Signs BP (!) 175/80 (BP Location: Right Arm)   Pulse 96   Temp 98.3 F (36.8 C) (Oral)   Resp 18   LMP 11/09/2014   SpO2 97%   No data found.  Physical Exam Vitals and nursing note reviewed.  Constitutional:      General: She is not in acute distress.    Appearance: Normal appearance. She is obese.  HENT:     Head: Normocephalic and atraumatic.  Eyes:     Pupils: Pupils are equal, round, and reactive to light.  Cardiovascular:     Rate and Rhythm: Normal rate and regular rhythm.  Pulmonary:     Effort: Pulmonary effort is normal.     Breath sounds: Normal breath sounds.  Abdominal:     General: Bowel sounds are decreased.     Tenderness: There is abdominal tenderness in the right upper quadrant and right lower quadrant.  Musculoskeletal:         General: Normal range of motion.     Cervical back: Normal range of motion and neck supple.  Skin:    General: Skin is warm and dry.  Neurological:     General: No focal deficit present.     Mental Status: She is alert and oriented to person, place, and time. Mental status is at baseline.  Psychiatric:        Mood and Affect: Mood normal.        Behavior: Behavior normal.        Thought Content: Thought content normal.        Judgment:  Judgment normal.     Visual Acuity Right Eye Distance:   Left Eye Distance:   Bilateral Distance:    Right Eye Near:   Left Eye Near:    Bilateral Near:     UC Couse / Diagnostics / Procedures:     Radiology No results found.  Procedures Procedures (including critical care time) EKG  Pending results:  Labs Reviewed  POCT URINALYSIS DIP (MANUAL ENTRY) - Normal    Medications Ordered in UC: Medications - No data to display  UC Diagnoses / Final Clinical Impressions(s)   I have reviewed the triage vital signs and the nursing notes.  Pertinent labs & imaging results that were available during my care of the patient were reviewed by me and considered in my medical decision making (see chart for details).    Final diagnoses:  Right sided abdominal pain  Constipation, unspecified constipation type   Results of abdominal imaging discussed with patient, concerning for constipation.  Patient advised to clear her bowels either using Gatorade and MiraLAX or magnesium citrate.  Instructions provided for both.  Patient advised to follow-up with the emergency room if clearing bowels does not relieve symptoms or if symptoms worsen.  Please see discharge instructions below for details of plan of care as provided to patient. ED Prescriptions   None    PDMP not reviewed this encounter.  Pending results:  Labs Reviewed  POCT URINALYSIS DIP (MANUAL ENTRY) - Normal      Discharge Instructions      The imaging of your abdomen reveals a  significant amount of stool with entrapped gas.  I believe this is the source of your pain at this time.  I believe that you are suffering from constipation.  I recommend that you clear your bowels using one of the following methods.    Method one:  Please purchase 32 ounces of your favorite flavor of Gatorade and a 250 g bottle of MiraLAX, also known as polyethylene glycol.  It is not important to buy the brand name, the generic version will do.  Pour the MiraLAX powder into the Gatorade and mix well.  When the MiraLAX powder is completely dissolved, please consume the entire 32 ounces within 1 hour.  Within the next 4 hours 4 hours, perhaps a little more or a little less, you should feel a significant urge to move your bowels and be able to produce a significant amount of stool.  If you feel that you have not had significant production of stool, please repeat this process and wait another 4 hours.    Method two:  Please purchase 1 bottle of magnesium citrate at your pharmacy.  Drink one half of the bottle.  Within the next 4 hours, perhaps a little more or little less, you should feel significant urge to move your bowels and be able to produce a significant amount of stool.  If you feel that you have not had significant production of stool, please drink the second half of the bottle and wait another 4 hours.      Regardless of the bowel cleanout method you chose, if you are unable to produce a significant amount of stool, if clearing your bowels does not completely resolve your abdominal pain or if your abdominal pain becomes significantly worse, please go to the emergency room for further evaluation.  3D imaging, such as a CT scan or an ultrasound, may be indicated.  Thank you for visiting [facility] today.  We  appreciate the opportunity to participate in your care.       Disposition Upon Discharge:  Condition: stable for discharge home  Patient presented with an acute  illness with associated systemic symptoms and significant discomfort requiring urgent management. In my opinion, this is a condition that a prudent lay person (someone who possesses an average knowledge of health and medicine) may potentially expect to result in complications if not addressed urgently such as respiratory distress, impairment of bodily function or dysfunction of bodily organs.   Routine symptom specific, illness specific and/or disease specific instructions were discussed with the patient and/or caregiver at length.   As such, the patient has been evaluated and assessed, work-up was performed and treatment was provided in alignment with urgent care protocols and evidence based medicine.  Patient/parent/caregiver has been advised that the patient may require follow up for further testing and treatment if the symptoms continue in spite of treatment, as clinically indicated and appropriate.  Patient/parent/caregiver has been advised to return to the [facility] or PCP if no better; to PCP or the Emergency Department if new signs and symptoms develop, or if the current signs or symptoms continue to change or worsen for further workup, evaluation and treatment as clinically indicated and appropriate  The patient will follow up with their current PCP if and as advised. If the patient does not currently have a PCP we will assist them in obtaining one.   The patient may need specialty follow up if the symptoms continue, in spite of conservative treatment and management, for further workup, evaluation, consultation and treatment as clinically indicated and appropriate.  Patient/parent/caregiver verbalized understanding and agreement of plan as discussed.  All questions were addressed during visit.  Please see discharge instructions below for further details of plan.  This office note has been dictated using Teaching laboratory technician.  Unfortunately, this method of dictation can sometimes lead  to typographical or grammatical errors.  I apologize for your inconvenience in advance if this occurs.  Please do not hesitate to reach out to me if clarification is needed.      Joesph Shaver Scales, PA-C 10/02/23 1800

## 2023-10-02 NOTE — Discharge Instructions (Signed)
 The imaging of your abdomen reveals a significant amount of stool with entrapped gas.  I believe this is the source of your pain at this time.  I believe that you are suffering from constipation.  I recommend that you clear your bowels using one of the following methods.    Method one:  Please purchase 32 ounces of your favorite flavor of Gatorade and a 250 g bottle of MiraLAX, also known as polyethylene glycol.  It is not important to buy the brand name, the generic version will do.  Pour the MiraLAX powder into the Gatorade and mix well.  When the MiraLAX powder is completely dissolved, please consume the entire 32 ounces within 1 hour.  Within the next 4 hours 4 hours, perhaps a little more or a little less, you should feel a significant urge to move your bowels and be able to produce a significant amount of stool.  If you feel that you have not had significant production of stool, please repeat this process and wait another 4 hours.    Method two:  Please purchase 1 bottle of magnesium citrate at your pharmacy.  Drink one half of the bottle.  Within the next 4 hours, perhaps a little more or little less, you should feel significant urge to move your bowels and be able to produce a significant amount of stool.  If you feel that you have not had significant production of stool, please drink the second half of the bottle and wait another 4 hours.      Regardless of the bowel cleanout method you chose, if you are unable to produce a significant amount of stool, if clearing your bowels does not completely resolve your abdominal pain or if your abdominal pain becomes significantly worse, please go to the emergency room for further evaluation.  3D imaging, such as a CT scan or an ultrasound, may be indicated.  Thank you for visiting [facility] today.  We appreciate the opportunity to participate in your care.

## 2023-10-05 ENCOUNTER — Other Ambulatory Visit (HOSPITAL_COMMUNITY): Payer: Self-pay

## 2023-11-01 ENCOUNTER — Telehealth: Payer: Commercial Managed Care - PPO | Admitting: Physician Assistant

## 2023-11-01 DIAGNOSIS — R6889 Other general symptoms and signs: Secondary | ICD-10-CM

## 2023-11-01 MED ORDER — OSELTAMIVIR PHOSPHATE 75 MG PO CAPS: 75.0000 mg | ORAL_CAPSULE | Freq: Two times a day (BID) | ORAL | 0 refills | Status: DC

## 2023-11-01 NOTE — Progress Notes (Signed)
 I have spent 5 minutes in review of e-visit questionnaire, review and updating patient chart, medical decision making and response to patient.   Piedad Climes, PA-C

## 2023-11-01 NOTE — Progress Notes (Signed)

## 2023-11-10 ENCOUNTER — Telehealth: Payer: Commercial Managed Care - PPO | Admitting: Family

## 2023-11-10 ENCOUNTER — Other Ambulatory Visit: Payer: Commercial Managed Care - PPO

## 2023-11-10 ENCOUNTER — Encounter: Payer: Self-pay | Admitting: Family

## 2023-11-10 DIAGNOSIS — D649 Anemia, unspecified: Secondary | ICD-10-CM

## 2023-11-10 DIAGNOSIS — L409 Psoriasis, unspecified: Secondary | ICD-10-CM

## 2023-11-10 DIAGNOSIS — L309 Dermatitis, unspecified: Secondary | ICD-10-CM | POA: Diagnosis not present

## 2023-11-10 NOTE — Patient Instructions (Signed)

## 2023-11-10 NOTE — Progress Notes (Signed)
Virtual Visit Consent   Linda Wilson, you are scheduled for a virtual visit with a [facility] provider today. Just as with appointments in the office, your consent must be obtained to participate. Your consent will be active for this visit and any virtual visit you may have with one of our providers in the next 365 days. If you have a MyChart account, a copy of this consent can be sent to you electronically.  As this is a virtual visit, video technology does not allow for your provider to perform a traditional examination. This may limit your provider's ability to fully assess your condition. If your provider identifies any concerns that need to be evaluated in person or the need to arrange testing (such as labs, EKG, etc.), we will make arrangements to do so. Although advances in technology are sophisticated, we cannot ensure that it will always work on either your end or our end. If the connection with a video visit is poor, the visit may have to be switched to a telephone visit. With either a video or telephone visit, we are not always able to ensure that we have a secure connection.  By engaging in this virtual visit, you consent to the provision of healthcare and authorize for your insurance to be billed (if applicable) for the services provided during this visit. Depending on your insurance coverage, you may receive a charge related to this service.  I need to obtain your verbal consent now. Are you willing to proceed with your visit today? Linda Wilson has provided verbal consent on 11/10/2023 for a virtual visit (video or telephone). Jannifer Rodney, FNP  Date: 11/10/2023 3:07 PM   Virtual Visit via Video Note   I, Jannifer Rodney, connected with  Linda Wilson  ([id number], [birth year]) on 11/10/23 at  3:10 PM EST by a video-enabled telemedicine application and verified that I am speaking with the correct person using two identifiers.  Location: Patient: Virtual Visit Location  Patient: Home Provider: Virtual Visit Location Provider: Home Office   I discussed the limitations of evaluation and management by telemedicine and the availability of in person appointments. The patient expressed understanding and agreed to proceed.    History of Present Illness: Linda Wilson is a 53 y.o. who identifies as a female who was assigned female at birth, and is being seen today for anemia. She was seen by her dermatologists who did lab work and was told she was anemic. She is currently taking Skyrizi for eczema and psoriasis.   HPI: Anemia Presents for follow-up visit. Symptoms include light-headedness and malaise/fatigue. There has been no bruising/bleeding easily, fever or leg swelling.    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   Tear of lateral meniscus of knee 02/20/2022   Pain in joint of left knee 10/21/2021   Pain in joint of right knee 08/13/2021    Allergies: No Known Allergies Medications:  Current Outpatient Medications:    calcipotriene (DOVONOX) 0.005 % cream, Apply 1 Application topically 2 (two) times daily., Disp: 60  g, Rfl: 1   clobetasol cream (TEMOVATE) 0.05 %, Apply 1 Application topically 2 (two) times daily., Disp: 45 g, Rfl: 1   glucosamine-chondroitin 500-400 MG tablet, Take 1 tablet by mouth daily., Disp: , Rfl:    loratadine (CLARITIN) 10 MG tablet, Take 10 mg by mouth daily., Disp: , Rfl:    ondansetron (ZOFRAN) 4 MG tablet, Take 1 tablet (4 mg total) by mouth every 8  (eight) hours as needed for nausea or vomiting. (Patient taking differently: Take 4 mg by mouth daily as needed for nausea or vomiting.), Disp: 60 tablet, Rfl: 0   risankizumab-rzaa (SKYRIZI) 150 MG/ML SOSY prefilled syringe, Inject 1 syringe subcutaneously every three months, Disp: 1 mL, Rfl: 3  Observations/Objective: Patient is well-developed, well-nourished in no acute distress.  Resting comfortably  at home.  Head is normocephalic, atraumatic.  No labored breathing.  Speech is clear and coherent with logical content.  Patient is alert and oriented at baseline.    Assessment and Plan: 1. Anemia, unspecified type (Primary) - Anemia Profile B; Future  2. Psoriasis - Anemia Profile B; Future  3. Eczema, unspecified type - Anemia Profile B; Future  Labs pending  Iron rich diet  Keep dermatologists follow up Keep chronic follow up  Follow Up Instructions: I discussed the assessment and treatment plan with the patient. The patient was provided an opportunity to ask questions and all were answered. The patient agreed with the plan and demonstrated an understanding of the instructions.  A copy of instructions were sent to the patient via MyChart unless otherwise noted below.     The patient was advised to call back or seek an in-person evaluation if the symptoms worsen or if the condition fails to improve as anticipated.    Jannifer Rodney, FNP

## 2023-11-11 LAB — ANEMIA PROFILE B
Basophils Absolute: 0 10*3/uL (ref 0.0–0.2)
Basos: 1 %
EOS (ABSOLUTE): 0.2 10*3/uL (ref 0.0–0.4)
Eos: 3 %
Ferritin: 15 ng/mL (ref 15–150)
Folate: 7.1 ng/mL (ref >3.0)
Hematocrit: 36.5 % (ref 34.0–46.6)
Hemoglobin: 10.7 g/dL — ABNORMAL LOW (ref 11.1–15.9)
Immature Grans (Abs): 0 10*3/uL (ref 0.0–0.1)
Immature Granulocytes: 1 %
Iron Saturation: 6 % — CL (ref 15–55)
Iron: 26 ug/dL — ABNORMAL LOW (ref 27–159)
Lymphocytes Absolute: 2.9 10*3/uL (ref 0.7–3.1)
Lymphs: 34 %
MCH: 20 pg — ABNORMAL LOW (ref 26.6–33.0)
MCHC: 29.3 g/dL — ABNORMAL LOW (ref 31.5–35.7)
MCV: 68 fL — ABNORMAL LOW (ref 79–97)
Monocytes Absolute: 0.8 10*3/uL (ref 0.1–0.9)
Monocytes: 9 %
Neutrophils Absolute: 4.4 10*3/uL (ref 1.4–7.0)
Neutrophils: 52 %
Platelets: 304 10*3/uL (ref 150–450)
RBC: 5.35 x10E6/uL — ABNORMAL HIGH (ref 3.77–5.28)
RDW: 18.5 % — ABNORMAL HIGH (ref 11.7–15.4)
Retic Ct Pct: 1.7 % (ref 0.6–2.6)
Total Iron Binding Capacity: 450 ug/dL (ref 250–450)
UIBC: 424 ug/dL (ref 131–425)
Vitamin B-12: 1515 pg/mL — ABNORMAL HIGH (ref 232–1245)
WBC: 8.4 10*3/uL (ref 3.4–10.8)

## 2023-11-13 ENCOUNTER — Telehealth: Payer: Self-pay

## 2023-11-13 NOTE — Telephone Encounter (Signed)
 Copied from CRM [id number]. Topic: Clinical - Lab/Test Results >> Nov 13, 2023  4:10 PM Phill Myron wrote: Pt. Linda Wilson called regarding her labs. Read Verbatim   Junie Spencer, FNP 11/13/2023  3:37 PM EST   Iron very low- Start 325 mg daily with stool softener. Follow up with me in 2 months to recheck.

## 2023-11-14 ENCOUNTER — Other Ambulatory Visit: Payer: Self-pay

## 2023-11-14 NOTE — Progress Notes (Signed)
 Specialty Pharmacy Refill Coordination Note  Rilyn L Larue is a 53 y.o. female contacted today regarding refills of specialty medication(s) Merlyn Albert Cristy Folks)   Patient requested Delivery   Delivery date: 11/23/23   Verified address: [address]   Medication will be filled on 11/22/23. Pending PA.  Injection is due on March 6.

## 2023-11-15 ENCOUNTER — Other Ambulatory Visit: Payer: Self-pay

## 2023-11-15 NOTE — Progress Notes (Signed)
 Received message from specialty pharmacy that PA is required for [facility]. Active PA on file for 1ml per 84 days. Expires on 02/18/24.

## 2023-11-22 ENCOUNTER — Other Ambulatory Visit: Payer: Self-pay

## 2023-11-23 ENCOUNTER — Telehealth: Payer: Commercial Managed Care - PPO | Admitting: Physician Assistant

## 2023-11-23 ENCOUNTER — Other Ambulatory Visit (HOSPITAL_COMMUNITY): Payer: Self-pay

## 2023-11-23 DIAGNOSIS — J208 Acute bronchitis due to other specified organisms: Secondary | ICD-10-CM

## 2023-11-23 MED ORDER — PREDNISONE 20 MG PO TABS
40.0000 mg | ORAL_TABLET | Freq: Every day | ORAL | 0 refills | Status: DC
  Filled 2023-11-23: qty 10, 5d supply, fill #0

## 2023-11-23 MED ORDER — BENZONATATE 100 MG PO CAPS
100.0000 mg | ORAL_CAPSULE | Freq: Three times a day (TID) | ORAL | 0 refills | Status: DC | PRN
  Filled 2023-11-23: qty 30, 10d supply, fill #0

## 2023-11-23 NOTE — Progress Notes (Signed)
 E-Visit for Cough   We are sorry that you are not feeling well.  Here is how we plan to help!  Based on your presentation I believe you most likely have A cough due to a virus.  This is called viral bronchitis and is best treated by rest, plenty of fluids and control of the cough.  You may use Ibuprofen or Tylenol as directed to help your symptoms.     In addition I have prescribed a 5-day burst of prednisone and a cough medication to use as directed.  From your responses in the eVisit questionnaire you describe inflammation in the upper respiratory tract which is causing a significant cough.  This is commonly called Bronchitis and has four common causes:   Allergies Viral Infections Acid Reflux Bacterial Infection Allergies, viruses and acid reflux are treated by controlling symptoms or eliminating the cause. An example might be a cough caused by taking certain blood pressure medications. You stop the cough by changing the medication. Another example might be a cough caused by acid reflux. Controlling the reflux helps control the cough.  USE OF BRONCHODILATOR ("RESCUE") INHALERS: There is a risk from using your bronchodilator too frequently.  The risk is that over-reliance on a medication which only relaxes the muscles surrounding the breathing tubes can reduce the effectiveness of medications prescribed to reduce swelling and congestion of the tubes themselves.  Although you feel brief relief from the bronchodilator inhaler, your asthma may actually be worsening with the tubes becoming more swollen and filled with mucus.  This can delay other crucial treatments, such as oral steroid medications. If you need to use a bronchodilator inhaler daily, several times per day, you should discuss this with your provider.  There are probably better treatments that could be used to keep your asthma under control.     HOME CARE Only take medications as instructed by your medical team. Complete the entire  course of an antibiotic. Drink plenty of fluids and get plenty of rest. Avoid close contacts especially the very young and the elderly Cover your mouth if you cough or cough into your sleeve. Always remember to wash your hands A steam or ultrasonic humidifier can help congestion.   GET HELP RIGHT AWAY IF: You develop worsening fever. You become short of breath You cough up blood. Your symptoms persist after you have completed your treatment plan MAKE SURE YOU  Understand these instructions. Will watch your condition. Will get help right away if you are not doing well or get worse.    Thank you for choosing an e-visit.  Your e-visit answers were reviewed by a board certified advanced clinical practitioner to complete your personal care plan. Depending upon the condition, your plan could have included both over the counter or prescription medications.  Please review your pharmacy choice. Make sure the pharmacy is open so you can pick up prescription now. If there is a problem, you may contact your provider through [employer] and have the prescription routed to another pharmacy.  Your safety is important to [place]. If you have drug allergies check your prescription carefully.   For the next 24 hours you can use MyChart to ask questions about today's visit, request a non-urgent call back, or ask for a work or school excuse. You will get an email in the next two days asking about your experience. I hope that your e-visit has been valuable and will speed your recovery.

## 2023-11-23 NOTE — Progress Notes (Signed)
 I have spent 5 minutes in review of e-visit questionnaire, review and updating patient chart, medical decision making and response to patient.   Piedad Climes, PA-C

## 2023-11-30 ENCOUNTER — Telehealth: Admitting: Family Medicine

## 2023-11-30 ENCOUNTER — Other Ambulatory Visit (HOSPITAL_COMMUNITY): Payer: Self-pay

## 2023-11-30 DIAGNOSIS — B9689 Other specified bacterial agents as the cause of diseases classified elsewhere: Secondary | ICD-10-CM

## 2023-11-30 MED ORDER — AZITHROMYCIN 250 MG PO TABS
ORAL_TABLET | ORAL | 0 refills | Status: AC
  Filled 2023-11-30: qty 6, 5d supply, fill #0

## 2023-11-30 NOTE — Progress Notes (Signed)
 E-Visit for Cough   We are sorry that you are not feeling well.  Here is how we plan to help!  Based on your presentation I believe you most likely have A cough due to bacteria.  When patients have a fever and a productive cough with a change in color or increased sputum production, we are concerned about bacterial bronchitis.  If left untreated it can progress to pneumonia.  If your symptoms do not improve with your treatment plan it is important that you contact your provider.   I have prescribed Azithromyin 250 mg: two tablets now and then one tablet daily for 4 additonal days    In addition you may use A non-prescription cough medication called Mucinex DM: take 2 tablets every 12 hours.   From your responses in the eVisit questionnaire you describe inflammation in the upper respiratory tract which is causing a significant cough.  This is commonly called Bronchitis and has four common causes:   Allergies Viral Infections Acid Reflux Bacterial Infection Allergies, viruses and acid reflux are treated by controlling symptoms or eliminating the cause. An example might be a cough caused by taking certain blood pressure medications. You stop the cough by changing the medication. Another example might be a cough caused by acid reflux. Controlling the reflux helps control the cough.  USE OF BRONCHODILATOR ("RESCUE") INHALERS: There is a risk from using your bronchodilator too frequently.  The risk is that over-reliance on a medication which only relaxes the muscles surrounding the breathing tubes can reduce the effectiveness of medications prescribed to reduce swelling and congestion of the tubes themselves.  Although you feel brief relief from the bronchodilator inhaler, your asthma may actually be worsening with the tubes becoming more swollen and filled with mucus.  This can delay other crucial treatments, such as oral steroid medications. If you need to use a bronchodilator inhaler daily, several  times per day, you should discuss this with your provider.  There are probably better treatments that could be used to keep your asthma under control.     HOME CARE Only take medications as instructed by your medical team. Complete the entire course of an antibiotic. Drink plenty of fluids and get plenty of rest. Avoid close contacts especially the very young and the elderly Cover your mouth if you cough or cough into your sleeve. Always remember to wash your hands A steam or ultrasonic humidifier can help congestion.   GET HELP RIGHT AWAY IF: You develop worsening fever. You become short of breath You cough up blood. Your symptoms persist after you have completed your treatment plan MAKE SURE YOU  Understand these instructions. Will watch your condition. Will get help right away if you are not doing well or get worse.    Thank you for choosing an e-visit.  Your e-visit answers were reviewed by a board certified advanced clinical practitioner to complete your personal care plan. Depending upon the condition, your plan could have included both over the counter or prescription medications.  Please review your pharmacy choice. Make sure the pharmacy is open so you can pick up prescription now. If there is a problem, you may contact your provider through [employer] and have the prescription routed to another pharmacy.  Your safety is important to [place]. If you have drug allergies check your prescription carefully.   For the next 24 hours you can use MyChart to ask questions about today's visit, request a non-urgent call back, or ask for a work  or school excuse. You will get an email in the next two days asking about your experience. I hope that your e-visit has been valuable and will speed your recovery.    I provided 5 minutes of non face-to-face time during this encounter for chart review, medication and order placement, as well as and documentation.

## 2023-12-08 ENCOUNTER — Telehealth: Admitting: Family Medicine

## 2023-12-08 ENCOUNTER — Other Ambulatory Visit (HOSPITAL_COMMUNITY): Payer: Self-pay

## 2023-12-08 DIAGNOSIS — J111 Influenza due to unidentified influenza virus with other respiratory manifestations: Secondary | ICD-10-CM

## 2023-12-08 MED ORDER — OSELTAMIVIR PHOSPHATE 75 MG PO CAPS: 75.0000 mg | ORAL_CAPSULE | Freq: Two times a day (BID) | ORAL | 0 refills | Status: AC

## 2023-12-08 NOTE — Progress Notes (Signed)

## 2023-12-10 ENCOUNTER — Telehealth: Admitting: Physician Assistant

## 2023-12-10 DIAGNOSIS — R062 Wheezing: Secondary | ICD-10-CM

## 2023-12-10 NOTE — Progress Notes (Signed)

## 2023-12-11 ENCOUNTER — Ambulatory Visit: Admitting: Podiatry

## 2023-12-26 ENCOUNTER — Telehealth: Admitting: Physician Assistant

## 2023-12-26 DIAGNOSIS — M545 Low back pain, unspecified: Secondary | ICD-10-CM | POA: Diagnosis not present

## 2023-12-26 MED ORDER — NAPROXEN 500 MG PO TABS
500.0000 mg | ORAL_TABLET | Freq: Two times a day (BID) | ORAL | 0 refills | Status: DC
Start: 2023-12-26 — End: 2024-02-05

## 2023-12-26 MED ORDER — CYCLOBENZAPRINE HCL 10 MG PO TABS: 10.0000 mg | ORAL_TABLET | Freq: Three times a day (TID) | ORAL | 0 refills | Status: DC | PRN

## 2023-12-26 NOTE — Progress Notes (Signed)

## 2023-12-26 NOTE — Progress Notes (Signed)
 I have spent 5 minutes in review of e-visit questionnaire, review and updating patient chart, medical decision making and response to patient.   Piedad Climes, PA-C

## 2024-01-15 ENCOUNTER — Other Ambulatory Visit (HOSPITAL_COMMUNITY): Payer: Self-pay

## 2024-01-15 ENCOUNTER — Encounter: Payer: Self-pay | Admitting: Family

## 2024-01-15 ENCOUNTER — Ambulatory Visit: Payer: Commercial Managed Care - PPO | Admitting: Family

## 2024-01-15 VITALS — BP 170/80 | HR 92 | Temp 97.5°F | Ht 69.0 in | Wt 294.0 lb

## 2024-01-15 DIAGNOSIS — I10 Essential (primary) hypertension: Secondary | ICD-10-CM | POA: Diagnosis not present

## 2024-01-15 DIAGNOSIS — Z0000 Encounter for general adult medical examination without abnormal findings: Secondary | ICD-10-CM | POA: Diagnosis not present

## 2024-01-15 DIAGNOSIS — D509 Iron deficiency anemia, unspecified: Secondary | ICD-10-CM

## 2024-01-15 DIAGNOSIS — Z0001 Encounter for general adult medical examination with abnormal findings: Secondary | ICD-10-CM | POA: Diagnosis not present

## 2024-01-15 DIAGNOSIS — Z6841 Body Mass Index (BMI) 40.0 and over, adult: Secondary | ICD-10-CM

## 2024-01-15 LAB — LIPID PANEL

## 2024-01-15 MED ORDER — LISINOPRIL-HYDROCHLOROTHIAZIDE 20-12.5 MG PO TABS
1.0000 | ORAL_TABLET | Freq: Every day | ORAL | 3 refills | Status: AC
  Filled 2024-01-15: qty 90, 90d supply, fill #0
  Filled 2024-04-10: qty 90, 90d supply, fill #1
  Filled 2024-06-13 – 2024-07-09 (×3): qty 90, 90d supply, fill #2
  Filled 2024-08-05 – 2024-10-03 (×2): qty 90, 90d supply, fill #0

## 2024-01-15 NOTE — Progress Notes (Signed)
 Subjective:    Patient ID: Linda Wilson, female    DOB: 07/06/1971, 53 y.o.   MRN: [id number]  Chief Complaint  Patient presents with   Follow-up   Pt presents to the office today for CPE and follow up on anemia.   She is followed by Dermatologists every 3 months for eczema and psoriasis. She is currently taking Skyrizi . Anemia Presents for follow-up visit. There has been no bruising/bleeding easily, fever, malaise/fatigue or weight loss.  Hypertension This is a new problem. The current episode started more than 1 month ago. The problem is unchanged. The problem is uncontrolled. Associated symptoms include headaches. Pertinent negatives include no malaise/fatigue, peripheral edema or shortness of breath. Risk factors for coronary artery disease include obesity.      Review of Systems  Constitutional:  Negative for fever, malaise/fatigue and weight loss.  Respiratory:  Negative for shortness of breath.   Neurological:  Positive for headaches.  Hematological:  Does not bruise/bleed easily.  All other systems reviewed and are negative.   Social History   Socioeconomic History   Marital status: Single    Spouse name: Not on file   Number of children: Not on file   Years of education: Not on file   Highest education level: 12th grade  Occupational History   Not on file  Tobacco Use   Smoking status: Never   Smokeless tobacco: Never  Vaping Use   Vaping status: Never Used  Substance and Sexual Activity   Alcohol use: No   Drug use: No   Sexual activity: Not Currently  Other Topics Concern   Not on file  Social History Narrative   Not on file   Social Drivers of Health   Financial Resource Strain: Low Risk  (01/09/2024)   Overall Financial Resource Strain (CARDIA)    Difficulty of Paying Living Expenses: Not very hard  Food Insecurity: No Food Insecurity (01/09/2024)   Hunger Vital Sign    Worried About Running Out of Food in the Last Year: Never true    Ran  Out of Food in the Last Year: Never true  Transportation Needs: No Transportation Needs (01/09/2024)   PRAPARE - Administrator, Civil Service (Medical): No    Lack of Transportation (Non-Medical): No  Physical Activity: Unknown (01/09/2024)   Exercise Vital Sign    Days of Exercise per Week: 0 days    Minutes of Exercise per Session: Not on file  Stress: No Stress Concern Present (01/09/2024)   [employer] of Occupational Health - Occupational Stress Questionnaire    Feeling of Stress : Not at all  Social Connections: Unknown (01/09/2024)   Social Connection and Isolation Panel [NHANES]    Frequency of Communication with Friends and Family: More than three times a week    Frequency of Social Gatherings with Friends and Family: Once a week    Attends Religious Services: Patient declined    Database administrator or Organizations: No    Attends Engineer, structural: Not on file    Marital Status: Never married   Family History  Problem Relation Age of Onset   Healthy Mother    Heart disease Father        MI   Diabetes Father    Stroke Father    Colon polyps Neg Hx    Colon cancer Neg Hx    Esophageal cancer Neg Hx    Rectal cancer Neg Hx    Stomach cancer  Neg Hx         Objective:   Physical Exam Vitals reviewed.  Constitutional:      General: She is not in acute distress.    Appearance: She is well-developed. She is obese.  HENT:     Head: Normocephalic and atraumatic.     Right Ear: Tympanic membrane normal.     Left Ear: Tympanic membrane normal.  Eyes:     Pupils: Pupils are equal, round, and reactive to light.  Neck:     Thyroid : No thyromegaly.  Cardiovascular:     Rate and Rhythm: Normal rate and regular rhythm.     Heart sounds: Normal heart sounds. No murmur heard. Pulmonary:     Effort: Pulmonary effort is normal. No respiratory distress.     Breath sounds: Normal breath sounds. No wheezing.  Abdominal:     General: Bowel sounds  are normal. There is no distension.     Palpations: Abdomen is soft.     Tenderness: There is no abdominal tenderness.  Musculoskeletal:        General: No tenderness. Normal range of motion.     Cervical back: Normal range of motion and neck supple.  Skin:    General: Skin is warm and dry.  Neurological:     Mental Status: She is alert and oriented to person, place, and time.     Cranial Nerves: No cranial nerve deficit.     Deep Tendon Reflexes: Reflexes are normal and symmetric.  Psychiatric:        Behavior: Behavior normal.        Thought Content: Thought content normal.        Judgment: Judgment normal.       BP (!) 170/80   Pulse 92   Temp (!) 97.5 F (36.4 C) (Temporal)   Ht 5\' 9"  (1.753 m)   Wt 294 lb (133.4 kg)   LMP 11/09/2014   SpO2 95%   BMI 43.42 kg/m      Assessment & Plan:  Roosevelt L Ackert comes in today with chief complaint of Follow-up   Diagnosis and orders addressed:  1. Obesity, Class III, BMI 40-49.9 (morbid obesity) (HCC) - CMP14+EGFR - Ambulatory referral to Sleep Studies  2. Annual physical exam (Primary) - CMP14+EGFR - Anemia Profile B - Lipid panel  3. Iron deficiency anemia, unspecified iron deficiency anemia type Labs pending  - CMP14+EGFR - Anemia Profile B  4. Primary hypertension Will start Lisinopril - hydrochlorothiazide  20-12.5 mg today -Daily blood pressure log given with instructions on how to fill out and told to bring to next visit -Dash diet information given -Exercise encouraged - Stress Management  -Continue current meds -RTO in 3 weeks  - lisinopril -hydrochlorothiazide  (ZESTORETIC ) 20-12.5 MG tablet; Take 1 tablet by mouth daily.  Dispense: 90 tablet; Refill: 3 - Ambulatory referral to Sleep Studies   Labs pending Will start Lisinopril - hydrochlorothiazide  20-12.5 mg today Continue current medications  Keep follow up with specialists  Health Maintenance reviewed Diet and exercise encouraged  Return in  about 3 weeks (around 02/05/2024), or if symptoms worsen or fail to improve.    Tommas Fragmin, FNP

## 2024-01-15 NOTE — Patient Instructions (Addendum)
 Iron Deficiency Anemia, Adult  Iron deficiency anemia is a condition in which the concentration of red blood cells or hemoglobin in the blood is below normal because of too little iron. Hemoglobin is a substance in red blood cells that carries oxygen to the body's tissues. When the concentration of red blood cells or hemoglobin is too low, not enough oxygen reaches these tissues. Iron deficiency anemia is usually long-lasting, and it develops over time. It may or may not cause symptoms. It is a common type of anemia. What are the causes? This condition may be caused by: Not enough iron in the diet. Abnormal absorption in the gut. Blood loss. What increases the risk? You are more likely to develop this condition if you get menstrual periods (menstruate) or are pregnant. What are the signs or symptoms? Symptoms of this condition may include: Pale skin, lips, and nail beds. Weakness, dizziness, and getting tired easily. Shortness of breath when moving or exercising. Cold hands or feet. Mild anemia may not cause any symptoms. How is this diagnosed? This condition is diagnosed based on: Your medical history. A physical exam. Blood tests. How is this treated? This condition is treated by correcting the cause of your iron deficiency. Treatment may involve: Adding iron-rich foods to your diet. Taking iron supplements. If you are pregnant or breastfeeding, you may need to take extra iron because your normal diet usually does not provide the amount of iron that you need. Increasing vitamin C intake. Vitamin C helps your body absorb iron. Your health care provider may recommend that you take iron supplements along with a glass of orange juice or a vitamin C supplement. Medicines to make heavy menstrual flow lighter. Surgery or additional testing procedures to determine the cause of your anemia. You may need repeat blood tests to determine whether treatment is working. If the treatment does not  seem to be working, you may need more tests. Follow these instructions at home: Medicines Take over-the-counter and prescription medicines only as told by your health care provider. This includes iron supplements and vitamins. This is important because too much iron can be harmful. For the best iron absorption, you should take iron supplements when your stomach is empty. If you cannot tolerate them on an empty stomach, you may need to take them with food. Do not drink milk or take antacids at the same time as your iron supplements. Milk and antacids may interfere with how your body absorbs iron. Iron supplements may turn stool (feces) a darker color and it may appear black. If you cannot tolerate taking iron supplements by mouth, talk with your health care provider about taking them through an IV or through an injection into a muscle. Eating and drinking Talk with your health care provider before changing your diet. Your provider may recommend that you eat foods that contain a lot of iron, such as: Liver. Low-fat (lean) beef. Breads and cereals that have iron added to them (are fortified). Eggs. Dried fruit. Dark green, leafy vegetables. To help your body use the iron from iron-rich foods, eat those foods at the same time as fresh fruits and vegetables that are high in vitamin C. Foods that are high in vitamin C include: Oranges. Peppers. Tomatoes. Mangoes. Managing constipation If you are taking an iron supplement, it may cause constipation. To prevent or treat constipation, you may need to: Drink enough fluid to keep your urine pale yellow. Take over-the-counter or prescription medicines. Eat foods that are high in fiber, such  as beans, whole grains, and fresh fruits and vegetables. Limit foods that are high in fat and processed sugars, such as fried or sweet foods. General instructions Return to your normal activities as told by your health care provider. Ask your health care provider  what activities are safe for you. Keep all follow-up visits. Contact a health care provider if: You feel nauseous or you vomit. You feel weak. You become light-headed when getting up from a sitting or lying down position. You have unexplained sweating. You develop symptoms of constipation. You have a heaviness in your chest. You have trouble breathing with physical activity. Get help right away if: You faint. If this happens, do not drive yourself to the hospital. You have an irregular or rapid heartbeat. Summary Iron deficiency anemia is a condition in which the concentration of red blood cells or hemoglobin in the blood is below normal because of too little iron. This condition is treated by correcting the cause of your iron deficiency. Take over-the-counter and prescription medicines only as told by your health care provider. This includes iron supplements and vitamins. To help your body use the iron from iron-rich foods, eat those foods at the same time as fresh fruits and vegetables that are high in vitamin C. Seek medical help if you have signs or symptoms of worsening anemia. This information is not intended to replace advice given to you by your health care provider. Make sure you discuss any questions you have with your health care provider.  Hypertension, Adult High blood pressure (hypertension) is when the force of blood pumping through the arteries is too strong. The arteries are the blood vessels that carry blood from the heart throughout the body. Hypertension forces the heart to work harder to pump blood and may cause arteries to become narrow or stiff. Untreated or uncontrolled hypertension can lead to a heart attack, heart failure, a stroke, kidney disease, and other problems. A blood pressure reading consists of a higher number over a lower number. Ideally, your blood pressure should be below 120/80. The first ("top") number is called the systolic pressure. It is a measure of  the pressure in your arteries as your heart beats. The second ("bottom") number is called the diastolic pressure. It is a measure of the pressure in your arteries as the heart relaxes. What are the causes? The exact cause of this condition is not known. There are some conditions that result in high blood pressure. What increases the risk? Certain factors may make you more likely to develop high blood pressure. Some of these risk factors are under your control, including: Smoking. Not getting enough exercise or physical activity. Being overweight. Having too much fat, sugar, calories, or salt (sodium) in your diet. Drinking too much alcohol. Other risk factors include: Having a personal history of heart disease, diabetes, high cholesterol, or kidney disease. Stress. Having a family history of high blood pressure and high cholesterol. Having obstructive sleep apnea. Age. The risk increases with age. What are the signs or symptoms? High blood pressure may not cause symptoms. Very high blood pressure (hypertensive crisis) may cause: Headache. Fast or irregular heartbeats (palpitations). Shortness of breath. Nosebleed. Nausea and vomiting. Vision changes. Severe chest pain, dizziness, and seizures. How is this diagnosed? This condition is diagnosed by measuring your blood pressure while you are seated, with your arm resting on a flat surface, your legs uncrossed, and your feet flat on the floor. The cuff of the blood pressure monitor will be placed  directly against the skin of your upper arm at the level of your heart. Blood pressure should be measured at least twice using the same arm. Certain conditions can cause a difference in blood pressure between your right and left arms. If you have a high blood pressure reading during one visit or you have normal blood pressure with other risk factors, you may be asked to: Return on a different day to have your blood pressure checked again. Monitor  your blood pressure at home for 1 week or longer. If you are diagnosed with hypertension, you may have other blood or imaging tests to help your health care provider understand your overall risk for other conditions. How is this treated? This condition is treated by making healthy lifestyle changes, such as eating healthy foods, exercising more, and reducing your alcohol intake. You may be referred for counseling on a healthy diet and physical activity. Your health care provider may prescribe medicine if lifestyle changes are not enough to get your blood pressure under control and if: Your systolic blood pressure is above 130. Your diastolic blood pressure is above 80. Your personal target blood pressure may vary depending on your medical conditions, your age, and other factors. Follow these instructions at home: Eating and drinking  Eat a diet that is high in fiber and potassium, and low in sodium, added sugar, and fat. An example of this eating plan is called the DASH diet. DASH stands for Dietary Approaches to Stop Hypertension. To eat this way: Eat plenty of fresh fruits and vegetables. Try to fill one half of your plate at each meal with fruits and vegetables. Eat whole grains, such as whole-wheat pasta, brown rice, or whole-grain bread. Fill about one fourth of your plate with whole grains. Eat or drink low-fat dairy products, such as skim milk or low-fat yogurt. Avoid fatty cuts of meat, processed or cured meats, and poultry with skin. Fill about one fourth of your plate with lean proteins, such as fish, chicken without skin, beans, eggs, or tofu. Avoid pre-made and processed foods. These tend to be higher in sodium, added sugar, and fat. Reduce your daily sodium intake. Many people with hypertension should eat less than 1,500 mg of sodium a day. Do not drink alcohol if: Your health care provider tells you not to drink. You are pregnant, may be pregnant, or are planning to become  pregnant. If you drink alcohol: Limit how much you have to: 0-1 drink a day for women. 0-2 drinks a day for men. Know how much alcohol is in your drink. In the [place] one drink equals one 12 oz bottle of beer (355 mL), one 5 oz glass of wine (148 mL), or one 1 oz glass of hard liquor (44 mL). Lifestyle  Work with your health care provider to maintain a healthy body weight or to lose weight. Ask what an ideal weight is for you. Get at least 30 minutes of exercise that causes your heart to beat faster (aerobic exercise) most days of the week. Activities may include walking, swimming, or biking. Include exercise to strengthen your muscles (resistance exercise), such as Pilates or lifting weights, as part of your weekly exercise routine. Try to do these types of exercises for 30 minutes at least 3 days a week. Do not use any products that contain nicotine or tobacco. These products include cigarettes, chewing tobacco, and vaping devices, such as e-cigarettes. If you need help quitting, ask your health care provider. Monitor your blood pressure  at home as told by your health care provider. Keep all follow-up visits. This is important. Medicines Take over-the-counter and prescription medicines only as told by your health care provider. Follow directions carefully. Blood pressure medicines must be taken as prescribed. Do not skip doses of blood pressure medicine. Doing this puts you at risk for problems and can make the medicine less effective. Ask your health care provider about side effects or reactions to medicines that you should watch for. Contact a health care provider if you: Think you are having a reaction to a medicine you are taking. Have headaches that keep coming back (recurring). Feel dizzy. Have swelling in your ankles. Have trouble with your vision. Get help right away if you: Develop a severe headache or confusion. Have unusual weakness or numbness. Feel faint. Have severe pain  in your chest or abdomen. Vomit repeatedly. Have trouble breathing. These symptoms may be an emergency. Get help right away. Call 911. Do not wait to see if the symptoms will go away. Do not drive yourself to the hospital. Summary Hypertension is when the force of blood pumping through your arteries is too strong. If this condition is not controlled, it may put you at risk for serious complications. Your personal target blood pressure may vary depending on your medical conditions, your age, and other factors. For most people, a normal blood pressure is less than 120/80. Hypertension is treated with lifestyle changes, medicines, or a combination of both. Lifestyle changes include losing weight, eating a healthy, low-sodium diet, exercising more, and limiting alcohol. This information is not intended to replace advice given to you by your health care provider. Make sure you discuss any questions you have with your health care provider. Document Revised: 07/20/2021 Document Reviewed: 07/20/2021 Elsevier Patient Education  2024 [employer]. Document Revised: 10/20/2021 Document Reviewed: 10/20/2021 Elsevier Patient Education  2024 [employer].

## 2024-01-16 ENCOUNTER — Other Ambulatory Visit: Payer: Self-pay | Admitting: Family

## 2024-01-16 ENCOUNTER — Other Ambulatory Visit (HOSPITAL_COMMUNITY): Payer: Self-pay

## 2024-01-16 DIAGNOSIS — D509 Iron deficiency anemia, unspecified: Secondary | ICD-10-CM

## 2024-01-16 DIAGNOSIS — E785 Hyperlipidemia, unspecified: Secondary | ICD-10-CM | POA: Insufficient documentation

## 2024-01-16 LAB — ANEMIA PROFILE B
Basophils Absolute: 0.1 10*3/uL (ref 0.0–0.2)
Basos: 1 %
EOS (ABSOLUTE): 0.3 10*3/uL (ref 0.0–0.4)
Eos: 4 %
Ferritin: 25 ng/mL (ref 15–150)
Folate: 6.5 ng/mL (ref >3.0)
Hematocrit: 41.3 % (ref 34.0–46.6)
Hemoglobin: 11.7 g/dL (ref 11.1–15.9)
Immature Grans (Abs): 0.1 10*3/uL (ref 0.0–0.1)
Immature Granulocytes: 1 %
Iron Saturation: 5 % — CL (ref 15–55)
Iron: 22 ug/dL — ABNORMAL LOW (ref 27–159)
Lymphocytes Absolute: 2.5 10*3/uL (ref 0.7–3.1)
Lymphs: 33 %
MCH: 21 pg — ABNORMAL LOW (ref 26.6–33.0)
MCHC: 28.3 g/dL — ABNORMAL LOW (ref 31.5–35.7)
MCV: 74 fL — ABNORMAL LOW (ref 79–97)
Monocytes Absolute: 0.7 10*3/uL (ref 0.1–0.9)
Monocytes: 9 %
Neutrophils Absolute: 4.1 10*3/uL (ref 1.4–7.0)
Neutrophils: 52 %
Platelets: 284 10*3/uL (ref 150–450)
RBC: 5.58 x10E6/uL — ABNORMAL HIGH (ref 3.77–5.28)
RDW: 20.1 % — ABNORMAL HIGH (ref 11.7–15.4)
Retic Ct Pct: 1.8 % (ref 0.6–2.6)
Total Iron Binding Capacity: 425 ug/dL (ref 250–450)
UIBC: 403 ug/dL (ref 131–425)
Vitamin B-12: 1243 pg/mL (ref 232–1245)
WBC: 7.7 10*3/uL (ref 3.4–10.8)

## 2024-01-16 LAB — LIPID PANEL
Cholesterol, Total: 147 mg/dL (ref 100–199)
HDL: 21 mg/dL — ABNORMAL LOW (ref >39)
LDL CALC COMMENT:: 7 ratio — ABNORMAL HIGH (ref 0.0–4.4)
LDL Chol Calc (NIH): 74 mg/dL (ref 0–99)
Triglycerides: 321 mg/dL — ABNORMAL HIGH (ref 0–149)
VLDL Cholesterol Cal: 52 mg/dL — ABNORMAL HIGH (ref 5–40)

## 2024-01-16 LAB — CMP14+EGFR
ALT: 32 IU/L (ref 0–32)
AST: 44 IU/L — ABNORMAL HIGH (ref 0–40)
Albumin: 4.5 g/dL (ref 3.8–4.9)
Alkaline Phosphatase: 111 IU/L (ref 44–121)
BUN/Creatinine Ratio: 11 (ref 9–23)
BUN: 7 mg/dL (ref 6–24)
Bilirubin Total: 0.6 mg/dL (ref 0.0–1.2)
CO2: 22 mmol/L (ref 20–29)
Calcium: 9.7 mg/dL (ref 8.7–10.2)
Chloride: 101 mmol/L (ref 96–106)
Creatinine, Ser: 0.64 mg/dL (ref 0.57–1.00)
Globulin, Total: 3.2 g/dL (ref 1.5–4.5)
Glucose: 107 mg/dL — ABNORMAL HIGH (ref 70–99)
Potassium: 4.1 mmol/L (ref 3.5–5.2)
Sodium: 139 mmol/L (ref 134–144)
Total Protein: 7.7 g/dL (ref 6.0–8.5)
eGFR: 106 mL/min/{1.73_m2} (ref >59)

## 2024-01-16 MED ORDER — FERROUS SULFATE 325 (65 FE) MG PO TBEC
325.0000 mg | DELAYED_RELEASE_TABLET | Freq: Two times a day (BID) | ORAL | 3 refills | Status: DC
  Filled 2024-01-16: qty 60, 30d supply, fill #0
  Filled 2024-02-14: qty 60, 30d supply, fill #1
  Filled 2024-03-19: qty 60, 30d supply, fill #2
  Filled 2024-04-10: qty 60, 30d supply, fill #3

## 2024-01-16 MED ORDER — ATORVASTATIN CALCIUM 20 MG PO TABS
20.0000 mg | ORAL_TABLET | Freq: Every day | ORAL | 3 refills | Status: AC
  Filled 2024-01-16: qty 90, 90d supply, fill #0
  Filled 2024-04-10: qty 90, 90d supply, fill #1
  Filled 2024-06-13 – 2024-07-09 (×3): qty 90, 90d supply, fill #2
  Filled 2024-08-05 – 2024-10-03 (×2): qty 90, 90d supply, fill #0

## 2024-01-24 ENCOUNTER — Other Ambulatory Visit (HOSPITAL_COMMUNITY): Payer: Self-pay

## 2024-01-24 ENCOUNTER — Encounter (HOSPITAL_COMMUNITY): Payer: Self-pay

## 2024-01-24 ENCOUNTER — Telehealth: Admitting: Physician Assistant

## 2024-01-24 DIAGNOSIS — R6889 Other general symptoms and signs: Secondary | ICD-10-CM

## 2024-01-24 DIAGNOSIS — R110 Nausea: Secondary | ICD-10-CM

## 2024-01-24 DIAGNOSIS — R21 Rash and other nonspecific skin eruption: Secondary | ICD-10-CM

## 2024-01-24 NOTE — Progress Notes (Signed)
   Thank you for the details you included in the comment boxes. Those details are very helpful in determining the best course of treatment for you and help us  to provide the best care.Because of some atypical flu-like symptoms that need more discussion, we recommend that you schedule a Virtual Urgent Care video visit in order for the provider to better assess what is going on.  The provider will be able to give you a more accurate diagnosis and treatment plan if we can more freely discuss your symptoms and with the addition of a virtual examination.   If you change your visit to a video visit, we will bill your insurance (similar to an office visit) and you will not be charged for this e-Visit. You will be able to stay at home and speak with the first available [facility] advanced practice provider. The link to do a video visit is in the drop down Menu tab of your Welcome screen in MyChart.

## 2024-01-25 ENCOUNTER — Other Ambulatory Visit: Payer: Self-pay

## 2024-01-25 NOTE — Progress Notes (Signed)
 Specialty Pharmacy Refill Coordination Note  Linda Wilson is a 53 y.o. female contacted today regarding refills of specialty medication(s) Risankizumab -rzaa (Skyrizi )   Patient requested (Patient-Rptd) Delivery   Delivery date: (Patient-Rptd) 02/07/24   Verified address: (Patient-Rptd) [address] [place] [address]   Medication will be filled on 02/06/24.

## 2024-01-25 NOTE — Progress Notes (Signed)
 Clinical Intervention Note  Clinical Intervention Notes: Patient started on lisinopril  and Lipitor, no DDIs identified.   Clinical Intervention Outcomes: Prevention of an adverse drug event   Celinda Collar

## 2024-02-05 ENCOUNTER — Ambulatory Visit: Admitting: Family

## 2024-02-05 ENCOUNTER — Encounter: Payer: Self-pay | Admitting: Family

## 2024-02-05 VITALS — BP 138/71 | HR 88 | Temp 97.5°F | Ht 69.0 in | Wt 292.6 lb

## 2024-02-05 DIAGNOSIS — I10 Essential (primary) hypertension: Secondary | ICD-10-CM

## 2024-02-05 NOTE — Patient Instructions (Signed)
 Hypertension, Adult High blood pressure (hypertension) is when the force of blood pumping through the arteries is too strong. The arteries are the blood vessels that carry blood from the heart throughout the body. Hypertension forces the heart to work harder to pump blood and may cause arteries to become narrow or stiff. Untreated or uncontrolled hypertension can lead to a heart attack, heart failure, a stroke, kidney disease, and other problems. A blood pressure reading consists of a higher number over a lower number. Ideally, your blood pressure should be below 120/80. The first ("top") number is called the systolic pressure. It is a measure of the pressure in your arteries as your heart beats. The second ("bottom") number is called the diastolic pressure. It is a measure of the pressure in your arteries as the heart relaxes. What are the causes? The exact cause of this condition is not known. There are some conditions that result in high blood pressure. What increases the risk? Certain factors may make you more likely to develop high blood pressure. Some of these risk factors are under your control, including: Smoking. Not getting enough exercise or physical activity. Being overweight. Having too much fat, sugar, calories, or salt (sodium) in your diet. Drinking too much alcohol. Other risk factors include: Having a personal history of heart disease, diabetes, high cholesterol, or kidney disease. Stress. Having a family history of high blood pressure and high cholesterol. Having obstructive sleep apnea. Age. The risk increases with age. What are the signs or symptoms? High blood pressure may not cause symptoms. Very high blood pressure (hypertensive crisis) may cause: Headache. Fast or irregular heartbeats (palpitations). Shortness of breath. Nosebleed. Nausea and vomiting. Vision changes. Severe chest pain, dizziness, and seizures. How is this diagnosed? This condition is diagnosed by  measuring your blood pressure while you are seated, with your arm resting on a flat surface, your legs uncrossed, and your feet flat on the floor. The cuff of the blood pressure monitor will be placed directly against the skin of your upper arm at the level of your heart. Blood pressure should be measured at least twice using the same arm. Certain conditions can cause a difference in blood pressure between your right and left arms. If you have a high blood pressure reading during one visit or you have normal blood pressure with other risk factors, you may be asked to: Return on a different day to have your blood pressure checked again. Monitor your blood pressure at home for 1 week or longer. If you are diagnosed with hypertension, you may have other blood or imaging tests to help your health care provider understand your overall risk for other conditions. How is this treated? This condition is treated by making healthy lifestyle changes, such as eating healthy foods, exercising more, and reducing your alcohol intake. You may be referred for counseling on a healthy diet and physical activity. Your health care provider may prescribe medicine if lifestyle changes are not enough to get your blood pressure under control and if: Your systolic blood pressure is above 130. Your diastolic blood pressure is above 80. Your personal target blood pressure may vary depending on your medical conditions, your age, and other factors. Follow these instructions at home: Eating and drinking  Eat a diet that is high in fiber and potassium, and low in sodium, added sugar, and fat. An example of this eating plan is called the DASH diet. DASH stands for Dietary Approaches to Stop Hypertension. To eat this way: Eat  plenty of fresh fruits and vegetables. Try to fill one half of your plate at each meal with fruits and vegetables. Eat whole grains, such as whole-wheat pasta, brown rice, or whole-grain bread. Fill about one  fourth of your plate with whole grains. Eat or drink low-fat dairy products, such as skim milk or low-fat yogurt. Avoid fatty cuts of meat, processed or cured meats, and poultry with skin. Fill about one fourth of your plate with lean proteins, such as fish, chicken without skin, beans, eggs, or tofu. Avoid pre-made and processed foods. These tend to be higher in sodium, added sugar, and fat. Reduce your daily sodium intake. Many people with hypertension should eat less than 1,500 mg of sodium a day. Do not drink alcohol if: Your health care provider tells you not to drink. You are pregnant, may be pregnant, or are planning to become pregnant. If you drink alcohol: Limit how much you have to: 0-1 drink a day for women. 0-2 drinks a day for men. Know how much alcohol is in your drink. In the [place] one drink equals one 12 oz bottle of beer (355 mL), one 5 oz glass of wine (148 mL), or one 1 oz glass of hard liquor (44 mL). Lifestyle  Work with your health care provider to maintain a healthy body weight or to lose weight. Ask what an ideal weight is for you. Get at least 30 minutes of exercise that causes your heart to beat faster (aerobic exercise) most days of the week. Activities may include walking, swimming, or biking. Include exercise to strengthen your muscles (resistance exercise), such as Pilates or lifting weights, as part of your weekly exercise routine. Try to do these types of exercises for 30 minutes at least 3 days a week. Do not use any products that contain nicotine or tobacco. These products include cigarettes, chewing tobacco, and vaping devices, such as e-cigarettes. If you need help quitting, ask your health care provider. Monitor your blood pressure at home as told by your health care provider. Keep all follow-up visits. This is important. Medicines Take over-the-counter and prescription medicines only as told by your health care provider. Follow directions carefully. Blood  pressure medicines must be taken as prescribed. Do not skip doses of blood pressure medicine. Doing this puts you at risk for problems and can make the medicine less effective. Ask your health care provider about side effects or reactions to medicines that you should watch for. Contact a health care provider if you: Think you are having a reaction to a medicine you are taking. Have headaches that keep coming back (recurring). Feel dizzy. Have swelling in your ankles. Have trouble with your vision. Get help right away if you: Develop a severe headache or confusion. Have unusual weakness or numbness. Feel faint. Have severe pain in your chest or abdomen. Vomit repeatedly. Have trouble breathing. These symptoms may be an emergency. Get help right away. Call 911. Do not wait to see if the symptoms will go away. Do not drive yourself to the hospital. Summary Hypertension is when the force of blood pumping through your arteries is too strong. If this condition is not controlled, it may put you at risk for serious complications. Your personal target blood pressure may vary depending on your medical conditions, your age, and other factors. For most people, a normal blood pressure is less than 120/80. Hypertension is treated with lifestyle changes, medicines, or a combination of both. Lifestyle changes include losing weight, eating a healthy,  low-sodium diet, exercising more, and limiting alcohol. This information is not intended to replace advice given to you by your health care provider. Make sure you discuss any questions you have with your health care provider. Document Revised: 07/20/2021 Document Reviewed: 07/20/2021 Elsevier Patient Education  2024 [employer].

## 2024-02-05 NOTE — Progress Notes (Signed)
 Subjective:    Patient ID: Linda Wilson, female    DOB: 09/25/1971, 53 y.o.   MRN: [id number]  Chief Complaint  Patient presents with   Follow-up   Pt presents to the office today to recheck HTN. She was seen on 01/15/24 and was started on Lisinopril /hydrochlorothiazide . Her BP is elevated today, but at home was 138/76.  Hypertension This is a chronic problem. The current episode started more than 1 year ago. The problem has been resolved since onset. The problem is controlled. Associated symptoms include peripheral edema. Pertinent negatives include no malaise/fatigue or shortness of breath. Risk factors for coronary artery disease include dyslipidemia and obesity.      Review of Systems  Constitutional:  Negative for malaise/fatigue.  Respiratory:  Negative for shortness of breath.   All other systems reviewed and are negative.   Social History   Socioeconomic History   Marital status: Single    Spouse name: Not on file   Number of children: Not on file   Years of education: Not on file   Highest education level: 12th grade  Occupational History   Not on file  Tobacco Use   Smoking status: Never   Smokeless tobacco: Never  Vaping Use   Vaping status: Never Used  Substance and Sexual Activity   Alcohol use: No   Drug use: No   Sexual activity: Not Currently  Other Topics Concern   Not on file  Social History Narrative   Not on file   Social Drivers of Health   Financial Resource Strain: Low Risk  (01/09/2024)   Overall Financial Resource Strain (CARDIA)    Difficulty of Paying Living Expenses: Not very hard  Food Insecurity: No Food Insecurity (01/09/2024)   Hunger Vital Sign    Worried About Running Out of Food in the Last Year: Never true    Ran Out of Food in the Last Year: Never true  Transportation Needs: No Transportation Needs (01/09/2024)   PRAPARE - Administrator, Civil Service (Medical): No    Lack of Transportation (Non-Medical): No   Physical Activity: Unknown (01/09/2024)   Exercise Vital Sign    Days of Exercise per Week: 0 days    Minutes of Exercise per Session: Not on file  Stress: No Stress Concern Present (01/09/2024)   [employer] of Occupational Health - Occupational Stress Questionnaire    Feeling of Stress : Not at all  Social Connections: Unknown (01/09/2024)   Social Connection and Isolation Panel [NHANES]    Frequency of Communication with Friends and Family: More than three times a week    Frequency of Social Gatherings with Friends and Family: Once a week    Attends Religious Services: Patient declined    Database administrator or Organizations: No    Attends Engineer, structural: Not on file    Marital Status: Never married   Family History  Problem Relation Age of Onset   Healthy Mother    Heart disease Father        MI   Diabetes Father    Stroke Father    Colon polyps Neg Hx    Colon cancer Neg Hx    Esophageal cancer Neg Hx    Rectal cancer Neg Hx    Stomach cancer Neg Hx         Objective:   Physical Exam Vitals reviewed.  Constitutional:      General: She is not in acute distress.  Appearance: She is well-developed.  HENT:     Head: Normocephalic and atraumatic.     Right Ear: Tympanic membrane normal.     Left Ear: Tympanic membrane normal.  Eyes:     Pupils: Pupils are equal, round, and reactive to light.  Neck:     Thyroid : No thyromegaly.  Cardiovascular:     Rate and Rhythm: Normal rate and regular rhythm.     Heart sounds: Normal heart sounds. No murmur heard. Pulmonary:     Effort: Pulmonary effort is normal. No respiratory distress.     Breath sounds: Normal breath sounds. No wheezing.  Abdominal:     General: Bowel sounds are normal. There is no distension.     Palpations: Abdomen is soft.     Tenderness: There is no abdominal tenderness.  Musculoskeletal:        General: No tenderness. Normal range of motion.     Cervical back: Normal range  of motion and neck supple.  Skin:    General: Skin is warm and dry.  Neurological:     Mental Status: She is alert and oriented to person, place, and time.     Cranial Nerves: No cranial nerve deficit.     Deep Tendon Reflexes: Reflexes are normal and symmetric.  Psychiatric:        Behavior: Behavior normal.        Thought Content: Thought content normal.        Judgment: Judgment normal.       BP 138/71   Pulse 88   Temp (!) 97.5 F (36.4 C) (Temporal)   Ht 5\' 9"  (1.753 m)   Wt 292 lb 9.6 oz (132.7 kg)   LMP 11/09/2014   SpO2 98%   BMI 43.21 kg/m      Assessment & Plan:  Linda Wilson comes in today with chief complaint of Follow-up   Diagnosis and orders addressed:  1. Primary hypertension (Primary) -Continue current medications  -Dash diet information given -Exercise encouraged - Stress Management  -RTO in 4 months  - BMP8+EGFR    Return in about 4 months (around 06/07/2024), or if symptoms worsen or fail to improve.    Tommas Fragmin, FNP

## 2024-02-06 ENCOUNTER — Other Ambulatory Visit: Payer: Self-pay

## 2024-02-06 ENCOUNTER — Ambulatory Visit: Payer: Self-pay | Admitting: Family

## 2024-02-06 ENCOUNTER — Other Ambulatory Visit: Payer: Self-pay | Admitting: Family

## 2024-02-06 LAB — BMP8+EGFR
BUN/Creatinine Ratio: 16 (ref 9–23)
BUN: 10 mg/dL (ref 6–24)
CO2: 24 mmol/L (ref 20–29)
Calcium: 9.6 mg/dL (ref 8.7–10.2)
Chloride: 99 mmol/L (ref 96–106)
Creatinine, Ser: 0.64 mg/dL (ref 0.57–1.00)
Glucose: 88 mg/dL (ref 70–99)
Potassium: 4.4 mmol/L (ref 3.5–5.2)
Sodium: 141 mmol/L (ref 134–144)
eGFR: 106 mL/min/{1.73_m2} (ref >59)

## 2024-02-06 NOTE — Progress Notes (Signed)
 HEMATOLOGY/ONCOLOGY CONSULTATION NOTE  Date of Service: 02/07/2024  Patient Care Team: Yevette Hem, FNP as PCP - General (Family Medicine) Claiborne Crew, MD as Consulting Physician (Orthopedic Surgery) Meisinger, Ena Harries, MD as Consulting Physician (Obstetrics and Gynecology) Caralyn Chandler, MD as Consulting Physician (General Surgery)  CHIEF COMPLAINTS/PURPOSE OF CONSULTATION:  Iron deficiency  HISTORY OF PRESENTING ILLNESS:   Linda Wilson is a wonderful 53 y.o. female who has been referred to us  by Yevette Hem, FNP for evaluation and management of iron deficiency.  Patient reports that she has had iron deficiency since her cholecystectomy February 2024.   She is currently taking ferrous sulfate , one tablet (325 MG total) twice a day. She has been on oral iron managed by PCP since January 2025, which she is tolerating well. Patient noted to be responding to oral iron. Hgb improved from 10s three months ago to 11s three weeks ago.   Patient notes taking stool softener for constipation, which has improved.   She denies any other reason for iron deficiency in the past. Her menstrual periods ended at age 25. Patient notes that when she did have periods previously, they were heavy and there were concerns of iron deficiency at that time. Her menstrual losses was not ever severe enough for there to be considerations of IV iron or blood transfusions in the past.   She denies any concern for not absorbing iron well.  She reports that she previously endorsed ice cravings, but this has improved since taking oral iron.   Patient denies any other source of blood loss, such as nose bleeds, gum bleeds, black stools, blood in stools, or blood in the urine.   Patient reports that she is UTD with her colonoscopies, and her last colonoscopy in October was normal.   She complains of feeling more tired on some days, though there is improvement in her tiredness more recently with oral  iron.   She denies any other signifcant changes in how she has felt recently.  She continues to be on Skyrizi  since October 2024 for psoriasis. Patient reports having skin breakouts only in the skin of her upper extremities with her psoriasis.   Patient denies any leg swelling, back pain, or abdominal pain.  She is not taking acid suppressants or multivitamin. Patient is taking vitamin B12 supplementation.   She denies any concern for gluten intolerance.   Patient denies any fhx of blood disorders. She reports that her maternal grandmother did have hx of cancer in her 30s, with metastasis and she is unsure of the origin.   MEDICAL HISTORY:  Past Medical History:  Diagnosis Date   Allergy    Cancer (HCC)    basal cell   Morbid (severe) obesity due to excess calories (HCC) 07/07/2023   bmi 41.97   Nephrolithiasis 10/20/2022    SURGICAL HISTORY: Past Surgical History:  Procedure Laterality Date   BIOPSY  11/02/2022   Procedure: BIOPSY;  Surgeon: Annis Kinder, DO;  Location: [facility];  Service: Gastroenterology;;   CESAREAN SECTION     CHOLECYSTECTOMY N/A 11/17/2022   Procedure: LAPAROSCOPIC CHOLECYSTECTOMY WITH INTRAOPERATIVE CHOLANGIOGRAM;  Surgeon: Oralee Billow, MD;  Location: [facility];  Service: General;  Laterality: N/A;   ESOPHAGOGASTRODUODENOSCOPY (EGD) WITH PROPOFOL  N/A 11/02/2022   Procedure: ESOPHAGOGASTRODUODENOSCOPY (EGD) WITH PROPOFOL ;  Surgeon: Annis Kinder, DO;  Location: [facility];  Service: Gastroenterology;  Laterality: N/A;   INCISION AND DRAINAGE ABSCESS  02/15/2007   pubic abscess - I&D Dr Alethea Andes  LIVER BIOPSY N/A 11/17/2022   Procedure: LIVER BIOPSY;  Surgeon: Oralee Billow, MD;  Location: [facility];  Service: General;  Laterality: N/A;   TUBAL LIGATION  09/16/2005   Cyd Dowse, MD    SOCIAL HISTORY: Social History   Socioeconomic History   Marital status: Single    Spouse name: Not on file   Number of children: Not on file   Years of  education: Not on file   Highest education level: 12th grade  Occupational History   Not on file  Tobacco Use   Smoking status: Never   Smokeless tobacco: Never  Vaping Use   Vaping status: Never Used  Substance and Sexual Activity   Alcohol use: No   Drug use: No   Sexual activity: Not Currently  Other Topics Concern   Not on file  Social History Narrative   Not on file   Social Drivers of Health   Financial Resource Strain: Low Risk  (01/09/2024)   Overall Financial Resource Strain (CARDIA)    Difficulty of Paying Living Expenses: Not very hard  Food Insecurity: No Food Insecurity (01/09/2024)   Hunger Vital Sign    Worried About Running Out of Food in the Last Year: Never true    Ran Out of Food in the Last Year: Never true  Transportation Needs: No Transportation Needs (01/09/2024)   PRAPARE - Administrator, Civil Service (Medical): No    Lack of Transportation (Non-Medical): No  Physical Activity: Unknown (01/09/2024)   Exercise Vital Sign    Days of Exercise per Week: 0 days    Minutes of Exercise per Session: Not on file  Stress: No Stress Concern Present (01/09/2024)   [employer] of Occupational Health - Occupational Stress Questionnaire    Feeling of Stress : Not at all  Social Connections: Unknown (01/09/2024)   Social Connection and Isolation Panel [NHANES]    Frequency of Communication with Friends and Family: More than three times a week    Frequency of Social Gatherings with Friends and Family: Once a week    Attends Religious Services: Patient declined    Database administrator or Organizations: No    Attends Engineer, structural: Not on file    Marital Status: Never married  Intimate Partner Violence: Not At Risk (11/01/2022)   Humiliation, Afraid, Rape, and Kick questionnaire    Fear of Current or Ex-Partner: No    Emotionally Abused: No    Physically Abused: No    Sexually Abused: No    FAMILY HISTORY: Family History   Problem Relation Age of Onset   Healthy Mother    Heart disease Father        MI   Diabetes Father    Stroke Father    Colon polyps Neg Hx    Colon cancer Neg Hx    Esophageal cancer Neg Hx    Rectal cancer Neg Hx    Stomach cancer Neg Hx     ALLERGIES:  is allergic to deucravacitinib.  MEDICATIONS:  Current Outpatient Medications  Medication Sig Dispense Refill   atorvastatin  (LIPITOR) 20 MG tablet Take 1 tablet (20 mg total) by mouth daily. 90 tablet 3   calcipotriene  (DOVONOX) 0.005 % cream Apply 1 Application topically 2 (two) times daily. 60 g 1   clobetasol  cream (TEMOVATE ) 0.05 % Apply 1 Application topically 2 (two) times daily. 45 g 1   ferrous sulfate  325 (65 FE) MG EC tablet Take 1 tablet (325  mg total) by mouth 2 (two) times daily. 60 tablet 3   glucosamine-chondroitin 500-400 MG tablet Take 1 tablet by mouth daily.     lisinopril -hydrochlorothiazide  (ZESTORETIC ) 20-12.5 MG tablet Take 1 tablet by mouth daily. 90 tablet 3   loratadine (CLARITIN) 10 MG tablet Take 10 mg by mouth daily.     ondansetron  (ZOFRAN ) 4 MG tablet Take 1 tablet (4 mg total) by mouth every 8 (eight) hours as needed for nausea or vomiting. (Patient taking differently: Take 4 mg by mouth daily as needed for nausea or vomiting.) 60 tablet 0   risankizumab -rzaa (SKYRIZI ) 150 MG/ML SOSY prefilled syringe Inject 1 syringe subcutaneously every three months 1 mL 3   No current facility-administered medications for this visit.    REVIEW OF SYSTEMS:    10 Point review of Systems was done is negative except as noted above.  PHYSICAL EXAMINATION: ECOG PERFORMANCE STATUS: 1 - Symptomatic but completely ambulatory  . Vitals:   02/07/24 1531  BP: (!) 150/73  Pulse: 83  Resp: 18  Temp: 97.9 F (36.6 C)  SpO2: 98%   .Body mass index is 43.21 kg/m.  GENERAL:alert, in no acute distress and comfortable SKIN: no acute rashes, no significant lesions EYES: conjunctiva are pink and non-injected,  sclera anicteric OROPHARYNX: MMM, no exudates, no oropharyngeal erythema or ulceration NECK: supple, no JVD LYMPH:  no palpable lymphadenopathy in the cervical, axillary or inguinal regions LUNGS: clear to auscultation b/l with normal respiratory effort HEART: regular rate & rhythm ABDOMEN:  normoactive bowel sounds , non tender, not distended. Extremity: no pedal edema PSYCH: alert & oriented x 3 with fluent speech NEURO: no focal motor/sensory deficits  LABORATORY DATA:  I have reviewed the data as listed  .    Latest Ref Rng & Units 01/15/2024    4:01 PM 11/10/2023    3:37 PM 11/15/2022    2:13 PM  CBC  WBC 3.4 - 10.8 x10E3/uL 7.7  8.4  7.8   Hemoglobin 11.1 - 15.9 g/dL 82.9  56.2  13.0   Hematocrit 34.0 - 46.6 % 41.3  36.5  35.9   Platelets 150 - 450 x10E3/uL 284  304  364     .    Latest Ref Rng & Units 02/05/2024    4:09 PM 01/15/2024    4:01 PM 11/02/2022    3:48 AM  CMP  Glucose 70 - 99 mg/dL 88  865  94   BUN 6 - 24 mg/dL 10  7  <5   Creatinine 0.57 - 1.00 mg/dL 7.84  6.96  2.95   Sodium 134 - 144 mmol/L 141  139  139   Potassium 3.5 - 5.2 mmol/L 4.4  4.1  3.7   Chloride 96 - 106 mmol/L 99  101  103   CO2 20 - 29 mmol/L 24  22  28    Calcium  8.7 - 10.2 mg/dL 9.6  9.7  8.3   Total Protein 6.0 - 8.5 g/dL  7.7  6.5   Total Bilirubin 0.0 - 1.2 mg/dL  0.6  0.9   Alkaline Phos 44 - 121 IU/L  111  58   AST 0 - 40 IU/L  44  21   ALT 0 - 32 IU/L  32  12      RADIOGRAPHIC STUDIES: I have personally reviewed the radiological images as listed and agreed with the findings in the report. No results found.  ASSESSMENT & PLAN:  53 y.o. female with:  Iron deficiency Anemia  Possibly from previous blood loss with gall bladder surgery.  PLAN: -anemia profile on 01/15/2024 shows iron saturation very low at 5%  -ferritin improved based on last labs from 15 to 25 -labs also showed HGB 11.7 and HCT 41.3% -her hgb is noted to be improving from 10s to 11s. Discussed that it is  quite likely that her iron levels may be improving too. Recommend oral iron until she makes up her iron requirements  -discussed that inflammation, such as from psoriasis, can sometimes make iron saturation levels appear to be lower than actual levels and ferritin (which is an acute phase reactant) look higher than normal due to inflammation. -discussed that the bone marrow uses iron and can cause fluctuations in ferritin levels -discussed ferritin goal of at least 50, closer to 100 -discussed iron saturation goal of 20-30%  -discussed that depending on how she is feeling from iron deficiency, the severity of her anemia, and how well she tolerates oral iron, she would be given the option to replace her iron levels either orally or with IV iron.  -discussed that iron polysaccharide generally is better tolerated among the different oral iron preparations -discussed pros and potential cons with IV iron. Discussed that IV iron can replace iron levels more quickly. IV iron is generally tolerated well. There is a small risk of allergic reactions.  -will order labs  -patient reports no concern for ongoing blood loss to cause low iron -discussed that as long as her iron labs keep improving, hgb is not too low, and fatigue improves, there would not be a strong push towards IV iron -continue oral iron as prescribed at this time -will plan for phone visit to discuss lab results  -answered all of patient's questions in detail  . Orders Placed This Encounter  Procedures   CBC with Differential (Cancer Center Only)    Standing Status:   Future    Number of Occurrences:   1    Expected Date:   02/07/2024    Expiration Date:   02/06/2025   Ferritin    Standing Status:   Future    Number of Occurrences:   1    Expected Date:   02/07/2024    Expiration Date:   02/06/2025   Iron and Iron Binding Capacity ([facility] only)    Standing Status:   Future    Number of Occurrences:   1    Expected Date:    02/07/2024    Expiration Date:   02/06/2025   Sedimentation rate    Standing Status:   Future    Number of Occurrences:   1    Expected Date:   02/07/2024    Expiration Date:   02/06/2025    FOLLOW-UP: Labs today Phone visit with Dr Salomon Cree in 1 week  The total time spent in the appointment was 45 minutes* .  All of the patient's questions were answered with apparent satisfaction. The patient knows to call the clinic with any problems, questions or concerns.   Jacquelyn Matt MD MS AAHIVMS [facility] Hematology/Oncology Physician [facility]  .*Total Encounter Time as defined by the Centers for Medicare and Medicaid Services includes, in addition to the face-to-face time of a patient visit (documented in the note above) non-face-to-face time: obtaining and reviewing outside history, ordering and reviewing medications, tests or procedures, care coordination (communications with other health care professionals or caregivers) and documentation in the medical record.    I,Mitra Faeizi,acting as a Neurosurgeon for  Jacquelyn Matt, MD.,have documented all relevant documentation on the behalf of Jacquelyn Matt, MD,as directed by  Jacquelyn Matt, MD while in the presence of Jacquelyn Matt, MD.  .I have reviewed the above documentation for accuracy and completeness, and I agree with the above. .Gautam Kishore Kale MD

## 2024-02-07 ENCOUNTER — Inpatient Hospital Stay

## 2024-02-07 ENCOUNTER — Inpatient Hospital Stay: Attending: Hematology | Admitting: Hematology

## 2024-02-07 VITALS — BP 150/73 | HR 83 | Temp 97.9°F | Resp 18 | Ht 69.0 in

## 2024-02-07 DIAGNOSIS — D509 Iron deficiency anemia, unspecified: Secondary | ICD-10-CM | POA: Diagnosis not present

## 2024-02-07 LAB — CBC WITH DIFFERENTIAL (CANCER CENTER ONLY)
Abs Immature Granulocytes: 0.05 10*3/uL (ref 0.00–0.07)
Basophils Absolute: 0.1 10*3/uL (ref 0.0–0.1)
Basophils Relative: 1 %
Eosinophils Absolute: 0.4 10*3/uL (ref 0.0–0.5)
Eosinophils Relative: 4 %
HCT: 40.3 % (ref 36.0–46.0)
Hemoglobin: 12.4 g/dL (ref 12.0–15.0)
Immature Granulocytes: 1 %
Lymphocytes Relative: 33 %
Lymphs Abs: 3.3 10*3/uL (ref 0.7–4.0)
MCH: 22.8 pg — ABNORMAL LOW (ref 26.0–34.0)
MCHC: 30.8 g/dL (ref 30.0–36.0)
MCV: 74.2 fL — ABNORMAL LOW (ref 80.0–100.0)
Monocytes Absolute: 0.8 10*3/uL (ref 0.1–1.0)
Monocytes Relative: 8 %
Neutro Abs: 5.5 10*3/uL (ref 1.7–7.7)
Neutrophils Relative %: 53 %
Platelet Count: 300 10*3/uL (ref 150–400)
RBC: 5.43 MIL/uL — ABNORMAL HIGH (ref 3.87–5.11)
RDW: 18.5 % — ABNORMAL HIGH (ref 11.5–15.5)
WBC Count: 10 10*3/uL (ref 4.0–10.5)
nRBC: 0 % (ref 0.0–0.2)

## 2024-02-07 LAB — IRON AND IRON BINDING CAPACITY (CC-WL,HP ONLY)
Iron: 36 ug/dL (ref 28–170)
Saturation Ratios: 7 % — ABNORMAL LOW (ref 10.4–31.8)
TIBC: 518 ug/dL — ABNORMAL HIGH (ref 250–450)
UIBC: 482 ug/dL

## 2024-02-07 LAB — SEDIMENTATION RATE: Sed Rate: 17 mm/h (ref 0–22)

## 2024-02-08 ENCOUNTER — Telehealth: Payer: Self-pay | Admitting: Hematology

## 2024-02-08 LAB — FERRITIN: Ferritin: 25 ng/mL (ref 11–307)

## 2024-02-08 NOTE — Telephone Encounter (Signed)
 Spoke with patient confirming upcoming appointment

## 2024-02-12 ENCOUNTER — Other Ambulatory Visit (HOSPITAL_COMMUNITY): Payer: Self-pay

## 2024-02-13 ENCOUNTER — Inpatient Hospital Stay (HOSPITAL_BASED_OUTPATIENT_CLINIC_OR_DEPARTMENT_OTHER): Admitting: Hematology

## 2024-02-13 ENCOUNTER — Other Ambulatory Visit (HOSPITAL_COMMUNITY): Payer: Self-pay

## 2024-02-13 DIAGNOSIS — D509 Iron deficiency anemia, unspecified: Secondary | ICD-10-CM | POA: Diagnosis not present

## 2024-02-13 MED ORDER — CLOBETASOL PROPIONATE 0.05 % EX CREA
1.0000 | TOPICAL_CREAM | Freq: Two times a day (BID) | CUTANEOUS | 0 refills | Status: DC
  Filled 2024-02-13: qty 45, 30d supply, fill #0

## 2024-02-13 NOTE — Progress Notes (Signed)
 HEMATOLOGY/ONCOLOGY PHONE NOTE  Date of Service: 02/13/2024  Patient Care Team: Yevette Hem, FNP as PCP - General (Family Medicine) Claiborne Crew, MD as Consulting Physician (Orthopedic Surgery) Meisinger, Ena Harries, MD as Consulting Physician (Obstetrics and Gynecology) Caralyn Chandler, MD as Consulting Physician (General Surgery)  CHIEF COMPLAINTS/PURPOSE OF CONSULTATION:  Iron deficiency   HISTORY OF PRESENTING ILLNESS:    Linda Wilson is a wonderful 53 y.o. female who has been referred to us  by Yevette Hem, FNP for evaluation and management of iron deficiency.   Patient reports that she has had iron deficiency since her cholecystectomy February 2024.    She is currently taking ferrous sulfate , one tablet (325 MG total) twice a day. She has been on oral iron managed by PCP since January 2025, which she is tolerating well. Patient noted to be responding to oral iron. Hgb improved from 10s three months ago to 11s three weeks ago.    Patient notes taking stool softener for constipation, which has improved.    She denies any other reason for iron deficiency in the past. Her menstrual periods ended at age 58. Patient notes that when she did have periods previously, they were heavy and there were concerns of iron deficiency at that time. Her menstrual losses was not ever severe enough for there to be considerations of IV iron or blood transfusions in the past.    She denies any concern for not absorbing iron well.   She reports that she previously endorsed ice cravings, but this has improved since taking oral iron.    Patient denies any other source of blood loss, such as nose bleeds, gum bleeds, black stools, blood in stools, or blood in the urine.    Patient reports that she is UTD with her colonoscopies, and her last colonoscopy in October was normal.    She complains of feeling more tired on some days, though there is improvement in her tiredness more recently with oral  iron.    She denies any other signifcant changes in how she has felt recently.   She continues to be on Skyrizi  since October 2024 for psoriasis. Patient reports having skin breakouts only in the skin of her upper extremities with her psoriasis.    Patient denies any leg swelling, back pain, or abdominal pain.   She is not taking acid suppressants or multivitamin. Patient is taking vitamin B12 supplementation.    She denies any concern for gluten intolerance.    Patient denies any fhx of blood disorders. She reports that her maternal grandmother did have hx of cancer in her 79s, with metastasis and she is unsure of the origin.   INTERVAL HISTORY:  Linda Wilson is a 53 y.o. female who is being connected with via telemedicine visit for iron deficiency. She was initially seen by me on 02/07/2024 and skin rashes in upper extremities from psoriasis. She reported improved constipation, ice cravings, and tiredness.   I connected with Megann L Faye on 02/13/24 at  3:30 PM EDT by telephone visit and verified that I am speaking with the correct person using two identifiers.   I discussed the limitations, risks, security and privacy concerns of performing an evaluation and management service by telemedicine and the availability of in-person appointments. I also discussed with the patient that there may be a patient responsible charge related to this service. The patient expressed understanding and agreed to proceed.   Other persons participating in the visit and their  role in the encounter: none   Patient's location: home  Provider's location: [facility]   Chief Complaint: iron deficiency    The results of her recent lab workup from 02/07/2024 was discussed with her in detail.    MEDICAL HISTORY:  Past Medical History:  Diagnosis Date   Allergy    Cancer (HCC)    basal cell   Morbid (severe) obesity due to excess calories (HCC) 07/07/2023   bmi 41.97   Nephrolithiasis 10/20/2022     SURGICAL HISTORY: Past Surgical History:  Procedure Laterality Date   BIOPSY  11/02/2022   Procedure: BIOPSY;  Surgeon: Annis Kinder, DO;  [facility]: [facility];  Service: Gastroenterology;;   CESAREAN SECTION     CHOLECYSTECTOMY N/A 11/17/2022   Procedure: LAPAROSCOPIC CHOLECYSTECTOMY WITH INTRAOPERATIVE CHOLANGIOGRAM;  Surgeon: Oralee Billow, MD;  Location: [facility];  Service: General;  Laterality: N/A;   ESOPHAGOGASTRODUODENOSCOPY (EGD) WITH PROPOFOL  N/A 11/02/2022   Procedure: ESOPHAGOGASTRODUODENOSCOPY (EGD) WITH PROPOFOL ;  Surgeon: Annis Kinder, DO;  Location: [facility];  Service: Gastroenterology;  Laterality: N/A;   INCISION AND DRAINAGE ABSCESS  02/15/2007   pubic abscess - I&D Dr Alethea Andes   LIVER BIOPSY N/A 11/17/2022   Procedure: LIVER BIOPSY;  Surgeon: Oralee Billow, MD;  Location: [facility];  Service: General;  Laterality: N/A;   TUBAL LIGATION  09/16/2005   Cyd Dowse, MD    SOCIAL HISTORY: Social History   Socioeconomic History   Marital status: Single    Spouse name: Not on file   Number of children: Not on file   Years of education: Not on file   Highest education level: 12th grade  Occupational History   Not on file  Tobacco Use   Smoking status: Never   Smokeless tobacco: Never  Vaping Use   Vaping status: Never Used  Substance and Sexual Activity   Alcohol use: No   Drug use: No   Sexual activity: Not Currently  Other Topics Concern   Not on file  Social History Narrative   Not on file   Social Drivers of Health   Financial Resource Strain: Low Risk  (01/09/2024)   Overall Financial Resource Strain (CARDIA)    Difficulty of Paying Living Expenses: Not very hard  Food Insecurity: No Food Insecurity (01/09/2024)   Hunger Vital Sign    Worried About Running Out of Food in the Last Year: Never true    Ran Out of Food in the Last Year: Never true  Transportation Needs: No Transportation Needs (01/09/2024)   PRAPARE - Scientist, research (physical sciences) (Medical): No    Lack of Transportation (Non-Medical): No  Physical Activity: Unknown (01/09/2024)   Exercise Vital Sign    Days of Exercise per Week: 0 days    Minutes of Exercise per Session: Not on file  Stress: No Stress Concern Present (01/09/2024)   [employer] of Occupational Health - Occupational Stress Questionnaire    Feeling of Stress : Not at all  Social Connections: Unknown (01/09/2024)   Social Connection and Isolation Panel [NHANES]    Frequency of Communication with Friends and Family: More than three times a week    Frequency of Social Gatherings with Friends and Family: Once a week    Attends Religious Services: Patient declined    Active Member of Clubs or Organizations: No    Attends Banker Meetings: Not on file    Marital Status: Never married  Intimate Partner Violence: Not At Risk (11/01/2022)  Humiliation, Afraid, Rape, and Kick questionnaire    Fear of Current or Ex-Partner: No    Emotionally Abused: No    Physically Abused: No    Sexually Abused: No    FAMILY HISTORY: Family History  Problem Relation Age of Onset   Healthy Mother    Heart disease Father        MI   Diabetes Father    Stroke Father    Colon polyps Neg Hx    Colon cancer Neg Hx    Esophageal cancer Neg Hx    Rectal cancer Neg Hx    Stomach cancer Neg Hx     ALLERGIES:  is allergic to deucravacitinib.  MEDICATIONS:  Current Outpatient Medications  Medication Sig Dispense Refill   atorvastatin  (LIPITOR) 20 MG tablet Take 1 tablet (20 mg total) by mouth daily. 90 tablet 3   calcipotriene  (DOVONOX) 0.005 % cream Apply 1 Application topically 2 (two) times daily. 60 g 1   clobetasol  cream (TEMOVATE ) 0.05 % Apply 1 Application topically 2 (two) times daily. 45 g 1   ferrous sulfate  325 (65 FE) MG EC tablet Take 1 tablet (325 mg total) by mouth 2 (two) times daily. 60 tablet 3   glucosamine-chondroitin 500-400 MG tablet Take 1 tablet by mouth daily.      lisinopril -hydrochlorothiazide  (ZESTORETIC ) 20-12.5 MG tablet Take 1 tablet by mouth daily. 90 tablet 3   loratadine (CLARITIN) 10 MG tablet Take 10 mg by mouth daily.     ondansetron  (ZOFRAN ) 4 MG tablet Take 1 tablet (4 mg total) by mouth every 8 (eight) hours as needed for nausea or vomiting. (Patient taking differently: Take 4 mg by mouth daily as needed for nausea or vomiting.) 60 tablet 0   risankizumab -rzaa (SKYRIZI ) 150 MG/ML SOSY prefilled syringe Inject 1 syringe subcutaneously every three months 1 mL 3   No current facility-administered medications for this visit.    REVIEW OF SYSTEMS:    10 Point review of Systems was done is negative except as noted above.  PHYSICAL EXAMINATION: TELEMEDICINE VISIT ECOG PERFORMANCE STATUS: {CHL ONC ECOG ZO:[id number]}  .There were no vitals filed for this visit. There were no vitals filed for this visit. .There is no height or weight on file to calculate BMI.   LABORATORY DATA:  I have reviewed the data as listed  .    Latest Ref Rng & Units 02/07/2024    4:02 PM 01/15/2024    4:01 PM 11/10/2023    3:37 PM  CBC  WBC 4.0 - 10.5 K/uL 10.0  7.7  8.4   Hemoglobin 12.0 - 15.0 g/dL 81.1  91.4  78.2   Hematocrit 36.0 - 46.0 % 40.3  41.3  36.5   Platelets 150 - 400 K/uL 300  284  304     .    Latest Ref Rng & Units 02/05/2024    4:09 PM 01/15/2024    4:01 PM 11/02/2022    3:48 AM  CMP  Glucose 70 - 99 mg/dL 88  956  94   BUN 6 - 24 mg/dL 10  7  <5   Creatinine 0.57 - 1.00 mg/dL 2.13  0.86  5.78   Sodium 134 - 144 mmol/L 141  139  139   Potassium 3.5 - 5.2 mmol/L 4.4  4.1  3.7   Chloride 96 - 106 mmol/L 99  101  103   CO2 20 - 29 mmol/L 24  22  28    Calcium  8.7 - 10.2 mg/dL  9.6  9.7  8.3   Total Protein 6.0 - 8.5 g/dL  7.7  6.5   Total Bilirubin 0.0 - 1.2 mg/dL  0.6  0.9   Alkaline Phos 44 - 121 IU/L  111  58   AST 0 - 40 IU/L  44  21   ALT 0 - 32 IU/L  32  12      RADIOGRAPHIC STUDIES: I have personally reviewed the  radiological images as listed and agreed with the findings in the report. No results found.  ASSESSMENT & PLAN:  54 y.o. female with:   Iron deficiency   PLAN:   -discussed lab results from 02/07/2024 in detail with patient. CBC showed WBC 10K, hemoglobin 12.4, and platelets 300K -hemoglobin continues to improve -ferritin 25 -iron saturation 7% -Sedimentation rate 17 -continue oral iron  ]   FOLLOW-UP: ***   The total time spent in the appointment was *** minutes* .  All of the patient's questions were answered with apparent satisfaction. The patient knows to call the clinic with any problems, questions or concerns.   Jacquelyn Matt MD MS AAHIVMS [facility] Hematology/Oncology Physician [facility]  .*Total Encounter Time as defined by the Centers for Medicare and Medicaid Services includes, in addition to the face-to-face time of a patient visit (documented in the note above) non-face-to-face time: obtaining and reviewing outside history, ordering and reviewing medications, tests or procedures, care coordination (communications with other health care professionals or caregivers) and documentation in the medical record.      I,Mitra Faeizi,acting as a Neurosurgeon for Jacquelyn Matt, MD.,have documented all relevant documentation on the behalf of Jacquelyn Matt, MD,as directed by  Jacquelyn Matt, MD while in the presence of Jacquelyn Matt, MD.   ***

## 2024-02-14 ENCOUNTER — Other Ambulatory Visit: Payer: Self-pay

## 2024-02-14 ENCOUNTER — Other Ambulatory Visit (HOSPITAL_COMMUNITY): Payer: Self-pay

## 2024-02-14 ENCOUNTER — Telehealth: Admitting: Physician Assistant

## 2024-02-14 DIAGNOSIS — H6011 Cellulitis of right external ear: Secondary | ICD-10-CM

## 2024-02-14 MED ORDER — CEPHALEXIN 500 MG PO CAPS
500.0000 mg | ORAL_CAPSULE | Freq: Four times a day (QID) | ORAL | 0 refills | Status: DC
  Filled 2024-02-14: qty 20, 5d supply, fill #0

## 2024-02-14 NOTE — Progress Notes (Signed)

## 2024-02-20 NOTE — Addendum Note (Signed)
 Addended by: Jacquelyn Matt on: 02/20/2024 01:22 AM   Modules accepted: Level of Service

## 2024-02-23 ENCOUNTER — Encounter (INDEPENDENT_AMBULATORY_CARE_PROVIDER_SITE_OTHER): Payer: Self-pay

## 2024-02-23 ENCOUNTER — Telehealth: Admitting: Physician Assistant

## 2024-02-23 ENCOUNTER — Other Ambulatory Visit (HOSPITAL_COMMUNITY): Payer: Self-pay

## 2024-02-23 DIAGNOSIS — N200 Calculus of kidney: Secondary | ICD-10-CM | POA: Diagnosis not present

## 2024-02-23 DIAGNOSIS — R300 Dysuria: Secondary | ICD-10-CM

## 2024-02-23 DIAGNOSIS — R109 Unspecified abdominal pain: Secondary | ICD-10-CM

## 2024-02-23 MED ORDER — TAMSULOSIN HCL 0.4 MG PO CAPS
0.4000 mg | ORAL_CAPSULE | Freq: Every day | ORAL | 3 refills | Status: AC
  Filled 2024-02-23 – 2024-08-05 (×2): qty 30, 30d supply, fill #0

## 2024-02-23 NOTE — Progress Notes (Signed)
  Because of potential kidney stone, I feel your condition warrants further evaluation and I recommend that you be seen in a face-to-face visit. It is recommended to have imaging to rule out a stone, or to verify the size to make sure you are able to pass it without issue.   NOTE: There will be NO CHARGE for this E-Visit   If you are having a true medical emergency, please call 911.     For an urgent face to face visit, [facility] has multiple urgent care centers for your convenience.  Click the link below for the full list of locations and hours, walk-in wait times, appointment scheduling options and driving directions:  Urgent Care - [place], [place], [place], [place], [place], [place]  [place]     Your MyChart E-visit questionnaire answers were reviewed by a board certified advanced clinical practitioner to complete your personal care plan based on your specific symptoms.    Thank you for using e-Visits.      I have spent 5 minutes in review of e-visit questionnaire, review and updating patient chart, medical decision making and response to patient.   Angelia Kelp, PA-C

## 2024-02-23 NOTE — Telephone Encounter (Signed)
 Hello, I have sent in Flomax . Make sure you are forcing fluids. Let me know if you have any other problems.   Tommas Fragmin, FNP  Approximately 5 minutes was spent documenting and reviewing patient's chart.

## 2024-02-28 ENCOUNTER — Other Ambulatory Visit: Payer: Self-pay

## 2024-02-29 ENCOUNTER — Telehealth: Admitting: Physician Assistant

## 2024-02-29 ENCOUNTER — Other Ambulatory Visit (HOSPITAL_COMMUNITY): Payer: Self-pay

## 2024-02-29 ENCOUNTER — Other Ambulatory Visit: Payer: Self-pay

## 2024-02-29 DIAGNOSIS — L259 Unspecified contact dermatitis, unspecified cause: Secondary | ICD-10-CM

## 2024-02-29 MED ORDER — HYDROCORTISONE 1 % EX OINT: 1.0000 | TOPICAL_OINTMENT | Freq: Two times a day (BID) | CUTANEOUS | 0 refills | Status: AC

## 2024-02-29 MED ORDER — HYDROXYZINE PAMOATE 25 MG PO CAPS: 25.0000 mg | ORAL_CAPSULE | Freq: Every evening | ORAL | 0 refills | Status: AC

## 2024-02-29 NOTE — Progress Notes (Signed)
 E Visit for Rash  We are sorry that you are not feeling well. Here is how we plan to help!  Based on what you shared with me it looks like you have contact dermatitis.  Contact dermatitis is a skin rash caused by something that touches the skin and causes irritation or inflammation.  Your skin may be red, swollen, dry, cracked, and itch.  The rash should go away in a few days but can last a few weeks.  If you get a rash, it's important to figure out what caused it so the irritant can be avoided in the future.  I have prescribed a topical steroid ointment and an oral antihistamine.   HOME CARE:  Take cool showers and avoid direct sunlight. Apply cool compress or wet dressings. Take a bath in an oatmeal bath.  Sprinkle content of one Aveeno packet under running faucet with comfortably warm water.  Bathe for 15-20 minutes, 1-2 times daily.  Pat dry with a towel. Do not rub the rash. Use hydrocortisone cream. Take an antihistamine like Benadryl for widespread rashes that itch.  The adult dose of Benadryl is 25-50 mg by mouth 4 times daily. Caution:  This type of medication may cause sleepiness.  Do not drink alcohol, drive, or operate dangerous machinery while taking antihistamines.  Do not take these medications if you have prostate enlargement.  Read package instructions thoroughly on all medications that you take.  GET HELP RIGHT AWAY IF:  Symptoms don't go away after treatment. Severe itching that persists. If you rash spreads or swells. If you rash begins to smell. If it blisters and opens or develops a yellow-brown crust. You develop a fever. You have a sore throat. You become short of breath.  MAKE SURE YOU:  Understand these instructions. Will watch your condition. Will get help right away if you are not doing well or get worse.  Thank you for choosing an e-visit.  Your e-visit answers were reviewed by a board certified advanced clinical practitioner to complete your personal  care plan. Depending upon the condition, your plan could have included both over the counter or prescription medications.  Please review your pharmacy choice. Make sure the pharmacy is open so you can pick up prescription now. If there is a problem, you may contact your provider through [employer] and have the prescription routed to another pharmacy.  Your safety is important to us . If you have drug allergies check your prescription carefully.   For the next 24 hours you can use MyChart to ask questions about today's visit, request a non-urgent call back, or ask for a work or school excuse. You will get an email in the next two days asking about your experience. I hope that your e-visit has been valuable and will speed your recovery.

## 2024-02-29 NOTE — Progress Notes (Signed)
 Specialty Pharmacy Ongoing Clinical Assessment Note  Linda Wilson is a 53 y.o. female who is being followed by the specialty pharmacy service for RxSp Other Inflammatory Conditions   Patient's specialty medication(s) reviewed today: Risankizumab -rzaa (Skyrizi )   Missed doses in the last 4 weeks: 0   Patient/Caregiver did not have any additional questions or concerns.   Therapeutic benefit summary: Patient is achieving benefit   Adverse events/side effects summary: No adverse events/side effects   Patient's therapy is appropriate to: Continue    Goals Addressed             This Visit's Progress    Maintain optimal adherence to therapy   On track    Patient is on track. Patient will maintain adherence.  Patient reports she has had an excellent response to therapy.         Follow up: 12 months  Malachi Screws Specialty Pharmacist

## 2024-02-29 NOTE — Progress Notes (Signed)
 I have spent 5 minutes in review of e-visit questionnaire, review and updating patient chart, medical decision making and response to patient.   Laure Kidney, PA-C

## 2024-03-10 ENCOUNTER — Telehealth: Admitting: Family Medicine

## 2024-03-10 DIAGNOSIS — R21 Rash and other nonspecific skin eruption: Secondary | ICD-10-CM

## 2024-03-10 MED ORDER — TRIAMCINOLONE ACETONIDE 0.1 % EX CREA: 1.0000 | TOPICAL_CREAM | Freq: Two times a day (BID) | CUTANEOUS | 0 refills | Status: AC

## 2024-03-10 NOTE — Progress Notes (Signed)
 E Visit for Rash  We are sorry that you are not feeling well. Here is how we plan to help!  I am prescribing triamcinolone  0.1 % cream -- apply to the affected area(s) in a thin layer, twice daily for up to 14 days. Do not apply to face, privates or armpit regions.    HOME CARE:  Take cool showers and avoid direct sunlight. Apply cool compress or wet dressings. Take a bath in an oatmeal bath.  Sprinkle content of one Aveeno packet under running faucet with comfortably warm water.  Bathe for 15-20 minutes, 1-2 times daily.  Pat dry with a towel. Do not rub the rash. Use hydrocortisone  cream. Take an antihistamine like Benadryl for widespread rashes that itch.  The adult dose of Benadryl is 25-50 mg by mouth 4 times daily. Caution:  This type of medication may cause sleepiness.  Do not drink alcohol, drive, or operate dangerous machinery while taking antihistamines.  Do not take these medications if you have prostate enlargement.  Read package instructions thoroughly on all medications that you take.  GET HELP RIGHT AWAY IF:  Symptoms don't go away after treatment. Severe itching that persists. If you rash spreads or swells. If you rash begins to smell. If it blisters and opens or develops a yellow-brown crust. You develop a fever. You have a sore throat. You become short of breath.  MAKE SURE YOU:  Understand these instructions. Will watch your condition. Will get help right away if you are not doing well or get worse.  Thank you for choosing an e-visit.  Your e-visit answers were reviewed by a board certified advanced clinical practitioner to complete your personal care plan. Depending upon the condition, your plan could have included both over the counter or prescription medications.  Please review your pharmacy choice. Make sure the pharmacy is open so you can pick up prescription now. If there is a problem, you may contact your provider through [employer] and have the  prescription routed to another pharmacy.  Your safety is important to us . If you have drug allergies check your prescription carefully.   For the next 24 hours you can use MyChart to ask questions about today's visit, request a non-urgent call back, or ask for a work or school excuse. You will get an email in the next two days asking about your experience. I hope that your e-visit has been valuable and will speed your recovery.

## 2024-03-19 ENCOUNTER — Other Ambulatory Visit (HOSPITAL_COMMUNITY): Payer: Self-pay

## 2024-04-10 ENCOUNTER — Other Ambulatory Visit (HOSPITAL_COMMUNITY): Payer: Self-pay

## 2024-04-10 ENCOUNTER — Other Ambulatory Visit: Payer: Self-pay

## 2024-04-11 ENCOUNTER — Other Ambulatory Visit (HOSPITAL_COMMUNITY): Payer: Self-pay

## 2024-04-11 MED ORDER — CLOBETASOL PROPIONATE 0.05 % EX CREA
1.0000 | TOPICAL_CREAM | Freq: Two times a day (BID) | CUTANEOUS | 0 refills | Status: DC
  Filled 2024-04-11: qty 45, 45d supply, fill #0

## 2024-04-19 ENCOUNTER — Other Ambulatory Visit (HOSPITAL_COMMUNITY): Payer: Self-pay

## 2024-04-23 ENCOUNTER — Other Ambulatory Visit: Payer: Self-pay

## 2024-04-23 ENCOUNTER — Other Ambulatory Visit (HOSPITAL_COMMUNITY): Payer: Self-pay

## 2024-04-23 ENCOUNTER — Telehealth (HOSPITAL_COMMUNITY): Payer: Self-pay

## 2024-04-23 NOTE — Progress Notes (Signed)
 PA pending

## 2024-04-23 NOTE — Progress Notes (Unsigned)
 Benefits Investigation Started  Rejection: PA required  Routed to: [place]

## 2024-04-23 NOTE — Telephone Encounter (Signed)
 Pharmacy Patient Advocate Encounter   Received notification from Patient Pharmacy that prior authorization for Skyrizi  is required/requested.   Insurance verification completed.   The patient is insured through [employer] .   Per test claim: PA required; PA submitted to above mentioned insurance via Latent Key/confirmation #/EOC [id number] Status is pending

## 2024-04-24 ENCOUNTER — Other Ambulatory Visit (HOSPITAL_COMMUNITY): Payer: Self-pay

## 2024-04-24 ENCOUNTER — Other Ambulatory Visit: Payer: Self-pay

## 2024-04-24 NOTE — Progress Notes (Signed)
 Specialty Pharmacy Refill Coordination Note  Spoke with Linda Wilson  Linda Wilson is a 53 y.o. female contacted today regarding refills of specialty medication(s) Risankizumab -rzaa (Skyrizi )  Doses on hand: 0  Injection date: 05/01/24   Patient requested: Delivery   Delivery date: 04/26/24   Verified address: [address] [facility] [place] [address]  Medication will be filled on 04/25/24.

## 2024-04-24 NOTE — Progress Notes (Signed)
 PA approved.

## 2024-04-24 NOTE — Telephone Encounter (Signed)
 Pharmacy Patient Advocate Encounter  Received notification from [facility] that Prior Authorization for Skyrizi  has been APPROVED from 04/23/24 to 04/22/25   PA #/Case ID/Reference #: [id number]

## 2024-04-25 ENCOUNTER — Telehealth: Admitting: Family Medicine

## 2024-04-25 DIAGNOSIS — M545 Low back pain, unspecified: Secondary | ICD-10-CM | POA: Diagnosis not present

## 2024-04-25 MED ORDER — NAPROXEN 500 MG PO TABS: 500.0000 mg | ORAL_TABLET | Freq: Two times a day (BID) | ORAL | 0 refills | Status: DC

## 2024-04-25 MED ORDER — CYCLOBENZAPRINE HCL 10 MG PO TABS: 10.0000 mg | ORAL_TABLET | Freq: Three times a day (TID) | ORAL | 0 refills | Status: AC | PRN

## 2024-04-25 NOTE — Progress Notes (Signed)
E-Visit for Back Pain   We are sorry that you are not feeling well.  Here is how we plan to help!  Based on what you have shared with me it looks like you mostly have acute back pain.  Acute back pain is defined as musculoskeletal pain that can resolve in 1-3 weeks with conservative treatment.  I have prescribed Naprosyn 500 mg take one by mouth twice a day non-steroid anti-inflammatory (NSAID) as well as Flexeril 10 mg every eight hours as needed which is a muscle relaxer  Some patients experience stomach irritation or in increased heartburn with anti-inflammatory drugs.  Please keep in mind that muscle relaxer's can cause fatigue and should not be taken while at work or driving.  Back pain is very common.  The pain often gets better over time.  The cause of back pain is usually not dangerous.  Most people can learn to manage their back pain on their own.  Home Care Stay active.  Start with short walks on flat ground if you can.  Try to walk farther each day. Do not sit, drive or stand in one place for more than 30 minutes.  Do not stay in bed. Do not avoid exercise or work.  Activity can help your back heal faster. Be careful when you bend or lift an object.  Bend at your knees, keep the object close to you, and do not twist. Sleep on a firm mattress.  Lie on your side, and bend your knees.  If you lie on your back, put a pillow under your knees. Only take medicines as told by your doctor. Put ice on the injured area. Put ice in a plastic bag Place a towel between your skin and the bag Leave the ice on for 15-20 minutes, 3-4 times a day for the first 2-3 days. 210 After that, you can switch between ice and heat packs. Ask your doctor about back exercises or massage. Avoid feeling anxious or stressed.  Find good ways to deal with stress, such as exercise.  Get Help Right Way If: Your pain does not go away with rest or medicine. Your pain does not go away in 1 week. You have new  problems. You do not feel well. The pain spreads into your legs. You cannot control when you poop (bowel movement) or pee (urinate) You feel sick to your stomach (nauseous) or throw up (vomit) You have belly (abdominal) pain. You feel like you may pass out (faint). If you develop a fever.  Make Sure you: Understand these instructions. Will watch your condition Will get help right away if you are not doing well or get worse.  Your e-visit answers were reviewed by a board certified advanced clinical practitioner to complete your personal care plan.  Depending on the condition, your plan could have included both over the counter or prescription medications.  If there is a problem please reply  once you have received a response from your provider.  Your safety is important to [place].  If you have drug allergies check your prescription carefully.    You can use MyChart to ask questions about today's visit, request a non-urgent call back, or ask for a work or school excuse for 24 hours related to this e-Visit. If it has been greater than 24 hours you will need to follow up with your provider, or enter a new e-Visit to address those concerns.  You will get an e-mail in the next two days asking about  your experience.  I hope that your e-visit has been valuable and will speed your recovery. Thank you for using e-visits.  I provided 5 minutes of non face-to-face time during this encounter for chart review, medication and order placement, as well as and documentation.

## 2024-05-30 ENCOUNTER — Telehealth: Admitting: Physician Assistant

## 2024-05-30 DIAGNOSIS — R062 Wheezing: Secondary | ICD-10-CM

## 2024-05-30 DIAGNOSIS — R0602 Shortness of breath: Secondary | ICD-10-CM

## 2024-05-30 NOTE — Progress Notes (Signed)
   Thank you for the details you included in the comment boxes. Those details are very helpful in determining the best course of treatment for you and help us  to provide the best care. Because of having difficulty breathing, we recommend that you schedule a Virtual Urgent Care video visit in order for the provider to better assess what is going on.  The provider will be able to give you a more accurate diagnosis and treatment plan if we can more freely discuss your symptoms and with the addition of a virtual examination.   If you change your visit to a video visit, we will bill your insurance (similar to an office visit) and you will not be charged for this e-Visit. You will be able to stay at home and speak with the first available [facility] advanced practice provider. The link to do a video visit is in the drop down Menu tab of your Welcome screen in MyChart.        I have spent 5 minutes in review of e-visit questionnaire, review and updating patient chart, medical decision making and response to patient.   Delon CHRISTELLA Dickinson, PA-C

## 2024-06-07 ENCOUNTER — Other Ambulatory Visit (HOSPITAL_COMMUNITY): Payer: Self-pay

## 2024-06-10 ENCOUNTER — Ambulatory Visit: Admitting: Family

## 2024-06-10 ENCOUNTER — Encounter: Payer: Self-pay | Admitting: Family

## 2024-06-10 VITALS — BP 132/75 | HR 88 | Temp 97.9°F | Ht 69.0 in | Wt 296.4 lb

## 2024-06-10 DIAGNOSIS — B079 Viral wart, unspecified: Secondary | ICD-10-CM

## 2024-06-10 DIAGNOSIS — L301 Dyshidrosis [pompholyx]: Secondary | ICD-10-CM | POA: Diagnosis not present

## 2024-06-10 DIAGNOSIS — E66813 Obesity, class 3: Secondary | ICD-10-CM

## 2024-06-10 DIAGNOSIS — I10 Essential (primary) hypertension: Secondary | ICD-10-CM

## 2024-06-10 DIAGNOSIS — D509 Iron deficiency anemia, unspecified: Secondary | ICD-10-CM | POA: Diagnosis not present

## 2024-06-10 DIAGNOSIS — E782 Mixed hyperlipidemia: Secondary | ICD-10-CM | POA: Diagnosis not present

## 2024-06-10 NOTE — Progress Notes (Signed)
 Subjective:    Patient ID: Linda Wilson, female    DOB: 1971-08-26, 53 y.o.   MRN: [id number]  Chief Complaint  Patient presents with   4 MONTH FOLLOW UP   Pt presents to the office today for chronic follow up.   She is followed by Dermatologists every 3 months for eczema and psoriasis. She is currently taking Skyrizi .  She is complaining of a wart on palm of hand that she has noticed several months. She has used OTC with no success.  Anemia Presents for follow-up visit. There has been no bruising/bleeding easily, fever, malaise/fatigue or weight loss.  Hypertension This is a chronic problem. The current episode started more than 1 year ago. The problem has been resolved since onset. The problem is controlled. Associated symptoms include headaches. Pertinent negatives include no malaise/fatigue, peripheral edema or shortness of breath. Risk factors for coronary artery disease include obesity. The current treatment provides moderate improvement.  Hyperlipidemia This is a chronic problem. The current episode started more than 1 year ago. The problem is uncontrolled. Recent lipid tests were reviewed and are high. Pertinent negatives include no shortness of breath. Current antihyperlipidemic treatment includes statins. The current treatment provides moderate improvement of lipids. Risk factors for coronary artery disease include dyslipidemia, hypertension, a sedentary lifestyle and post-menopausal.  Rash This is a new problem. The current episode started 1 to 4 weeks ago. The problem is unchanged. Location: hand. Pertinent negatives include no fever or shortness of breath.      Review of Systems  Constitutional:  Negative for fever, malaise/fatigue and weight loss.  Respiratory:  Negative for shortness of breath.   Skin:  Positive for rash.  Neurological:  Positive for headaches.  Hematological:  Does not bruise/bleed easily.  All other systems reviewed and are negative.   Social  History   Socioeconomic History   Marital status: Single    Spouse name: Not on file   Number of children: Not on file   Years of education: Not on file   Highest education level: 12th grade  Occupational History   Not on file  Tobacco Use   Smoking status: Never   Smokeless tobacco: Never  Vaping Use   Vaping status: Never Used  Substance and Sexual Activity   Alcohol use: No   Drug use: No   Sexual activity: Not Currently  Other Topics Concern   Not on file  Social History Narrative   Not on file   Social Drivers of Health   Financial Resource Strain: Low Risk  (06/07/2024)   Overall Financial Resource Strain (CARDIA)    Difficulty of Paying Living Expenses: Not hard at all  Food Insecurity: No Food Insecurity (06/07/2024)   Hunger Vital Sign    Worried About Running Out of Food in the Last Year: Never true    Ran Out of Food in the Last Year: Never true  Transportation Needs: No Transportation Needs (06/07/2024)   PRAPARE - Administrator, Civil Service (Medical): No    Lack of Transportation (Non-Medical): No  Physical Activity: Insufficiently Active (06/07/2024)   Exercise Vital Sign    Days of Exercise per Week: 1 day    Minutes of Exercise per Session: 10 min  Stress: No Stress Concern Present (06/07/2024)   [employer] of Occupational Health - Occupational Stress Questionnaire    Feeling of Stress: Not at all  Social Connections: Socially Isolated (06/07/2024)   Social Connection and Isolation Panel  Frequency of Communication with Friends and Family: Three times a week    Frequency of Social Gatherings with Friends and Family: Once a week    Attends Religious Services: Patient declined    Database administrator or Organizations: No    Attends Engineer, structural: Not on file    Marital Status: Never married   Family History  Problem Relation Age of Onset   Healthy Mother    Heart disease Father        MI   Diabetes Father     Stroke Father    Colon polyps Neg Hx    Colon cancer Neg Hx    Esophageal cancer Neg Hx    Rectal cancer Neg Hx    Stomach cancer Neg Hx         Objective:   Physical Exam Vitals reviewed.  Constitutional:      General: She is not in acute distress.    Appearance: She is well-developed. She is obese.  HENT:     Head: Normocephalic and atraumatic.     Right Ear: Tympanic membrane normal.     Left Ear: Tympanic membrane normal.  Eyes:     Pupils: Pupils are equal, round, and reactive to light.  Neck:     Thyroid : No thyromegaly.  Cardiovascular:     Rate and Rhythm: Normal rate and regular rhythm.     Heart sounds: Normal heart sounds. No murmur heard. Pulmonary:     Effort: Pulmonary effort is normal. No respiratory distress.     Breath sounds: Normal breath sounds. No wheezing.  Abdominal:     General: Bowel sounds are normal. There is no distension.     Palpations: Abdomen is soft.     Tenderness: There is no abdominal tenderness.  Musculoskeletal:        General: No tenderness. Normal range of motion.     Cervical back: Normal range of motion and neck supple.  Skin:    General: Skin is warm and dry.  Neurological:     Mental Status: She is alert and oriented to person, place, and time.     Cranial Nerves: No cranial nerve deficit.     Deep Tendon Reflexes: Reflexes are normal and symmetric.  Psychiatric:        Behavior: Behavior normal.        Thought Content: Thought content normal.        Judgment: Judgment normal.    Wart on palm of right hand. Cryotherapy used. Pt tolerated well.    BP 132/75   Pulse 88   Temp 97.9 F (36.6 C)   Ht 5' 9 (1.753 m)   Wt 296 lb 6.4 oz (134.4 kg)   LMP 11/09/2014   SpO2 99%   BMI 43.77 kg/m      Assessment & Plan:  Modest L Poynor comes in today with chief complaint of 4 MONTH FOLLOW UP   Diagnosis and orders addressed:  1. Mixed hyperlipidemia (Primary) - CMP14+EGFR  2. Microcytic anemia - Anemia Profile  B - CMP14+EGFR  3. Obesity, Class III, BMI 40-49.9 (morbid obesity) - CMP14+EGFR  4. Primary hypertension - CMP14+EGFR  5. Viral warts, unspecified type - Cryotherapy used  If blister appears   6. Dyshidrotic eczema Continue steroid cream   Labs pending Continue current medications  Keep follow up with specialists  Health Maintenance reviewed Diet and exercise encouraged  Return in about 6 months (around 12/08/2024), or if symptoms worsen or  fail to improve.    Bari Learn, FNP

## 2024-06-10 NOTE — Patient Instructions (Signed)
Iron Deficiency Anemia, Adult  Iron deficiency anemia is a condition in which the concentration of red blood cells or hemoglobin in the blood is below normal because of too little iron. Hemoglobin is a substance in red blood cells that carries oxygen to the body's tissues. When the concentration of red blood cells or hemoglobin is too low, not enough oxygen reaches these tissues. Iron deficiency anemia is usually long-lasting, and it develops over time. It may or may not cause symptoms. It is a common type of anemia. What are the causes? This condition may be caused by: Not enough iron in the diet. Abnormal absorption in the gut. Blood loss. What increases the risk? You are more likely to develop this condition if you get menstrual periods (menstruate) or are pregnant. What are the signs or symptoms? Symptoms of this condition may include: Pale skin, lips, and nail beds. Weakness, dizziness, and getting tired easily. Shortness of breath when moving or exercising. Cold hands or feet. Mild anemia may not cause any symptoms. How is this diagnosed? This condition is diagnosed based on: Your medical history. A physical exam. Blood tests. How is this treated? This condition is treated by correcting the cause of your iron deficiency. Treatment may involve: Adding iron-rich foods to your diet. Taking iron supplements. If you are pregnant or breastfeeding, you may need to take extra iron because your normal diet usually does not provide the amount of iron that you need. Increasing vitamin C intake. Vitamin C helps your body absorb iron. Your health care provider may recommend that you take iron supplements along with a glass of orange juice or a vitamin C supplement. Medicines to make heavy menstrual flow lighter. Surgery or additional testing procedures to determine the cause of your anemia. You may need repeat blood tests to determine whether treatment is working. If the treatment does not  seem to be working, you may need more tests. Follow these instructions at home: Medicines Take over-the-counter and prescription medicines only as told by your health care provider. This includes iron supplements and vitamins. This is important because too much iron can be harmful. For the best iron absorption, you should take iron supplements when your stomach is empty. If you cannot tolerate them on an empty stomach, you may need to take them with food. Do not drink milk or take antacids at the same time as your iron supplements. Milk and antacids may interfere with how your body absorbs iron. Iron supplements may turn stool (feces) a darker color and it may appear black. If you cannot tolerate taking iron supplements by mouth, talk with your health care provider about taking them through an IV or through an injection into a muscle. Eating and drinking Talk with your health care provider before changing your diet. Your provider may recommend that you eat foods that contain a lot of iron, such as: Liver. Low-fat (lean) beef. Breads and cereals that have iron added to them (are fortified). Eggs. Dried fruit. Dark green, leafy vegetables. To help your body use the iron from iron-rich foods, eat those foods at the same time as fresh fruits and vegetables that are high in vitamin C. Foods that are high in vitamin C include: Oranges. Peppers. Tomatoes. Mangoes. Managing constipation If you are taking an iron supplement, it may cause constipation. To prevent or treat constipation, you may need to: Drink enough fluid to keep your urine pale yellow. Take over-the-counter or prescription medicines. Eat foods that are high in fiber, such   as beans, whole grains, and fresh fruits and vegetables. Limit foods that are high in fat and processed sugars, such as fried or sweet foods. General instructions Return to your normal activities as told by your health care provider. Ask your health care provider  what activities are safe for you. Keep all follow-up visits. Contact a health care provider if: You feel nauseous or you vomit. You feel weak. You become light-headed when getting up from a sitting or lying down position. You have unexplained sweating. You develop symptoms of constipation. You have a heaviness in your chest. You have trouble breathing with physical activity. Get help right away if: You faint. If this happens, do not drive yourself to the hospital. You have an irregular or rapid heartbeat. Summary Iron deficiency anemia is a condition in which the concentration of red blood cells or hemoglobin in the blood is below normal because of too little iron. This condition is treated by correcting the cause of your iron deficiency. Take over-the-counter and prescription medicines only as told by your health care provider. This includes iron supplements and vitamins. To help your body use the iron from iron-rich foods, eat those foods at the same time as fresh fruits and vegetables that are high in vitamin C. Seek medical help if you have signs or symptoms of worsening anemia. This information is not intended to replace advice given to you by your health care provider. Make sure you discuss any questions you have with your health care provider. Document Revised: 10/20/2021 Document Reviewed: 10/20/2021 Elsevier Patient Education  2024 [employer] Inc.  

## 2024-06-11 ENCOUNTER — Ambulatory Visit: Payer: Self-pay | Admitting: Family

## 2024-06-11 LAB — ANEMIA PROFILE B
Basophils Absolute: 0.1 x10E3/uL (ref 0.0–0.2)
Basos: 1 %
EOS (ABSOLUTE): 0.4 x10E3/uL (ref 0.0–0.4)
Eos: 4 %
Ferritin: 69 ng/mL (ref 15–150)
Folate: 5 ng/mL (ref >3.0)
Hematocrit: 44.8 % (ref 34.0–46.6)
Hemoglobin: 14.4 g/dL (ref 11.1–15.9)
Immature Grans (Abs): 0 x10E3/uL (ref 0.0–0.1)
Immature Granulocytes: 0 %
Iron Saturation: 11 % — ABNORMAL LOW (ref 15–55)
Iron: 41 ug/dL (ref 27–159)
Lymphocytes Absolute: 3.2 x10E3/uL — ABNORMAL HIGH (ref 0.7–3.1)
Lymphs: 32 %
MCH: 26.9 pg (ref 26.6–33.0)
MCHC: 32.1 g/dL (ref 31.5–35.7)
MCV: 84 fL (ref 79–97)
Monocytes Absolute: 0.7 x10E3/uL (ref 0.1–0.9)
Monocytes: 7 %
Neutrophils Absolute: 5.5 x10E3/uL (ref 1.4–7.0)
Neutrophils: 56 %
Platelets: 264 x10E3/uL (ref 150–450)
RBC: 5.35 x10E6/uL — ABNORMAL HIGH (ref 3.77–5.28)
RDW: 15.3 % (ref 11.7–15.4)
Retic Ct Pct: 2.1 % (ref 0.6–2.6)
Total Iron Binding Capacity: 366 ug/dL (ref 250–450)
UIBC: 325 ug/dL (ref 131–425)
Vitamin B-12: 784 pg/mL (ref 232–1245)
WBC: 10 x10E3/uL (ref 3.4–10.8)

## 2024-06-11 LAB — CMP14+EGFR
ALT: 34 IU/L — ABNORMAL HIGH (ref 0–32)
AST: 40 IU/L (ref 0–40)
Albumin: 4.5 g/dL (ref 3.8–4.9)
Alkaline Phosphatase: 116 IU/L (ref 49–135)
BUN/Creatinine Ratio: 9 (ref 9–23)
BUN: 6 mg/dL (ref 6–24)
Bilirubin Total: 1 mg/dL (ref 0.0–1.2)
CO2: 24 mmol/L (ref 20–29)
Calcium: 9.7 mg/dL (ref 8.7–10.2)
Chloride: 98 mmol/L (ref 96–106)
Creatinine, Ser: 0.7 mg/dL (ref 0.57–1.00)
Globulin, Total: 3.4 g/dL (ref 1.5–4.5)
Glucose: 90 mg/dL (ref 70–99)
Potassium: 4.2 mmol/L (ref 3.5–5.2)
Sodium: 140 mmol/L (ref 134–144)
Total Protein: 7.9 g/dL (ref 6.0–8.5)
eGFR: 103 mL/min/1.73 (ref >59)

## 2024-06-13 ENCOUNTER — Other Ambulatory Visit: Payer: Self-pay

## 2024-06-13 ENCOUNTER — Other Ambulatory Visit: Payer: Self-pay | Admitting: Family

## 2024-06-13 ENCOUNTER — Other Ambulatory Visit (HOSPITAL_COMMUNITY): Payer: Self-pay

## 2024-06-13 DIAGNOSIS — D509 Iron deficiency anemia, unspecified: Secondary | ICD-10-CM

## 2024-06-13 MED ORDER — PHENTERMINE HCL 37.5 MG PO TABS
37.5000 mg | ORAL_TABLET | Freq: Every day | ORAL | 2 refills | Status: AC
  Filled 2024-06-13: qty 30, 30d supply, fill #0
  Filled 2024-07-09 – 2024-07-11 (×2): qty 30, 30d supply, fill #1
  Filled 2024-08-05: qty 30, 30d supply, fill #2
  Filled 2024-08-05 – 2024-08-08 (×2): qty 30, 30d supply, fill #0
  Filled 2024-08-09: qty 30, 30d supply, fill #2
  Filled ????-??-??: fill #0

## 2024-06-13 MED ORDER — FERROUS SULFATE 325 (65 FE) MG PO TBEC
325.0000 mg | DELAYED_RELEASE_TABLET | Freq: Two times a day (BID) | ORAL | 5 refills | Status: AC
  Filled 2024-06-13 – 2024-08-05 (×3): qty 60, 30d supply, fill #0
  Filled 2024-08-05: qty 60, 30d supply, fill #1
  Filled 2024-08-06 – 2024-09-09 (×3): qty 60, 30d supply, fill #0
  Filled 2024-10-11 – 2024-10-27 (×2): qty 60, 30d supply, fill #1

## 2024-06-14 ENCOUNTER — Other Ambulatory Visit (HOSPITAL_COMMUNITY): Payer: Self-pay

## 2024-06-25 ENCOUNTER — Other Ambulatory Visit (HOSPITAL_COMMUNITY): Payer: Self-pay

## 2024-07-03 ENCOUNTER — Telehealth: Admitting: Physician Assistant

## 2024-07-03 DIAGNOSIS — J029 Acute pharyngitis, unspecified: Secondary | ICD-10-CM

## 2024-07-03 MED ORDER — LIDOCAINE VISCOUS HCL 2 % MT SOLN: 5.0000 mL | OROMUCOSAL | 0 refills | Status: AC | PRN

## 2024-07-03 MED ORDER — NAPROXEN 500 MG PO TABS: 500.0000 mg | ORAL_TABLET | Freq: Two times a day (BID) | ORAL | 0 refills | Status: DC

## 2024-07-03 NOTE — Progress Notes (Signed)
 E-Visit for Corona Virus Screening    Your current symptoms could be consistent with the coronavirus.  You can get a Covid test to take at home from any pharmacy. If desired, many health care providers can now test patients at their office but not all are.   has multiple testing sites. For information on our COVID testing locations and hours go to [email or web address]  Please quarantine yourself while awaiting your test results.   COVID-19 is a respiratory illness with symptoms that are similar to the flu. Symptoms are typically mild to moderate, but there have been cases of severe illness and death due to the virus. The following symptoms may appear 2-14 days after exposure: Fever Cough Shortness of breath or difficulty breathing Chills Repeated shaking with chills Muscle pain Headache Sore throat New loss of taste or smell Fatigue Congestion or runny nose Nausea or vomiting Diarrhea  It is vitally important that if you feel that you have an infection such as this virus or any other virus that you stay home and away from places where you may spread it to others.  You should self-quarantine for 14 days if you have symptoms that could potentially be coronavirus or have been in close contact a with a person diagnosed with COVID-19 within the last 2 weeks. You should avoid contact with people age 53 and older.   You should wear a mask or cloth face covering over your nose and mouth if you must be around other people or animals, including pets (even at home). Try to stay at least 6 feet away from other people. This will protect the people around you.  You can use medication such as I have prescribed an anti-inflammatory - Naprosyn  500 mg. Take twice daily as needed for fever or body aches for 2 weeks and Viscous Lidocaine  2% Swallow 5mL every 6 hours as needed for throat pain.   You may also take acetaminophen  (Tylenol ) as needed for fever.   Reduce  your risk of any infection by using the same precautions used for avoiding the common cold or flu:  Wash your hands often with soap and warm water for at least 20 seconds.  If soap and water are not readily available, use an alcohol-based hand sanitizer with at least 60% alcohol.  If coughing or sneezing, cover your mouth and nose by coughing or sneezing into the elbow areas of your shirt or coat, into a tissue or into your sleeve (not your hands). Avoid shaking hands with others and consider head nods or verbal greetings only. Avoid touching your eyes, nose, or mouth with unwashed hands.  Avoid close contact with people who are sick. Avoid places or events with large numbers of people in one location, like concerts or sporting events. Carefully consider travel plans you have or are making. If you are planning any travel outside or inside the [place] , visit the CDC's Travelers' Health webpage for the latest health notices. If you have some symptoms but not all symptoms, continue to monitor at home and seek medical attention if your symptoms worsen. If you are having a medical emergency, call 911.  HOME CARE Only take medications as instructed by your medical team. Drink plenty of fluids and get plenty of rest. A steam or ultrasonic humidifier can help if you have congestion.   GET HELP RIGHT AWAY IF YOU HAVE EMERGENCY WARNING SIGNS** FOR COVID-19. If you or someone is showing any of these signs seek emergency medical care immediately. Call 911  or proceed to your closest emergency facility if: You develop worsening high fever. Trouble breathing Bluish lips or face Persistent pain or pressure in the chest New confusion Inability to wake or stay awake You cough up blood. Your symptoms become more severe  **This list is not all possible symptoms. Contact your medical provider for any symptoms that are sever or concerning to you.   MAKE SURE YOU  Understand these instructions. Will watch your  condition. Will get help right away if you are not doing well or get worse.  Your e-visit answers were reviewed by a board certified advanced clinical practitioner to complete your personal care plan.  Depending on the condition, your plan could have included both over the counter or prescription medications.  If there is a problem please reply once you have received a response from your provider.  Your safety is important to us .  If you have drug allergies check your prescription carefully.    You can use MyChart to ask questions about today's visit, request a non-urgent call back, or ask for a work or school excuse for 24 hours related to this e-Visit. If it has been greater than 24 hours you will need to follow up with your provider, or enter a new e-Visit to address those concerns. You will get an e-mail in the next two days asking about your experience.  I hope that your e-visit has been valuable and will speed your recovery. Thank you for using e-visits.   I have spent 5 minutes in review of e-visit questionnaire, review and updating patient chart, medical decision making and response to patient.   Delon CHRISTELLA Dickinson, PA-C

## 2024-07-09 ENCOUNTER — Other Ambulatory Visit (HOSPITAL_COMMUNITY): Payer: Self-pay

## 2024-07-09 ENCOUNTER — Other Ambulatory Visit: Payer: Self-pay

## 2024-07-09 MED ORDER — CLOBETASOL PROPIONATE 0.05 % EX CREA
1.0000 | TOPICAL_CREAM | Freq: Two times a day (BID) | CUTANEOUS | 0 refills | Status: DC
  Filled 2024-07-09: qty 45, 45d supply, fill #0

## 2024-07-10 ENCOUNTER — Other Ambulatory Visit (HOSPITAL_COMMUNITY): Payer: Self-pay

## 2024-07-10 ENCOUNTER — Other Ambulatory Visit: Payer: Self-pay

## 2024-07-11 ENCOUNTER — Other Ambulatory Visit: Payer: Self-pay

## 2024-07-11 ENCOUNTER — Other Ambulatory Visit (HOSPITAL_COMMUNITY): Payer: Self-pay

## 2024-07-12 ENCOUNTER — Ambulatory Visit (HOSPITAL_COMMUNITY)
Admission: RE | Admit: 2024-07-12 | Discharge: 2024-07-12 | Disposition: A | Discharge status: Home / Self Care | Source: Ambulatory Visit | Attending: Internal Medicine | Admitting: Internal Medicine

## 2024-07-12 ENCOUNTER — Encounter (HOSPITAL_COMMUNITY): Payer: Self-pay

## 2024-07-12 VITALS — BP 147/82 | HR 88 | Temp 97.4°F | Resp 18

## 2024-07-12 DIAGNOSIS — L03317 Cellulitis of buttock: Secondary | ICD-10-CM

## 2024-07-12 DIAGNOSIS — L0231 Cutaneous abscess of buttock: Secondary | ICD-10-CM | POA: Diagnosis not present

## 2024-07-12 MED ORDER — LIDOCAINE-EPINEPHRINE 1 %-1:100000 IJ SOLN
INTRAMUSCULAR | Status: AC
  Filled 2024-07-12: qty 1

## 2024-07-12 NOTE — ED Triage Notes (Signed)
 Pt c/o abscess in crease of lt buttocks x3 days. States hot to touch, tender, and red. Denies drainage. States using hot compresses last night.

## 2024-07-12 NOTE — Discharge Instructions (Addendum)
 Incision and drainage done of left buttocks abscess.  There is still a significant amount of induration present and will start antibiotics by mouth.  We recommend the following: Doxycycline  100 mg twice daily for 7 days. Take this with food.  May alternate Tylenol  and ibuprofen for pain May remove dressing tonight and wash the area with soap and water.  Reapply a clean, dry dressing and change at least once daily.  May change more frequently if needed.  Keep a dressing in place until the area has completely healed which will likely take 4 to 5 days. Monitor for signs of worsening infection including increasing pain, redness, purulent drainage, fevers.  If this occurs return to urgent care

## 2024-07-12 NOTE — ED Provider Notes (Signed)
 [facility]    CSN: [id number] Arrival date & time: 07/12/24  1553      History   Chief Complaint Chief Complaint  Patient presents with   Abscess    I have an abscess on left lower butt check - Entered by patient   APPT- abscess    HPI Linda Wilson is a 53 y.o. female.   53 year old female presents urgent care with complaints of an abscess on the left buttocks.  She reports that this has been present for about 3 days.  It is very tender and warm to the touch.  It is difficult to sit due to the location.  She has had an abscess on her buttocks in the past but not in the same location.  She denies any fevers or chills.   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   Tear of lateral meniscus of knee 02/20/2022    Past Surgical History:  Procedure Laterality Date   BIOPSY  11/02/2022   Procedure: BIOPSY;  Surgeon: San Sandor GAILS, DO;  Location: [facility];  Service: Gastroenterology;;   CESAREAN SECTION     CHOLECYSTECTOMY N/A 11/17/2022   Procedure: LAPAROSCOPIC CHOLECYSTECTOMY WITH INTRAOPERATIVE CHOLANGIOGRAM;  Surgeon: Eletha Boas, MD;  Location: [facility];  Service: General;  Laterality: N/A;   ESOPHAGOGASTRODUODENOSCOPY (EGD) WITH PROPOFOL  N/A 11/02/2022   Procedure: ESOPHAGOGASTRODUODENOSCOPY (EGD) WITH PROPOFOL ;  Surgeon: San Sandor GAILS, DO;  Location: [facility];  Service: Gastroenterology;  Laterality: N/A;   INCISION AND  DRAINAGE ABSCESS  02/15/2007   pubic abscess - I&D Dr Curvin   LIVER BIOPSY N/A 11/17/2022   Procedure: LIVER BIOPSY;  Surgeon: Eletha Boas, MD;  Location: [facility];  Service: General;  Laterality: N/A;   TUBAL LIGATION  09/16/2005   Boas Deaner, MD    OB History   No obstetric history on file.      Home Medications    Prior to Admission medications   Medication Sig Start Date End Date Taking? Authorizing Provider  doxycycline  (VIBRAMYCIN ) 100 MG capsule Take 1 capsule (100 mg total) by mouth 2 (two) times daily for 7 days. 07/12/24 07/19/24 Yes Ruthellen Tippy A, PA-C  atorvastatin  (LIPITOR) 20 MG tablet Take 1 tablet (20 mg total) by mouth daily. 01/16/24   Lavell Lye A, FNP  calcipotriene  (DOVONOX) 0.005 % cream Apply 1 Application topically 2 (two) times daily. 05/02/23     clobetasol  cream (TEMOVATE ) 0.05 % Apply 1 Application topically 2 (two) times daily on weekends. 07/09/24     cyclobenzaprine  (FLEXERIL ) 10 MG tablet Take 1 tablet (10 mg total) by mouth 3 (three) times daily as needed for muscle spasms. 04/25/24   Moishe Chiquita HERO, NP  ferrous sulfate  325 (65 FE) MG EC tablet Take 1 tablet (325 mg total) by mouth 2 (two) times daily. 06/13/24 06/13/25  Lavell Lye A, FNP  glucosamine-chondroitin 500-400 MG tablet Take 1 tablet by mouth daily.  [provider]  hydrocortisone  1 % ointment Apply 1 Application topically 2 (two) times daily. 02/29/24   Rolan Berthold, PA-C  lidocaine  (XYLOCAINE ) 2 % solution Use as directed 5 mLs in the mouth or throat as needed (sore throat). 07/03/24   Vivienne Delon HERO, PA-C  lisinopril -hydrochlorothiazide  (ZESTORETIC ) 20-12.5 MG tablet Take 1 tablet by mouth daily. 01/15/24   Lavell Bari LABOR, FNP  loratadine (CLARITIN) 10 MG tablet Take 10 mg by mouth daily.    [provider]  naproxen  (NAPROSYN ) 500 MG tablet Take 1 tablet (500 mg total) by mouth 2 (two) times daily with a meal. 07/03/24   Burnette, Delon HERO, PA-C   ondansetron  (ZOFRAN ) 4 MG tablet Take 1 tablet (4 mg total) by mouth every 8 (eight) hours as needed for nausea or vomiting. Patient taking differently: Take 4 mg by mouth daily as needed for nausea or vomiting. 10/31/22   Lavell Bari LABOR, FNP  phentermine  (ADIPEX-P ) 37.5 MG tablet Take 1 tablet (37.5 mg total) by mouth daily before breakfast. 06/13/24   Lavell Bari LABOR, FNP  risankizumab -rzaa (SKYRIZI ) 150 MG/ML SOSY prefilled syringe Inject 1 syringe subcutaneously every three months 08/31/23   Jegede, Olugbemiga E, MD  tamsulosin  (FLOMAX ) 0.4 MG CAPS capsule Take 1 capsule (0.4 mg total) by mouth daily. 02/23/24   Lavell Bari LABOR, FNP  triamcinolone  cream (KENALOG ) 0.1 % Apply 1 Application topically 2 (two) times daily. 03/10/24   Almeda Loa ORN, FNP    Family History Family History  Problem Relation Age of Onset   Healthy Mother    Heart disease Father        MI   Diabetes Father    Stroke Father    Colon polyps Neg Hx    Colon cancer Neg Hx    Esophageal cancer Neg Hx    Rectal cancer Neg Hx    Stomach cancer Neg Hx     Social History Social History   Tobacco Use   Smoking status: Never   Smokeless tobacco: Never  Vaping Use   Vaping status: Never Used  Substance Use Topics   Alcohol use: No   Drug use: No     Allergies   Deucravacitinib   Review of Systems Review of Systems  Constitutional:  Negative for chills and fever.  HENT:  Negative for ear pain and sore throat.   Eyes:  Negative for pain and visual disturbance.  Respiratory:  Negative for cough and shortness of breath.   Cardiovascular:  Negative for chest pain and palpitations.  Gastrointestinal:  Negative for abdominal pain and vomiting.  Genitourinary:  Negative for dysuria and hematuria.  Musculoskeletal:  Negative for arthralgias and back pain.  Skin:  Positive for color change. Negative for rash.  Neurological:  Negative for seizures and syncope.  All other systems reviewed and are  negative.    Physical Exam Triage Vital Signs ED Triage Vitals  Encounter Vitals Group     BP 07/12/24 1608 (!) 147/82     Girls Systolic BP Percentile --      Girls Diastolic BP Percentile --      Boys Systolic BP Percentile --      Boys Diastolic BP Percentile --      Pulse Rate 07/12/24 1608 88     Resp 07/12/24 1608 18     Temp 07/12/24 1608 (!) 97.4 F (36.3 C)     Temp Source 07/12/24 1608 Oral     SpO2 07/12/24 1608 97 %  Weight --      Height --      Head Circumference --      Peak Flow --      Pain Score 07/12/24 1606 10     Pain Loc --      Pain Education --      Exclude from Growth Chart --    No data found.  Updated Vital Signs BP (!) 147/82 (BP Location: Left Arm)   Pulse 88   Temp (!) 97.4 F (36.3 C) (Oral)   Resp 18   LMP 11/09/2014   SpO2 97%   Visual Acuity Right Eye Distance:   Left Eye Distance:   Bilateral Distance:    Right Eye Near:   Left Eye Near:    Bilateral Near:     Physical Exam Vitals and nursing note reviewed.  Constitutional:      General: She is not in acute distress.    Appearance: She is well-developed.  HENT:     Head: Normocephalic and atraumatic.  Eyes:     Conjunctiva/sclera: Conjunctivae normal.  Cardiovascular:     Rate and Rhythm: Normal rate and regular rhythm.  Pulmonary:     Effort: Pulmonary effort is normal. No respiratory distress.  Musculoskeletal:        General: No swelling.     Cervical back: Neck supple.  Skin:    General: Skin is warm and dry.     Capillary Refill: Capillary refill takes less than 2 seconds.      Neurological:     Mental Status: She is alert.  Psychiatric:        Mood and Affect: Mood normal.      UC Treatments / Results  Labs (all labs ordered are listed, but only abnormal results are displayed) Labs Reviewed - No data to display  EKG   Radiology No results found.  Procedures Incision and Drainage  Date/Time: 07/12/2024 4:36 PM  Performed by: Teresa Almarie LABOR, PA-C Authorized by: Teresa Almarie LABOR, PA-C   Consent:    Consent obtained:  Verbal   Consent given by:  Patient   Risks discussed:  Bleeding, incomplete drainage, pain and damage to other organs   Alternatives discussed:  No treatment Universal protocol:    Procedure explained and questions answered to patient or proxy's satisfaction: yes     Relevant documents present and verified: yes     Site/side marked: yes     Immediately prior to procedure, a time out was called: yes     Patient identity confirmed:  Verbally with patient Location:    Type:  Abscess   Size:  4 cm   Location:  Trunk   Trunk location: Left buttocks. Pre-procedure details:    Skin preparation:  Betadine Anesthesia:    Anesthesia method:  Local infiltration   Local anesthetic:  Lidocaine  1% WITH epi Procedure type:    Complexity:  Simple Procedure details:    Incision types:  Single straight   Incision depth:  Subcutaneous   Wound management:  Probed and deloculated, irrigated with saline and extensive cleaning   Drainage:  Purulent   Drainage amount:  Scant   Wound treatment:  Wound left open   Packing materials:  None Post-procedure details:    Procedure completion:  Tolerated well, no immediate complications  (including critical care time)  Medications Ordered in UC Medications - No data to display  Initial Impression / Assessment and Plan / UC Course  I have reviewed  the triage vital signs and the nursing notes.  Pertinent labs & imaging results that were available during my care of the patient were reviewed by me and considered in my medical decision making (see chart for details).     Cellulitis and abscess of buttock  Incision and drainage done of left buttocks abscess.  There is still a significant amount of induration present and will start antibiotics by mouth.  We recommend the following: Doxycycline  100 mg twice daily for 7 days. Take this with food.  May alternate  Tylenol  and ibuprofen for pain May remove dressing tonight and wash the area with soap and water.  Reapply a clean, dry dressing and change at least once daily.  May change more frequently if needed.  Keep a dressing in place until the area has completely healed which will likely take 4 to 5 days. Monitor for signs of worsening infection including increasing pain, redness, purulent drainage, fevers.  If this occurs return to urgent care  Final Clinical Impressions(s) / UC Diagnoses   Final diagnoses:  Cellulitis and abscess of buttock     Discharge Instructions      Incision and drainage done of left buttocks abscess.  There is still a significant amount of induration present and will start antibiotics by mouth.  We recommend the following: Doxycycline  100 mg twice daily for 7 days. Take this with food.  May alternate Tylenol  and ibuprofen for pain May remove dressing tonight and wash the area with soap and water.  Reapply a clean, dry dressing and change at least once daily.  May change more frequently if needed.  Keep a dressing in place until the area has completely healed which will likely take 4 to 5 days. Monitor for signs of worsening infection including increasing pain, redness, purulent drainage, fevers.  If this occurs return to urgent care   ED Prescriptions     Medication Sig Dispense Auth. Provider   doxycycline  (VIBRAMYCIN ) 100 MG capsule Take 1 capsule (100 mg total) by mouth 2 (two) times daily for 7 days. 14 capsule Teresa Almarie LABOR, [place]      PDMP not reviewed this encounter.   Teresa Almarie LABOR, PA-C 07/12/24 1636

## 2024-07-15 ENCOUNTER — Other Ambulatory Visit: Payer: Self-pay

## 2024-07-16 ENCOUNTER — Other Ambulatory Visit (HOSPITAL_COMMUNITY): Payer: Self-pay

## 2024-07-16 ENCOUNTER — Other Ambulatory Visit: Payer: Self-pay | Admitting: Pharmacist

## 2024-07-16 ENCOUNTER — Other Ambulatory Visit: Payer: Self-pay

## 2024-07-16 MED ORDER — SKYRIZI 150 MG/ML ~~LOC~~ SOSY: PREFILLED_SYRINGE | SUBCUTANEOUS | 0 refills | Status: DC

## 2024-07-16 MED ORDER — SKYRIZI 150 MG/ML ~~LOC~~ SOSY
PREFILLED_SYRINGE | SUBCUTANEOUS | 0 refills | Status: DC
  Filled 2024-07-16: qty 1, fill #0
  Filled 2024-07-17: qty 1, 84d supply, fill #0

## 2024-07-17 ENCOUNTER — Other Ambulatory Visit: Payer: Self-pay

## 2024-07-17 ENCOUNTER — Other Ambulatory Visit (HOSPITAL_COMMUNITY): Payer: Self-pay

## 2024-07-17 NOTE — Progress Notes (Signed)
 Specialty Pharmacy Refill Coordination Note  Linda Wilson is a 53 y.o. female contacted today regarding refills of specialty medication(s) Risankizumab -rzaa (Skyrizi )   Patient requested Delivery   Delivery date: 07/19/24   Verified address: [address] [facility] [place] [id number]   Medication will be filled on 07/18/24.

## 2024-07-30 DIAGNOSIS — Z79899 Other long term (current) drug therapy: Secondary | ICD-10-CM | POA: Diagnosis not present

## 2024-07-30 DIAGNOSIS — C44519 Basal cell carcinoma of skin of other part of trunk: Secondary | ICD-10-CM | POA: Diagnosis not present

## 2024-07-30 DIAGNOSIS — D485 Neoplasm of uncertain behavior of skin: Secondary | ICD-10-CM | POA: Diagnosis not present

## 2024-07-30 DIAGNOSIS — L409 Psoriasis, unspecified: Secondary | ICD-10-CM | POA: Diagnosis not present

## 2024-08-05 ENCOUNTER — Other Ambulatory Visit: Payer: Self-pay

## 2024-08-05 ENCOUNTER — Other Ambulatory Visit (HOSPITAL_COMMUNITY): Payer: Self-pay

## 2024-08-06 ENCOUNTER — Other Ambulatory Visit (HOSPITAL_COMMUNITY): Payer: Self-pay

## 2024-08-06 ENCOUNTER — Other Ambulatory Visit: Payer: Self-pay

## 2024-08-07 ENCOUNTER — Other Ambulatory Visit: Payer: Self-pay

## 2024-08-08 ENCOUNTER — Other Ambulatory Visit (HOSPITAL_COMMUNITY): Payer: Self-pay

## 2024-08-08 ENCOUNTER — Other Ambulatory Visit: Payer: Self-pay

## 2024-08-09 ENCOUNTER — Other Ambulatory Visit (HOSPITAL_COMMUNITY): Payer: Self-pay

## 2024-08-10 ENCOUNTER — Other Ambulatory Visit (HOSPITAL_COMMUNITY): Payer: Self-pay

## 2024-08-14 ENCOUNTER — Other Ambulatory Visit: Payer: Self-pay

## 2024-08-14 ENCOUNTER — Ambulatory Visit: Payer: Self-pay | Admitting: Nurse Practitioner

## 2024-08-14 ENCOUNTER — Ambulatory Visit
Admission: RE | Admit: 2024-08-14 | Discharge: 2024-08-14 | Disposition: A | Discharge status: Home / Self Care | Source: Ambulatory Visit | Attending: Nurse Practitioner | Admitting: Nurse Practitioner

## 2024-08-14 ENCOUNTER — Ambulatory Visit

## 2024-08-14 VITALS — BP 153/91 | HR 80 | Temp 97.6°F | Resp 18

## 2024-08-14 DIAGNOSIS — S60041A Contusion of right ring finger without damage to nail, initial encounter: Secondary | ICD-10-CM

## 2024-08-14 DIAGNOSIS — M7989 Other specified soft tissue disorders: Secondary | ICD-10-CM | POA: Diagnosis not present

## 2024-08-14 NOTE — Discharge Instructions (Addendum)
 Use of finger splint as needed for comfort and support.  Elevate and ice as needed.  You may take Tylenol  or ibuprofen as needed.  Follow-up with your PCP if your symptoms do not improve.  Please go to the ER for any worsening symptoms.  Hope you feel better soon!

## 2024-08-14 NOTE — ED Provider Notes (Signed)
 [facility]    CSN: [id number] Arrival date & time: 08/14/24  1543      History   Chief Complaint Chief Complaint  Patient presents with   Finger Injury    My right-hand ring finger got caught in a shopping cart.  I cannot put pressure on it, it feels numb and swollen.  I did ice t last night, but it throbbed all night long. - Entered by patient    HPI Linda Wilson is a 53 y.o. female presents for finger pain.  Patient reports yesterday while putting carts away at a store she got her proximal right fourth finger caught between 2 carts.  Reports pain and swelling at the PIP joint.  No bruising numbness or tingling.  No injury to the distal finger or nail.  No history of injuries or surgeries to the finger in the past.  She has been taking Tylenol  for symptoms.  No other concerns at this time.  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   Tear of lateral meniscus of knee 02/20/2022    Past Surgical History:  Procedure Laterality Date   BIOPSY  11/02/2022   Procedure: BIOPSY;  Surgeon: San Sandor GAILS, DO;  Location: [facility];  Service: Gastroenterology;;   CESAREAN SECTION     CHOLECYSTECTOMY N/A 11/17/2022   Procedure: LAPAROSCOPIC CHOLECYSTECTOMY WITH INTRAOPERATIVE CHOLANGIOGRAM;  Surgeon: Eletha Boas, MD;  Location: [facility];  Service: General;  Laterality: N/A;   ESOPHAGOGASTRODUODENOSCOPY (EGD) WITH PROPOFOL  N/A 11/02/2022   Procedure: ESOPHAGOGASTRODUODENOSCOPY (EGD) WITH PROPOFOL ;   Surgeon: San Sandor GAILS, DO;  Location: [facility];  Service: Gastroenterology;  Laterality: N/A;   INCISION AND DRAINAGE ABSCESS  02/15/2007   pubic abscess - I&D Dr Curvin   LIVER BIOPSY N/A 11/17/2022   Procedure: LIVER BIOPSY;  Surgeon: Eletha Boas, MD;  Location: [facility];  Service: General;  Laterality: N/A;   TUBAL LIGATION  09/16/2005   Boas Deaner, MD    OB History   No obstetric history on file.      Home Medications    Prior to Admission medications   Medication Sig Start Date End Date Taking? Authorizing Provider  guselkumab [facility]) 100 MG/ML prefilled syringe Inject 100 mg into the skin every 2 (two) months.   Yes [provider]  atorvastatin  (LIPITOR) 20 MG tablet Take 1 tablet (20 mg total) by mouth daily. 01/16/24   Lavell Lye A, FNP  calcipotriene  (DOVONOX) 0.005 % cream Apply 1 Application topically 2 (two) times daily. 05/02/23     clobetasol  cream (TEMOVATE ) 0.05 % Apply 1 Application topically 2 (two) times daily on weekends. 07/09/24     cyclobenzaprine  (FLEXERIL ) 10 MG tablet Take 1 tablet (10 mg total) by mouth 3 (three) times daily as needed for muscle spasms. 04/25/24   Moishe Chiquita HERO, NP  ferrous sulfate  325 (65 FE) MG EC tablet Take 1 tablet (325 mg total) by mouth 2 (two) times daily. 06/13/24 06/13/25  Lavell Lye LABOR, FNP  glucosamine-chondroitin 500-400 MG tablet Take 1 tablet by mouth daily.    [provider]  hydrocortisone  1 % ointment Apply 1 Application topically 2 (two) times daily. 02/29/24   Rolan Berthold, PA-C  lidocaine  (XYLOCAINE ) 2 % solution Use as directed 5 mLs in the mouth or throat as needed (sore throat). 07/03/24   Vivienne Delon HERO, PA-C  lisinopril -hydrochlorothiazide  (ZESTORETIC ) 20-12.5 MG tablet Take 1 tablet by mouth daily. 01/15/24   Lavell Bari LABOR, FNP  loratadine (CLARITIN) 10 MG tablet Take 10 mg by mouth daily.    [provider]  naproxen  (NAPROSYN ) 500 MG tablet Take 1 tablet (500 mg  total) by mouth 2 (two) times daily with a meal. 07/03/24   Burnette, Delon HERO, PA-C  ondansetron  (ZOFRAN ) 4 MG tablet Take 1 tablet (4 mg total) by mouth every 8 (eight) hours as needed for nausea or vomiting. Patient taking differently: Take 4 mg by mouth daily as needed for nausea or vomiting. 10/31/22   Lavell Bari LABOR, FNP  phentermine  (ADIPEX-P ) 37.5 MG tablet Take 1 tablet (37.5 mg total) by mouth daily before breakfast. 06/13/24   Lavell Bari LABOR, FNP  risankizumab -rzaa (SKYRIZI ) 150 MG/ML SOSY prefilled syringe 1 mL Subcutaneous every 12 weeks 07/16/24   Jegede, Olugbemiga E, MD  tamsulosin  (FLOMAX ) 0.4 MG CAPS capsule Take 1 capsule (0.4 mg total) by mouth daily. 02/23/24   Lavell Bari LABOR, FNP  triamcinolone  cream (KENALOG ) 0.1 % Apply 1 Application topically 2 (two) times daily. 03/10/24   Almeda Loa ORN, FNP    Family History Family History  Problem Relation Age of Onset   Healthy Mother    Heart disease Father        MI   Diabetes Father    Stroke Father    Colon polyps Neg Hx    Colon cancer Neg Hx    Esophageal cancer Neg Hx    Rectal cancer Neg Hx    Stomach cancer Neg Hx     Social History Social History   Tobacco Use   Smoking status: Never   Smokeless tobacco: Never  Vaping Use   Vaping status: Never Used  Substance Use Topics   Alcohol use: No   Drug use: No     Allergies   Deucravacitinib   Review of Systems Review of Systems  Musculoskeletal:        Right fourth finger injury     Physical Exam Triage Vital Signs ED Triage Vitals  Encounter Vitals Group     BP 08/14/24 1604 (!) 153/91     Girls Systolic BP Percentile --      Girls Diastolic BP Percentile --      Boys Systolic BP Percentile --      Boys Diastolic BP Percentile --      Pulse Rate 08/14/24 1604 80     Resp 08/14/24 1604 18     Temp 08/14/24 1604 97.6 F (36.4 C)     Temp Source 08/14/24 1604 Oral     SpO2 08/14/24 1604 94 %     Weight --      Height --      Head  Circumference --      Peak Flow --      Pain Score 08/14/24 1601 10     Pain Loc --      Pain Education --      Exclude from Growth Chart --    No data found.  Updated Vital Signs BP ROLLEN)  153/91   Pulse 80   Temp 97.6 F (36.4 C) (Oral)   Resp 18   LMP 11/09/2014   SpO2 94%   Visual Acuity Right Eye Distance:   Left Eye Distance:   Bilateral Distance:    Right Eye Near:   Left Eye Near:    Bilateral Near:     Physical Exam Vitals and nursing note reviewed.  Constitutional:      General: She is not in acute distress.    Appearance: Normal appearance. She is not ill-appearing.  HENT:     Head: Normocephalic and atraumatic.  Eyes:     Pupils: Pupils are equal, round, and reactive to light.  Cardiovascular:     Rate and Rhythm: Normal rate.  Pulmonary:     Effort: Pulmonary effort is normal.  Musculoskeletal:       Hands:     Comments: Mild swelling without ecchymosis of the proximal right fourth finger from MCP to PIP joint.  Tender to palpation to site with reduced range of motion due to pain and swelling.  No deformity.  No tenderness to DIP or distal finger.  Nail is intact.    Skin:    General: Skin is warm and dry.  Neurological:     General: No focal deficit present.     Mental Status: She is alert and oriented to person, place, and time.  Psychiatric:        Mood and Affect: Mood normal.        Behavior: Behavior normal.      UC Treatments / Results  Labs (all labs ordered are listed, but only abnormal results are displayed) Labs Reviewed - No data to display  EKG   Radiology DG Finger Ring Right Result Date: 08/14/2024 CLINICAL DATA:  Pain and swelling after slamming finger in door. EXAM: RIGHT RING FINGER 2+V COMPARISON:  None Available. FINDINGS: There is no evidence of fracture or dislocation. Joint spaces are maintained. Soft tissue swelling of the proximal fourth digit. No radiopaque foreign body. IMPRESSION: No acute osseous abnormality.  Electronically Signed   By: Harrietta Sherry [name]   On: 08/14/2024 16:36    Procedures Procedures (including critical care time)  Medications Ordered in UC Medications - No data to display  Initial Impression / Assessment and Plan / UC Course  I have reviewed the triage vital signs and the nursing notes.  Pertinent labs & imaging results that were available during my care of the patient were reviewed by me and considered in my medical decision making (see chart for details).     Reviewed exam and symptoms with patient.  X-ray negative.  Discussed contusion of finger and RICE therapy.  Finger splint applied for comfort.  Continue OTC analgesics as needed and PCP follow-up if symptoms do not improve.  ER precautions reviewed Final Clinical Impressions(s) / UC Diagnoses   Final diagnoses:  Contusion of right ring finger without damage to nail, initial encounter     Discharge Instructions      Use of finger splint as needed for comfort and support.  Elevate and ice as needed.  You may take Tylenol  or ibuprofen as needed.  Follow-up with your PCP if your symptoms do not improve.  Please go to the ER for any worsening symptoms.  Hope you feel better soon!    ED Prescriptions   None    PDMP not reviewed this encounter.   Loreda Myla SAUNDERS, NP 08/14/24 [phone]

## 2024-08-14 NOTE — ED Triage Notes (Signed)
 Pt states her right 4th finger got smashed between 2 carts last night. Pt has 1+ swelling of finger. PT c/o pain in finger and unable bend or straighten finger.

## 2024-08-17 ENCOUNTER — Telehealth: Payer: Self-pay

## 2024-08-17 NOTE — Telephone Encounter (Signed)
 Pt needs to return to the office to place a new splint.

## 2024-08-20 ENCOUNTER — Other Ambulatory Visit: Payer: Self-pay

## 2024-09-04 ENCOUNTER — Telehealth: Payer: Self-pay | Admitting: Pharmacist

## 2024-09-04 NOTE — Telephone Encounter (Signed)
 Called patient to schedule an appointment for the [facility] [employer] Specialty Medication [facility]. I was unable to reach the patient so I left a HIPAA-compliant message requesting that the patient return my call.   Linda Wilson, PharmD, JAQUELINE, CPP Clinical Pharmacist [facility] [phone]

## 2024-09-09 ENCOUNTER — Other Ambulatory Visit (HOSPITAL_COMMUNITY): Payer: Self-pay

## 2024-09-09 ENCOUNTER — Other Ambulatory Visit: Payer: Self-pay | Admitting: Family

## 2024-09-10 ENCOUNTER — Other Ambulatory Visit (HOSPITAL_COMMUNITY): Payer: Self-pay

## 2024-09-10 ENCOUNTER — Encounter (HOSPITAL_COMMUNITY): Payer: Self-pay

## 2024-09-10 ENCOUNTER — Other Ambulatory Visit: Payer: Self-pay

## 2024-09-11 ENCOUNTER — Other Ambulatory Visit (HOSPITAL_COMMUNITY): Payer: Self-pay

## 2024-09-11 MED ORDER — CLOBETASOL PROPIONATE 0.05 % EX CREA
1.0000 | TOPICAL_CREAM | Freq: Two times a day (BID) | CUTANEOUS | 0 refills | Status: DC
  Filled 2024-09-11: qty 45, 90d supply, fill #0

## 2024-09-13 ENCOUNTER — Other Ambulatory Visit: Payer: Self-pay

## 2024-09-17 ENCOUNTER — Other Ambulatory Visit: Payer: Self-pay | Admitting: Pharmacist

## 2024-09-17 ENCOUNTER — Other Ambulatory Visit: Payer: Self-pay

## 2024-09-17 ENCOUNTER — Other Ambulatory Visit (HOSPITAL_COMMUNITY): Payer: Self-pay

## 2024-09-17 ENCOUNTER — Telehealth: Payer: Self-pay | Admitting: Pharmacist

## 2024-09-17 ENCOUNTER — Ambulatory Visit: Attending: Family | Admitting: Pharmacist

## 2024-09-17 ENCOUNTER — Encounter: Payer: Self-pay | Admitting: Pharmacist

## 2024-09-17 DIAGNOSIS — Z7189 Other specified counseling: Secondary | ICD-10-CM

## 2024-09-17 MED ORDER — TREMFYA 100 MG/ML ~~LOC~~ SOSY
PREFILLED_SYRINGE | SUBCUTANEOUS | 3 refills | Status: DC
  Filled 2024-09-17: qty 1, 28d supply, fill #0

## 2024-09-17 MED ORDER — TREMFYA 100 MG/ML ~~LOC~~ SOSY
PREFILLED_SYRINGE | SUBCUTANEOUS | 3 refills | Status: DC
  Filled 2024-09-17: qty 1, 56d supply, fill #0

## 2024-09-17 NOTE — Progress Notes (Signed)
 See OV for complete documentation.   Marene Shape, PharmD, Becky Bowels, CPP Clinical Pharmacist [facility] [phone]

## 2024-09-17 NOTE — Progress Notes (Signed)
" ° °  S: Patient presents today for review of their specialty medication.   Patient is currently taking Tremfya  for plaque psoriasis. Patient is managed by Rocky Pereyra for this.   Dosing: Plaque psoriasis: SubQ: 100 mg at weeks 0, 4, and then every 8 weeks thereafter.  Adherence: confirms  Efficacy: reports good results early in therapy.  Monitoring:  S/sx of infection: none  S/sx of hypersensitivity: none   Current adverse effects: none   O:     Lab Results  Component Value Date   WBC 10.0 06/10/2024   HGB 14.4 06/10/2024   HCT 44.8 06/10/2024   MCV 84 06/10/2024   PLT 264 06/10/2024      Chemistry      Component Value Date/Time   NA 140 06/10/2024 1521   K 4.2 06/10/2024 1521   CL 98 06/10/2024 1521   CO2 24 06/10/2024 1521   BUN 6 06/10/2024 1521   CREATININE 0.70 06/10/2024 1521      Component Value Date/Time   CALCIUM  9.7 06/10/2024 1521   ALKPHOS 116 06/10/2024 1521   AST 40 06/10/2024 1521   ALT 34 (H) 06/10/2024 1521   BILITOT 1.0 06/10/2024 1521       A/P: 1. Medication review: patient currently on Tremfya  for plaque psoriasis. Reviewed the medication with the patient, including the following: Tremfya  is a medication used in the treatment of plaque psoriasis. Administer SubQ into front of thighs, lower abdomen (except for 2 inches around navel), or back of upper arms; do not inject into areas where the skin is tender, bruised, red, hard, thick, scaly, or affected by psoriasis. Possible adverse reactions include increased risk of infection, headache, and hypersensitivity reactions. Live vaccinations should be avoided. No recommendations for any changes.  Herlene Fleeta Morris, PharmD, JAQUELINE, CPP Clinical Pharmacist [facility] [phone]      "

## 2024-09-17 NOTE — Progress Notes (Signed)
 Pharmacy Patient Advocate Encounter  Insurance verification completed.   The patient is insured through [facility]   Ran test claim for Tremfya . Co-pay is $0.  This test claim was processed through [facility]- copay amounts may vary at other pharmacies due to pharmacy/plan contracts, or as the patient moves through the different stages of their insurance plan.

## 2024-09-17 NOTE — Progress Notes (Deleted)
 Specialty Pharmacy Initial Fill Coordination Note  Linda Wilson is a 53 y.o. female contacted today regarding initial fill of specialty medication(s) Guselkumab  (Tremfya )   Patient requested Delivery   Delivery date: 10/22/24   Verified address: [address]   Medication will be filled on: 10/21/24   Patient is aware of $0 copayment.

## 2024-09-17 NOTE — Telephone Encounter (Signed)
 Called patient to schedule an appointment for the [facility] [employer] Specialty Medication Clinic. I was unable to reach the patient so I left a HIPAA-compliant message requesting that the patient return my call.   Herlene Fleeta Morris, PharmD, JAQUELINE, CPP Clinical Pharmacist [facility] [phone]

## 2024-09-17 NOTE — Progress Notes (Signed)
 Patient received loading dose in office. Next dose due around 2/3

## 2024-09-17 NOTE — Progress Notes (Signed)
 Medication is being changed to Tremfya . 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

## 2024-09-17 NOTE — Progress Notes (Signed)
 Specialty Pharmacy Initial Fill Coordination Note  Linda Wilson is a 53 y.o. female contacted today regarding initial fill of specialty medication(s) Guselkumab  (Tremfya )   Patient requested Delivery   Delivery date: 10/22/24   Verified address: [address]   Medication will be filled on: 10/21/24   Patient is aware of $0 copayment.

## 2024-09-18 ENCOUNTER — Other Ambulatory Visit: Payer: Self-pay

## 2024-09-23 ENCOUNTER — Other Ambulatory Visit: Payer: Self-pay

## 2024-10-11 ENCOUNTER — Other Ambulatory Visit (HOSPITAL_COMMUNITY): Payer: Self-pay

## 2024-10-14 ENCOUNTER — Other Ambulatory Visit (HOSPITAL_COMMUNITY): Payer: Self-pay

## 2024-10-14 ENCOUNTER — Other Ambulatory Visit: Payer: Self-pay

## 2024-10-14 MED ORDER — CLOBETASOL PROPIONATE 0.05 % EX CREA
TOPICAL_CREAM | CUTANEOUS | 1 refills | Status: AC
  Filled 2024-10-14: qty 45, 30d supply, fill #0
  Filled 2024-10-27: qty 45, 90d supply, fill #0
  Filled 2024-10-31: qty 45, 30d supply, fill #0

## 2024-10-15 ENCOUNTER — Other Ambulatory Visit (HOSPITAL_COMMUNITY): Payer: Self-pay

## 2024-10-15 ENCOUNTER — Other Ambulatory Visit: Payer: Self-pay

## 2024-10-16 ENCOUNTER — Other Ambulatory Visit: Payer: Self-pay

## 2024-10-16 ENCOUNTER — Telehealth: Payer: Self-pay

## 2024-10-16 NOTE — Telephone Encounter (Signed)
 Pharmacy Patient Advocate Encounter   Received notification from Pt Calls Messages that prior authorization for Tremfya  is required/requested.   Insurance verification completed.   The patient is insured through [facility].   Per test claim: PA required; PA submitted to above mentioned insurance via Latent Key/confirmation #/EOC [id number] Status is pending

## 2024-10-16 NOTE — Progress Notes (Signed)
 PA pending

## 2024-10-17 ENCOUNTER — Telehealth: Admitting: Physician Assistant

## 2024-10-17 ENCOUNTER — Other Ambulatory Visit: Payer: Self-pay

## 2024-10-17 DIAGNOSIS — L0292 Furuncle, unspecified: Secondary | ICD-10-CM | POA: Diagnosis not present

## 2024-10-17 NOTE — Telephone Encounter (Signed)
 Pharmacy Patient Advocate Encounter  Received notification from [facility] that Prior Authorization for Tremfya  has been APPROVED from 10/16/24 to 09/15/25   PA #/Case ID/Reference #: [id number]

## 2024-10-18 ENCOUNTER — Ambulatory Visit (INDEPENDENT_AMBULATORY_CARE_PROVIDER_SITE_OTHER)

## 2024-10-18 ENCOUNTER — Encounter (HOSPITAL_COMMUNITY): Payer: Self-pay

## 2024-10-18 ENCOUNTER — Ambulatory Visit (HOSPITAL_COMMUNITY)
Admission: RE | Admit: 2024-10-18 | Discharge: 2024-10-18 | Disposition: A | Discharge status: Home / Self Care | Source: Ambulatory Visit | Attending: Physician Assistant | Admitting: Physician Assistant

## 2024-10-18 VITALS — BP 149/81 | HR 82 | Temp 97.7°F | Resp 18

## 2024-10-18 DIAGNOSIS — S92301A Fracture of unspecified metatarsal bone(s), right foot, initial encounter for closed fracture: Secondary | ICD-10-CM

## 2024-10-18 DIAGNOSIS — M25571 Pain in right ankle and joints of right foot: Secondary | ICD-10-CM

## 2024-10-18 MED ORDER — IBUPROFEN 600 MG PO TABS: 600.0000 mg | ORAL_TABLET | Freq: Three times a day (TID) | ORAL | 0 refills | Status: AC | PRN

## 2024-10-18 MED ORDER — CEPHALEXIN 500 MG PO CAPS: 500.0000 mg | ORAL_CAPSULE | Freq: Four times a day (QID) | ORAL | 0 refills | Status: AC

## 2024-10-18 NOTE — Discharge Instructions (Signed)
 As we discussed, the radiologist was concerned that there might have been a small piece of bone that was pulled off by ligament when you injured your foot on the outside of your foot.  Please use the cam boot and keep your foot elevated.  You can apply ice for 15 minutes at a time 3-4 times a day.  Take ibuprofen for pain relief.  Do not take additional NSAIDs (aspirin, ibuprofen/Advil, naproxen /Aleve ) with this medication as it can cause stomach issues but you can use acetaminophen /Tylenol  for breakthrough pain.  I would like you to follow-up with a foot and ankle specialist; call them to schedule an appointment.  If anything worsens you have increasing pain, difficulty walking, numbness or tingling in your foot, discoloration of your foot you should be seen immediately.

## 2024-10-18 NOTE — ED Provider Notes (Signed)
 " [facility]    CSN: [id number] Arrival date & time: 10/18/24  1523      History   Chief Complaint Chief Complaint  Patient presents with   Appointment    HPI Linda Wilson is a 54 y.o. female.   Patient presents today with a several day history of right ankle and foot pain.  She reports that she stepped off the curb while at work and injured her foot.  She is unsure if she inverted or everted the foot but has had ongoing pain since this injury.  She has minimal pain at rest but reports that it increases to 6 on 0-10 pain scale, described as throbbing, worse with attempted ambulation, no alleviating factors identified.  She has tried Tylenol  and ibuprofen with her last dose earlier this morning which provides temporary improvement of symptoms.  Denies any numbness or paresthesias in her foot.  She denies previous injury or surgery.  She is concerned because she continues to have significant pain and difficulty bearing weight prompting evaluation today.    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   Tear of lateral meniscus of knee 02/20/2022    Past Surgical History:  Procedure Laterality Date   BIOPSY  11/02/2022   Procedure: BIOPSY;  Surgeon: San Sandor GAILS, DO;  Location: [facility];  Service: Gastroenterology;;   CESAREAN SECTION     CHOLECYSTECTOMY N/A 11/17/2022   Procedure: LAPAROSCOPIC CHOLECYSTECTOMY WITH INTRAOPERATIVE CHOLANGIOGRAM;  Surgeon: Eletha Boas, MD;  Location: [facility];   Service: General;  Laterality: N/A;   ESOPHAGOGASTRODUODENOSCOPY (EGD) WITH PROPOFOL  N/A 11/02/2022   Procedure: ESOPHAGOGASTRODUODENOSCOPY (EGD) WITH PROPOFOL ;  Surgeon: San Sandor GAILS, DO;  Location: [facility];  Service: Gastroenterology;  Laterality: N/A;   INCISION AND DRAINAGE ABSCESS  02/15/2007   pubic abscess - I&D Dr Curvin   LIVER BIOPSY N/A 11/17/2022   Procedure: LIVER BIOPSY;  Surgeon: Eletha Boas, MD;  Location: [facility];  Service: General;  Laterality: N/A;   TUBAL LIGATION  09/16/2005   Boas Deaner, MD    OB History   No obstetric history on file.      Home Medications    Prior to Admission medications  Medication Sig Start Date End Date Taking? Authorizing Provider  ibuprofen (ADVIL) 600 MG tablet Take 1 tablet (600 mg total) by mouth every 8 (eight) hours as needed. 10/18/24  Yes Kenniel Bergsma K, PA-C  atorvastatin  (LIPITOR) 20 MG tablet Take 1 tablet (20 mg total) by mouth daily. 01/16/24   Lavell Lye A, FNP  calcipotriene  (DOVONOX) 0.005 % cream Apply 1 Application topically 2 (two) times daily. 05/02/23     cephALEXin  (KEFLEX ) 500 MG capsule Take 1 capsule (500 mg total) by mouth 4 (four) times daily. 10/18/24   Vivienne Delon HERO, PA-C  clobetasol  cream (TEMOVATE ) 0.05 % Apply 1 Application topically 2 (two) times daily on weekends. 10/14/24     cyclobenzaprine  (FLEXERIL ) 10 MG tablet Take 1 tablet (10 mg total) by mouth 3 (three) times  daily as needed for muscle spasms. 04/25/24   Moishe Chiquita HERO, NP  ferrous sulfate  325 (65 FE) MG EC tablet Take 1 tablet (325 mg total) by mouth 2 (two) times daily. 06/13/24 06/13/25  Lavell Lye A, FNP  glucosamine-chondroitin 500-400 MG tablet Take 1 tablet by mouth daily.    [provider]  guselkumab  (TREMFYA ) 100 MG/ML prefilled syringe inject 1mL subcutaneously every 8 weeks 09/17/24   Jegede, Olugbemiga E, MD  hydrocortisone  1 % ointment Apply 1 Application topically 2 (two) times daily. 02/29/24   Rolan Berthold, PA-C  lidocaine  (XYLOCAINE ) 2 % solution Use as directed 5 mLs in the mouth or throat as needed (sore throat). 07/03/24   Vivienne Delon HERO, PA-C  lisinopril -hydrochlorothiazide  (ZESTORETIC ) 20-12.5 MG tablet Take 1 tablet by mouth daily. 01/15/24   Lavell Lye LABOR, FNP  loratadine (CLARITIN) 10 MG tablet Take 10 mg by mouth daily.    [provider]  ondansetron  (ZOFRAN ) 4 MG tablet Take 1 tablet (4 mg total) by mouth every 8 (eight) hours as needed for nausea or vomiting. Patient taking differently: Take 4 mg by mouth daily as needed for nausea or vomiting. 10/31/22   Lavell Lye LABOR, FNP  phentermine  (ADIPEX-P ) 37.5 MG tablet Take 1 tablet (37.5 mg total) by mouth daily before breakfast. 06/13/24   Lavell Lye A, FNP  tamsulosin  (FLOMAX ) 0.4 MG CAPS capsule Take 1 capsule (0.4 mg total) by mouth daily. 02/23/24   Lavell Lye LABOR, FNP  triamcinolone  cream (KENALOG ) 0.1 % Apply 1 Application topically 2 (two) times daily. 03/10/24   Almeda Loa ORN, FNP    Family History Family History  Problem Relation Age of Onset   Healthy Mother    Heart disease Father        MI   Diabetes Father    Stroke Father    Colon polyps Neg Hx    Colon cancer Neg Hx    Esophageal cancer Neg Hx    Rectal cancer Neg Hx    Stomach cancer Neg Hx     Social History Social History[1]   Allergies   Deucravacitinib   Review of Systems Review of Systems  Constitutional:  Positive for activity change. Negative for appetite change, fatigue and fever.  Musculoskeletal:  Positive for arthralgias and gait problem. Negative for back pain, joint swelling and myalgias.  Skin:  Negative for color change and wound.  Neurological:  Negative for weakness and numbness.     Physical Exam Triage Vital Signs ED Triage Vitals  Encounter Vitals Group     BP 10/18/24 1601 (!) 149/81     Girls Systolic BP Percentile --      Girls Diastolic BP Percentile --      Boys Systolic BP Percentile --       Boys Diastolic BP Percentile --      Pulse Rate 10/18/24 1601 82     Resp 10/18/24 1601 18     Temp 10/18/24 1601 97.7 F (36.5 C)     Temp Source 10/18/24 1601 Oral     SpO2 10/18/24 1601 97 %     Weight --      Height --      Head Circumference --      Peak Flow --      Pain Score 10/18/24 1600 6     Pain Loc --      Pain Education --      Exclude from Growth Chart --  No data found.  Updated Vital Signs BP (!) 149/81 (BP Location: Right Arm)   Pulse 82   Temp 97.7 F (36.5 C) (Oral)   Resp 18   LMP 11/09/2014   SpO2 97%   Visual Acuity Right Eye Distance:   Left Eye Distance:   Bilateral Distance:    Right Eye Near:   Left Eye Near:    Bilateral Near:     Physical Exam Vitals reviewed.  Constitutional:      General: She is awake. She is not in acute distress.    Appearance: Normal appearance. She is well-developed. She is not ill-appearing.     Comments: Very pleasant female appears stated age in no acute distress sitting comfortable in exam room  HENT:     Head: Normocephalic and atraumatic.  Cardiovascular:     Rate and Rhythm: Normal rate and regular rhythm.     Heart sounds: Normal heart sounds, S1 normal and S2 normal. No murmur heard.    Comments: Capillary refill within 2 seconds right toes Pulmonary:     Effort: Pulmonary effort is normal.     Breath sounds: Normal breath sounds. No wheezing, rhonchi or rales.     Comments: Clear to auscultation bilaterally Abdominal:     General: Bowel sounds are normal.     Palpations: Abdomen is soft.     Tenderness: There is no abdominal tenderness. There is no right CVA tenderness, left CVA tenderness, guarding or rebound.  Musculoskeletal:     Right ankle: No swelling. Tenderness present. Anterior drawer test negative.     Right foot: Tenderness present. No swelling or bony tenderness.     Comments: Right foot/ankle: Significant tenderness palpation over anterior right ankle and at the proximal dorsal foot  without deformity.  Foot is neurovascularly intact.  No wound, ecchymosis, discoloration noted.  Antalgic gait.  Psychiatric:        Behavior: Behavior is cooperative.      UC Treatments / Results  Labs (all labs ordered are listed, but only abnormal results are displayed) Labs Reviewed - No data to display  EKG   Radiology DG Foot Complete Right Result Date: 10/18/2024 EXAM: 3 VIEW(S) XRAY OF THE FOOT 10/18/2024 04:45:26 PM COMPARISON: None available. CLINICAL HISTORY: Foot and ankle pain. FINDINGS: BONES AND JOINTS: Mild hallux valgus deformity. Mild degenerative changes in midfoot. Tiny ossific fragment along the base of the 5th metatarsal. SOFT TISSUES: Unremarkable. No radiopaque foreign body. IMPRESSION: 1. A tiny ossific fragment along the base of the 5th metatarsal could represent a remote fracture or acute avulsion fracture. Correlation with point tenderness recommended to assess for acuity. Electronically signed by: Rogelia Myers MD 10/18/2024 05:17 PM EST RP Workstation: [id number]   DG Ankle Complete Right Result Date: 10/18/2024 EXAM: 3 OR MORE VIEW(S) XRAY OF THE ANKLE 10/18/2024 04:45:26 PM CLINICAL HISTORY: Foot and ankle pain. COMPARISON: None available. FINDINGS: BONES AND JOINTS: No acute fracture. The ankle mortise is not widened and the talar dome is intact. No malalignment. Mild degenerative changes of the ankle and midfoot. Achilles tendon enthesophyte. SOFT TISSUES: Mild generalized soft tissue edema. IMPRESSION: 1. Mild generalized soft tissue edema. No acute fracture or dislocation. Electronically signed by: Rogelia Myers MD 10/18/2024 05:03 PM EST RP Workstation: [id number]    Procedures Procedures (including critical care time)  Medications Ordered in UC Medications - No data to display  Initial Impression / Assessment and Plan / UC Course  I have reviewed the triage vital signs and the  nursing notes.  Pertinent labs & imaging results that were available  during my care of the patient were reviewed by me and considered in my medical decision making (see chart for details).     Patient is well-appearing, afebrile, nontoxic, nontachycardic.  Foot is neurovascularly intact.  X-ray of foot and ankle were obtained given her bony tenderness that showed possible avulsion fracture at base of fifth metatarsal.  Patient was placed in a cam boot for comfort and support and we discussed that she should keep her foot elevated and apply ice 15 minutes at a time 3 to 4 times a day for additional symptom relief.  She was given ibuprofen 600 mg to be taken up to 3 times a day and we discussed that this should not be combined with additional NSAIDs due to risk of GI bleeding.  No indication for dose adjustment based on metabolic panel from 06/10/2024 with a creatinine of 0.7 and calculated creatinine clearance of 197 mL/min.  She can use Tylenol /acetaminophen  for breakthrough pain.  I recommend close follow-up with podiatry and she was given the contact information for [facility] with instruction to call to schedule an appointment as soon as possible.  We discussed that if anything worsens or changes she should return for reevaluation.  Return precautions given.  Excuse note provided.  Final Clinical Impressions(s) / UC Diagnoses   Final diagnoses:  Pain in joint involving right ankle and foot  Closed avulsion fracture of metatarsal bone of right foot, initial encounter     Discharge Instructions      As we discussed, the radiologist was concerned that there might have been a small piece of bone that was pulled off by ligament when you injured your foot on the outside of your foot.  Please use the cam boot and keep your foot elevated.  You can apply ice for 15 minutes at a time 3-4 times a day.  Take ibuprofen for pain relief.  Do not take additional NSAIDs (aspirin, ibuprofen/Advil, naproxen /Aleve ) with this medication as it can cause stomach issues but you  can use acetaminophen /Tylenol  for breakthrough pain.  I would like you to follow-up with a foot and ankle specialist; call them to schedule an appointment.  If anything worsens you have increasing pain, difficulty walking, numbness or tingling in your foot, discoloration of your foot you should be seen immediately.     ED Prescriptions     Medication Sig Dispense Auth. Provider   ibuprofen (ADVIL) 600 MG tablet Take 1 tablet (600 mg total) by mouth every 8 (eight) hours as needed. 30 tablet Kathlyn Leachman K, PA-C      PDMP not reviewed this encounter.     [1]  Social History Tobacco Use   Smoking status: Never   Smokeless tobacco: Never  Vaping Use   Vaping status: Never Used  Substance Use Topics   Alcohol use: No   Drug use: No     Sherrell Rocky POUR, PA-C 10/18/24 1754  "

## 2024-10-18 NOTE — Progress Notes (Signed)
 E Visit for Cellulitis  We are sorry that you are not feeling well. Here is how we plan to help!  Based on what you shared, it appears you may have boils.   I have prescribed:  Keflex  500 mg -- Take one by mouth four times a day for 5 days  HOME CARE:  Take all medications as prescribed and be sure to complete the full course - even if your skin appears to be improving.   GET HELP RIGHT AWAY IF:  Symptoms do not begin to improve within 48 hours. Severe redness persists or worsens. If the area turns color, spreads or swells. If it blisters and opens, develops yellow-brown crust or bleeds. You develop a fever or chills. If the pain increases or becomes unbearable.  Are unable to keep fluids and food down.  MAKE SURE YOU   Understand these instructions. Will watch your condition. Will get help right away if you are not doing well or get worse.  Thank you for choosing an e-visit.   Your e-visit answers were reviewed by a board certified advanced clinical practitioner to complete your personal care plan. Depending upon the condition, your plan could have included both over the counter or prescription medications.   Please review your pharmacy choice. Make sure the pharmacy is open so you can pick up the prescription now. If there is a problem, you may contact your provider through [employer] and have the prescription routed to another pharmacy.   Your safety is important to us . If you have drug allergies, check your prescription carefully.   For the next 24 hours you can use MyChart to ask questions about today's visit, request a non-urgent call back, or ask for a work or school excuse.   You will receive an email in the next two days asking about your experience. I hope that your e-visit has been valuable and will speed up your recovery.    I have spent 5 minutes in review of e-visit questionnaire, review and updating patient chart, medical decision making and response to  patient.   Delon CHRISTELLA Dickinson, PA-C

## 2024-10-18 NOTE — ED Triage Notes (Signed)
 Pt reports that she twist her right ankle on curb couple days ago. Pt reports still having pain esp with movement and weight bearing.

## 2024-10-21 ENCOUNTER — Other Ambulatory Visit: Payer: Self-pay

## 2024-10-28 ENCOUNTER — Other Ambulatory Visit: Payer: Self-pay

## 2024-10-28 ENCOUNTER — Ambulatory Visit: Admitting: Podiatry

## 2024-10-28 ENCOUNTER — Other Ambulatory Visit (HOSPITAL_COMMUNITY): Payer: Self-pay

## 2024-10-29 ENCOUNTER — Other Ambulatory Visit: Payer: Self-pay

## 2024-10-29 ENCOUNTER — Other Ambulatory Visit (HOSPITAL_COMMUNITY): Payer: Self-pay

## 2024-10-29 MED ORDER — CLOBETASOL PROPIONATE 0.05 % EX CREA
1.0000 | TOPICAL_CREAM | Freq: Two times a day (BID) | CUTANEOUS | 1 refills | Status: AC
  Filled 2024-10-29 – 2024-10-31 (×2): qty 45, 45d supply, fill #0

## 2024-10-29 MED ORDER — TREMFYA 100 MG/ML ~~LOC~~ SOSY: 100.0000 mg | PREFILLED_SYRINGE | SUBCUTANEOUS | 5 refills | Status: DC

## 2024-10-30 ENCOUNTER — Other Ambulatory Visit: Payer: Self-pay | Admitting: Pharmacist

## 2024-10-30 ENCOUNTER — Other Ambulatory Visit: Payer: Self-pay

## 2024-10-30 MED ORDER — TREMFYA 100 MG/ML ~~LOC~~ SOSY
100.0000 mg | PREFILLED_SYRINGE | SUBCUTANEOUS | 5 refills | Status: AC
  Filled 2024-10-30: qty 1, 56d supply, fill #0

## 2024-10-31 ENCOUNTER — Ambulatory Visit: Admitting: Podiatry

## 2024-10-31 ENCOUNTER — Other Ambulatory Visit (HOSPITAL_COMMUNITY): Payer: Self-pay

## 2024-10-31 ENCOUNTER — Encounter: Payer: Self-pay | Admitting: Podiatry

## 2024-10-31 VITALS — Ht 69.0 in | Wt 296.0 lb

## 2024-10-31 DIAGNOSIS — B351 Tinea unguium: Secondary | ICD-10-CM

## 2024-10-31 DIAGNOSIS — S92301A Fracture of unspecified metatarsal bone(s), right foot, initial encounter for closed fracture: Secondary | ICD-10-CM

## 2024-10-31 MED ORDER — TERBINAFINE HCL 250 MG PO TABS
250.0000 mg | ORAL_TABLET | Freq: Every day | ORAL | 0 refills | Status: AC
  Filled 2024-10-31: qty 90, 90d supply, fill #0

## 2024-10-31 MED ORDER — EFINACONAZOLE 10 % EX SOLN: 1.0000 [drp] | Freq: Every day | CUTANEOUS | 11 refills | Status: AC

## 2024-10-31 NOTE — Progress Notes (Signed)
 "  Subjective:  Patient ID: Linda Wilson, female    DOB: [birth year],  MRN: [id number]  Chief Complaint  Patient presents with   Nail Problem    RM 10 Referral from urgent care and also want to talk about nail fungus on big toe, Here to f/u on right foot pain states a couple of weeks ago she step off wrong and foot has been bothering here off and on, was seen at urgent care on 1/23 for this issue, had x-rays done and was told she may have a possible ligament tear was given boot to wear and was told to follow up with podiatry.    Discussed the use of AI scribe software for clinical note transcription with the patient, who gave verbal consent to proceed.  History of Present Illness Linda Wilson is a 54 year old female who presents with right foot pain and right hallux nail changes following injury.  A few weeks prior to presentation (approximately 10/12/2024), she sustained an injury to her right foot after stepping off a curb incorrectly. She experienced pain localized to the dorsal and lateral aspects of the right foot, initially described as painful with ambulation. The pain has been variable, with intermittent improvement, and is currently characterized by tightness and an abnormal sensation. She notes mild soreness in the same area, occasional mild pain with plantar flexion and eversion, and a sensation of pulling with certain movements. She denies prior injuries or issues with the right foot. X-rays performed at urgent care on 10/18/2024 suggested a possible torn ligament or small avulsion fracture. She uses ibuprofen  as needed for pain, but only when symptoms are bothersome.  She reports new changes to the right hallux nail, first noticed after removal of nail polish at the end of summer. She observed an abnormal appearance of the nail and is concerned about possible fungal infection. She denies prior history of nail fungus. The abnormality is limited to the right hallux, with no involvement  of other nails, and there is no thickening of the nail plate. She has not used any prior treatments for the nail changes.  She denies history of liver or kidney disease and does not drink alcohol regularly.      Objective:    Physical Exam VASCULAR: DP and PT pulse palpable. Foot is warm and well-perfused. Capillary fill time is brisk. DERMATOLOGIC: Normal skin turgor, texture, and temperature. No open lesions, rashes, or ulcerations. Onychomycosis of right hallux nail distal medial quadrant, no thickening of nail plate. NEUROLOGIC: Normal sensation to light touch and pressure. No paresthesias on examination. ORTHOPEDIC: Smooth pain-free range of motion of all examined joints. No ecchymosis or bruising. No gross deformity. Mild pain to palpation along peroneus brevis near fifth metatarsal insertion. Full 5/5 strength without difficulty, mild tenderness on plantar flexion and eversion.       Results Radiology Right foot radiograph (10/18/2024): Possible small avulsion fracture at the cuboid-fifth metatarsal articulation and/or peroneus brevis insertion (Independently interpreted)   Assessment:     ICD-10-CM   1. Closed avulsion fracture of metatarsal bone of right foot, initial encounter  S92.301A     2. Onychomycosis  B35.1         Plan:  Patient was evaluated and treated and all questions answered.  Assessment and Plan Assessment & Plan Right foot avulsion fracture and ligamentous injury Subacute right foot injury with mild pain and tightness, likely due to a small avulsion fracture at the cuboid/fifth metatarsal joint and/or peroneus brevis  insertion. Examination revealed mild tenderness, full strength, and no significant functional limitation. Symptoms are improving, and she is now comfortable in regular footwear. No evidence of severe tendon injury. Low risk of persistent pain or tendon tear; MRI may be indicated if symptoms persist. - Reviewed urgent care radiographs  showing possible small avulsion fracture. - Performed physical examination to assess pain, strength, and function. - Advised discontinuation of walking boot as she is comfortable in regular shoes. - Provided home exercise instructions to strengthen the peroneal tendon. - Advised avoidance of footwear lacking ankle support during recovery. - Instructed to monitor symptoms and contact clinic if pain persists beyond one month for possible MRI to rule out tendon tear.  Onychomycosis of right hallux nail Distal medial onychomycosis of the right hallux nail without nail plate thickening. First episode, limited involvement, and no prior fungal nail infection. No evidence of systemic illness or contraindications to antifungal therapy. Oral terbinafine  offers higher success rates than topical therapy, with low risk of gastrointestinal side effects and rare risk of myalgias when combined with statins, not a concern at her current dose. Topical Jublia  is an alternative if affordable, requiring consistent daily application for efficacy. - Discussed topical (Jublia , Penlac) and oral (terbinafine ) antifungal treatment options, including success rates: Penlac 60-70%, oral terbinafine  80-90%. - Reviewed risks of oral terbinafine , including low risk of gastrointestinal side effects and rare risk of myalgias with statin use. - Discussed need for consistent daily application of topical therapy for efficacy. - Reviewed duration of therapy: oral terbinafine  for 3 months, topical Jublia  for at least 6 months. - Ordered oral terbinafine  (Lamisil ) 90-day supply to local pharmacy. - Ordered topical Jublia  to [facility] with copay program; instructed to use if affordable, otherwise to use oral therapy alone. - Obtained photograph of affected toenail for documentation. - Instructed to call if any issues arise with medications or if unable to obtain topical therapy affordably.      Return in about 4 months (around  02/28/2025) for follow up after nail fungus treatment.   "

## 2024-10-31 NOTE — Patient Instructions (Addendum)
 " VISIT SUMMARY: During your visit, we discussed your right foot pain and nail changes following an injury. We reviewed your symptoms, performed a physical examination, and discussed treatment options for both your foot injury and nail condition.  YOUR PLAN: -RIGHT FOOT AVULSION FRACTURE AND LIGAMENTOUS INJURY: You have a small avulsion fracture and ligament injury in your right foot, which is causing mild pain and tightness. An avulsion fracture occurs when a small piece of bone is pulled off by a tendon or ligament. We reviewed your X-rays and performed a physical examination. You can stop using the walking boot since you are comfortable in regular shoes. I provided you with home exercises to strengthen your peroneal tendon and advised you to avoid footwear that lacks ankle support during your recovery. Monitor your symptoms, and if the pain persists beyond one month, contact the clinic for a possible MRI to rule out a tendon tear.  -ONYCHOMYCOSIS OF RIGHT HALLUX NAIL: You have a fungal infection in your right big toenail, known as onychomycosis. This condition causes changes in the appearance of the nail. We discussed treatment options, including topical and oral antifungal medications. Oral terbinafine  has a higher success rate and is generally well-tolerated, but it may cause mild gastrointestinal side effects. Topical treatments like Jublia  require consistent daily application. We have ordered a 90-day supply of oral terbinafine  for you and also ordered topical Jublia  to your pharmacy. Use the topical treatment if it is affordable; otherwise, use the oral medication alone. Call the clinic if you have any issues with the medications or if you cannot obtain the topical therapy affordably.  INSTRUCTIONS: Monitor your right foot symptoms and contact the clinic if the pain persists beyond one month for a possible MRI. Use the oral terbinafine  as prescribed and apply the topical Jublia  if it is affordable.  Call the clinic if you experience any issues with the medications or if you cannot obtain the topical therapy affordably.    Contains text generated by [employer].   Peroneal Tendinopathy Rehab Ask your health care provider which exercises are safe for you. Do exercises exactly as told by your health care provider and adjust them as directed. It is normal to feel mild stretching, pulling, tightness, or discomfort as you do these exercises. Stop right away if you feel sudden pain or your pain gets worse. Do not begin these exercises until told by your health care provider. Stretching and range-of-motion exercises These exercises warm up your muscles and joints and improve the movement and flexibility of your ankle. These exercises also help to relieve pain and stiffness. Gastroc and soleus stretch, standing  This is an exercise in which you stand on a step and use your body weight to stretch your calf muscles. To do this exercise: Stand on the edge of a step on the ball of your left / right foot. The ball of your foot is on the walking surface, right under your toes. Keep your other foot firmly on the same step. Hold on to the wall, a railing, or a chair for balance. Slowly lift your other foot, allowing your body weight to press your left / right heel down over the edge of the step. You should feel a stretch in your left / right calf (gastrocnemius and soleus). Hold this position for 15 seconds. Return both feet to the step. Repeat this exercise with a slight bend in your left / right knee. Repeat 5 times with your left / right knee straight and 5  times with your left / right knee bent. Complete this exercise 2 times a day. Strengthening exercises These exercises build strength and endurance in your foot and ankle. Endurance is the ability to use your muscles for a long time, even after they get tired. Ankle dorsiflexion with band   Secure a rubber exercise band or tube to an object, such as a  table leg, that will not move when the band is pulled. Secure the other end of the band around your left / right foot. Sit on the floor, facing the object with your left / right leg extended. The band or tube should be slightly tense when your foot is relaxed. Slowly flex your left / right ankle and toes to bring your foot toward you (dorsiflexion). Hold this position for 15 seconds. Let the band or tube slowly pull your foot back to the starting position. Repeat 5 times. Complete this exercise 2 times a day. Ankle eversion Sit on the floor with your legs straight out in front of you. Loop a rubber exercise band or tube around the ball of your left / right foot. The ball of your foot is on the walking surface, right under your toes. Hold the ends of the band in your hands, or secure the band to a stable object. The band or tube should be slightly tense when your foot is relaxed. Slowly push your foot outward, away from your other leg (eversion). Hold this position for 15 seconds. Slowly return your foot to the starting position. Repeat 5 times. Complete this exercise 2 times a day. Plantar flexion, standing  This exercise is sometimes called standing heel raise. Stand with your feet shoulder-width apart. Place your hands on a wall or table to steady yourself as needed, but try not to use it for support. Keep your weight spread evenly over the width of your feet while you slowly rise up on your toes (plantar flexion). If told by your health care provider: Shift your weight toward your left / right leg until you feel challenged. Stand on your left / right leg only. Hold this position for 15 seconds. Repeat 2 times. Complete this exercise 2 times a day. Single leg stand Without shoes, stand near a railing or in a doorway. You may hold on to the railing or door frame as needed. Stand on your left / right foot. Keep your big toe down on the floor and try to keep your arch lifted. Do not roll  to the outside of your foot. If this exercise is too easy, you can try it with your eyes closed or while standing on a pillow. Hold this position for 15 seconds. Repeat 5 times. Complete this exercise 2 times a day. This information is not intended to replace advice given to you by your health care provider. Make sure you discuss any questions you have with your health care provider. Document Revised: 01/01/2019 Document Reviewed: 01/01/2019 [employer] Patient Education  2020 [employer].  "

## 2024-11-01 ENCOUNTER — Other Ambulatory Visit: Payer: Self-pay

## 2024-12-10 ENCOUNTER — Ambulatory Visit: Admitting: Family

## 2024-12-17 ENCOUNTER — Ambulatory Visit: Payer: Self-pay | Admitting: Family

## 2025-03-13 ENCOUNTER — Ambulatory Visit: Admitting: Podiatry
